# Patient Record
Sex: Male | Born: 1940 | Race: Black or African American | Hispanic: No | Marital: Married | Smoking: Former smoker
Health system: Southern US, Community
[De-identification: ages and names within clinical notes are randomized; demographics above are authoritative.]

## PROBLEM LIST (undated history)

## (undated) DIAGNOSIS — I10 Essential (primary) hypertension: Secondary | ICD-10-CM

## (undated) DIAGNOSIS — N400 Enlarged prostate without lower urinary tract symptoms: Secondary | ICD-10-CM

## (undated) DIAGNOSIS — Z9889 Other specified postprocedural states: Secondary | ICD-10-CM

## (undated) DIAGNOSIS — Z87891 Personal history of nicotine dependence: Secondary | ICD-10-CM

## (undated) DIAGNOSIS — M1990 Unspecified osteoarthritis, unspecified site: Secondary | ICD-10-CM

## (undated) DIAGNOSIS — D075 Carcinoma in situ of prostate: Secondary | ICD-10-CM

## (undated) HISTORY — DX: Benign prostatic hyperplasia without lower urinary tract symptoms: N40.0

## (undated) HISTORY — DX: Personal history of nicotine dependence: Z87.891

## (undated) HISTORY — DX: Other specified postprocedural states: Z98.890

## (undated) HISTORY — PX: HEMORROIDECTOMY: SUR656

## (undated) HISTORY — PX: HERNIA REPAIR: SHX51

## (undated) HISTORY — DX: Carcinoma in situ of prostate: D07.5

## (undated) HISTORY — PX: JOINT REPLACEMENT: SHX530

## (undated) HISTORY — DX: Unspecified osteoarthritis, unspecified site: M19.90

## (undated) HISTORY — PX: TOTAL HIP ARTHROPLASTY: SHX124

## (undated) HISTORY — DX: Essential (primary) hypertension: I10

---

## 2005-12-08 ENCOUNTER — Emergency Department: Payer: Self-pay | Admitting: Emergency Medicine

## 2008-06-10 ENCOUNTER — Ambulatory Visit: Payer: Self-pay | Admitting: Internal Medicine

## 2008-06-22 ENCOUNTER — Ambulatory Visit: Payer: Self-pay | Admitting: Internal Medicine

## 2008-07-11 ENCOUNTER — Ambulatory Visit: Payer: Self-pay | Admitting: Internal Medicine

## 2008-07-24 ENCOUNTER — Ambulatory Visit: Payer: Self-pay | Admitting: Gastroenterology

## 2008-08-11 ENCOUNTER — Ambulatory Visit: Payer: Self-pay | Admitting: Internal Medicine

## 2008-09-08 ENCOUNTER — Ambulatory Visit: Payer: Self-pay | Admitting: Internal Medicine

## 2008-09-10 ENCOUNTER — Ambulatory Visit: Payer: Self-pay | Admitting: Internal Medicine

## 2008-10-10 ENCOUNTER — Ambulatory Visit: Payer: Self-pay | Admitting: Internal Medicine

## 2009-02-08 ENCOUNTER — Ambulatory Visit: Payer: Self-pay | Admitting: Internal Medicine

## 2009-02-22 ENCOUNTER — Ambulatory Visit: Payer: Self-pay | Admitting: Internal Medicine

## 2009-03-10 ENCOUNTER — Ambulatory Visit: Payer: Self-pay | Admitting: Internal Medicine

## 2009-03-22 ENCOUNTER — Ambulatory Visit: Payer: Self-pay | Admitting: Internal Medicine

## 2009-04-10 ENCOUNTER — Ambulatory Visit: Payer: Self-pay | Admitting: Internal Medicine

## 2009-08-30 ENCOUNTER — Emergency Department: Payer: Self-pay | Admitting: Unknown Physician Specialty

## 2010-06-25 ENCOUNTER — Emergency Department: Payer: Self-pay | Admitting: Emergency Medicine

## 2011-11-11 DIAGNOSIS — Z9889 Other specified postprocedural states: Secondary | ICD-10-CM

## 2011-11-11 DIAGNOSIS — D075 Carcinoma in situ of prostate: Secondary | ICD-10-CM

## 2011-11-11 HISTORY — PX: PROSTATE BIOPSY: SHX241

## 2011-11-11 HISTORY — DX: Other specified postprocedural states: Z98.890

## 2011-11-11 HISTORY — DX: Carcinoma in situ of prostate: D07.5

## 2012-07-07 DIAGNOSIS — N401 Enlarged prostate with lower urinary tract symptoms: Secondary | ICD-10-CM | POA: Insufficient documentation

## 2012-07-07 DIAGNOSIS — R972 Elevated prostate specific antigen [PSA]: Secondary | ICD-10-CM | POA: Insufficient documentation

## 2012-07-30 ENCOUNTER — Emergency Department: Payer: Self-pay | Admitting: Internal Medicine

## 2012-07-30 LAB — COMPREHENSIVE METABOLIC PANEL
Albumin: 3.6 g/dL (ref 3.4–5.0)
Alkaline Phosphatase: 104 U/L (ref 50–136)
Anion Gap: 8 (ref 7–16)
Calcium, Total: 9 mg/dL (ref 8.5–10.1)
Co2: 27 mmol/L (ref 21–32)
Creatinine: 1.35 mg/dL — ABNORMAL HIGH (ref 0.60–1.30)
EGFR (African American): >60
EGFR (Non-African Amer.): 52 — ABNORMAL LOW
SGOT(AST): 29 U/L (ref 15–37)
Sodium: 137 mmol/L (ref 136–145)
Total Protein: 7.3 g/dL (ref 6.4–8.2)

## 2012-07-30 LAB — CBC
HCT: 38.4 % — ABNORMAL LOW (ref 40.0–52.0)
HGB: 13.1 g/dL (ref 13.0–18.0)
RBC: 4.23 10*6/uL — ABNORMAL LOW (ref 4.40–5.90)
WBC: 3 10*3/uL — ABNORMAL LOW (ref 3.8–10.6)

## 2012-07-30 LAB — TROPONIN I: Troponin-I: <0.02 ng/mL

## 2012-08-06 DIAGNOSIS — D075 Carcinoma in situ of prostate: Secondary | ICD-10-CM | POA: Insufficient documentation

## 2012-09-03 DIAGNOSIS — IMO0001 Reserved for inherently not codable concepts without codable children: Secondary | ICD-10-CM | POA: Insufficient documentation

## 2012-09-03 DIAGNOSIS — Z0189 Encounter for other specified special examinations: Secondary | ICD-10-CM | POA: Insufficient documentation

## 2012-09-03 DIAGNOSIS — D709 Neutropenia, unspecified: Secondary | ICD-10-CM | POA: Insufficient documentation

## 2013-04-11 ENCOUNTER — Ambulatory Visit: Payer: Self-pay | Admitting: Orthopedic Surgery

## 2013-04-11 DIAGNOSIS — Z01810 Encounter for preprocedural cardiovascular examination: Secondary | ICD-10-CM

## 2013-04-11 LAB — BASIC METABOLIC PANEL
BUN: 15 mg/dL (ref 7–18)
Calcium, Total: 8.8 mg/dL (ref 8.5–10.1)
Co2: 31 mmol/L (ref 21–32)
EGFR (Non-African Amer.): >60
Glucose: 135 mg/dL — ABNORMAL HIGH (ref 65–99)
Osmolality: 279 (ref 275–301)
Sodium: 138 mmol/L (ref 136–145)

## 2013-04-11 LAB — URINALYSIS, COMPLETE
Bacteria: NONE SEEN
Bilirubin,UR: NEGATIVE
Blood: NEGATIVE
Glucose,UR: NEGATIVE mg/dL (ref 0–75)
Ketone: NEGATIVE
Leukocyte Esterase: NEGATIVE
Nitrite: NEGATIVE
Protein: NEGATIVE
Squamous Epithelial: <1

## 2013-04-11 LAB — MRSA PCR SCREENING

## 2013-04-11 LAB — CBC
HCT: 39.8 % — ABNORMAL LOW (ref 40.0–52.0)
HGB: 14 g/dL (ref 13.0–18.0)
MCHC: 35.1 g/dL (ref 32.0–36.0)
MCV: 90 fL (ref 80–100)
Platelet: 219 10*3/uL (ref 150–440)
RBC: 4.43 10*6/uL (ref 4.40–5.90)
RDW: 13.8 % (ref 11.5–14.5)

## 2013-04-11 LAB — PROTIME-INR
INR: 1
Prothrombin Time: 13.2 secs (ref 11.5–14.7)

## 2013-04-28 ENCOUNTER — Inpatient Hospital Stay: Payer: Self-pay | Admitting: Orthopedic Surgery

## 2013-04-28 LAB — POTASSIUM: Potassium: 3.6 mmol/L (ref 3.5–5.1)

## 2013-04-29 LAB — BASIC METABOLIC PANEL
Anion Gap: 5 — ABNORMAL LOW (ref 7–16)
BUN: 11 mg/dL (ref 7–18)
Calcium, Total: 8.2 mg/dL — ABNORMAL LOW (ref 8.5–10.1)
Creatinine: 0.83 mg/dL (ref 0.60–1.30)
EGFR (African American): >60
Sodium: 136 mmol/L (ref 136–145)

## 2013-05-02 LAB — PATHOLOGY REPORT

## 2013-07-12 ENCOUNTER — Ambulatory Visit: Payer: Self-pay | Admitting: Urology

## 2013-07-18 DIAGNOSIS — R053 Chronic cough: Secondary | ICD-10-CM | POA: Insufficient documentation

## 2013-07-18 DIAGNOSIS — R05 Cough: Secondary | ICD-10-CM | POA: Insufficient documentation

## 2013-07-18 DIAGNOSIS — R9389 Abnormal findings on diagnostic imaging of other specified body structures: Secondary | ICD-10-CM | POA: Insufficient documentation

## 2015-03-02 NOTE — Op Note (Signed)
PATIENT NAME:  [name] Foster, [name] Foster MR#:  [id number] DATE OF BIRTH:  02-03-1941  DATE OF PROCEDURE:  04/28/2013  PREOPERATIVE DIAGNOSIS: Severe left hip osteoarthritis.   POSTOPERATIVE DIAGNOSIS: Severe left hip osteoarthritis.   PROCEDURE: Left total hip replacement.   ANESTHESIA: Spinal.   SURGEON: Laurene Footman, M.D.   ASSISTANT:  Francena Hanly, Nurse Practitioner.   DESCRIPTION OF PROCEDURE: Patient was brought to the operating room and after adequate anesthesia was obtained, the patient was transferred to the operative table with the left foot in the Medacta attachment, right leg in the well legholder. C-arm was brought in and initial x-ray of the left hip obtained with slight traction. The hip was then prepped and draped in the usual sterile fashion with anterior approach being utilized, centered over the tensor fascia lata muscle. After patient identification and timeout procedures were completed. Skin incision was made tensor fascia was incised and the muscle retracted laterally. The anterior circumflex vessels were tied off and the rectus retracted medially. The anterior capsule is then opened with a flap and the femoral neck was cut. The hip was quite stiff and after getting the head out, there was a significant degenerative changes present. Labrum excised along with some inferior spurs. Sequential reaming was carried out to 56 mm at which point there was a good bleeding tissue in the acetabulum. The 56 trial fit well. The 56 cup was impacted and subsequently was readjusted as it appeared somewhat a vertical on x-ray. Making some fine adjustments, good position was obtained. Next, the posterior capsule was quite tight and release of the pubofemoral and ischiofemoral ligaments was carried out to allow for external rotation and then extension of the leg with abduction. A box osteotome and curette were used to start opening the canal followed by broaches and the #3 stem gave a very nice fit on  x-ray and on exam. Trials were placed and with the short head, leg lengths appeared to be similar to the preop. The final stem was then impacted and repeat trial carried out and the final components assembled and impacted. X-ray at this point showed very good limb alignment with appropriate offset and component position. The wound was thoroughly irrigated. The capsule was repaired using Ethibond suture and heavy quill for the deep tissue with local anesthetic being infiltrated 0.5% Sensorcaine. A subcutaneous drain followed by 2 - 0 quill and skin staples. Xeroform, 4 x 4's, ABD and tape applied. The patient was sent to the recovery room in stable condition.   ESTIMATED BLOOD LOSS: 700.   COMPLICATIONS: None.   SPECIMENS: Femoral head.   IMPLANTS: Medacta AMIS stem size 3 standard, a 56 mm VersaFit cup DM with appropriate DM liner and a size S 28 mm femoral head.     ____________________________ Laurene Footman, MD mjm:cc D: 04/28/2013 17:37:07 ET T: 04/28/2013 20:16:28 ET JOB#: [id number]  cc: Laurene Footman, MD, <Dictator> Laurene Footman MD ELECTRONICALLY SIGNED 04/29/2013 6:34

## 2015-03-02 NOTE — Discharge Summary (Signed)
PATIENT NAME:  [name] Foster, [name] Foster MR#:  [id number] DATE OF BIRTH:  November 10, 1941  DATE OF ADMISSION:  04/28/2013 DATE OF DISCHARGE:  05/01/2013  ADMITTING DIAGNOSIS: Left hip degenerative arthritis.   DISCHARGE DIAGNOSIS: Left hip degenerative arthritis.   OPERATION: On 04/28/2013, he had a left total hip arthroplasty.   SURGEON: Dr. Hessie Knows.   ANESTHESIA: Spinal.   ESTIMATED BLOOD LOSS: 700 mL.   DRAINS: Hemovac.  IMPLANTS:  Medacta 3 AMIS stem, 56 mm Versafit DM cup and liner, S 28 mm head.   COMPLICATIONS: None.   HISTORY: [name] Foster is a 74 year old African American male who failed conservative measures for treatment of left degenerative arthritis. He elected to proceed with a left total hip arthroplasty.   PHYSICAL EXAMINATION: LUNGS: Clear to auscultation.  HEART: Regular rate and rhythm.  HEENT: Normal.  MUSCULOSKELETAL: 0 degree internal rotation, 10 degree external rotation of the hip with slight flexion contracture. Distally, he is neurovascularly intact with strong pulses and no edema.   HOSPITAL COURSE: After initial admission on 04/28/2013, he underwent a left total hip arthroplasty, anterior approach, the same day. He had good pain control afterwards and was brought to the orthopedic floor from the PACU. His initial hemoglobin on postoperative day one, 04/29/2013, was 10.7. Physical therapy was begun on that day and he actually was able to ambulate 200 feet that day with little pain. On postoperative day two, 04/30/2013, he continued to progress well with physical therapy. He had not had a bowel movement yet. His daughter was present and did not want him being discharged because he had a problem with pain control initially. Decision was made to keep him 1 more night for additional physical therapy sessions and pain control. On postoperative day three, 05/01/2013, he was stable and ready for discharge.   CONDITION AT DISCHARGE: Stable.   DISPOSITION: The patient  was sent to rehab.   DISCHARGE INSTRUCTIONS: The patient will follow up in [facility] orthopedics in 2 weeks for staple removal. He will do home health physical therapy and may be weight-bearing as tolerated on the left lower extremity. TED hose knee-high bilaterally. Dressing can be changed once daily and on an as-needed basis.   DISCHARGE MEDICATIONS: 1.  Doxazosin 4 mg oral tablet 1 tab p.o. once daily.  2.  Finasteride 5 mg 1 tab p.o. once daily.  3.  Centrum Silver multivitamin 1 tab p.o. once daily. 4.  Acetaminophen 500 mg 1 tab p.o. q. 4 hours p.r.n. pain or temperature greater than 100.4.  5.  Oxycodone 5 mg 1 tab p.o. q. 4 hours p.r.n. pain.  6.  Tramadol 50 mg 1 tab p.o. q. 8 hours p.r.n. pain.  7.  Magnesium hydroxide 8% oral suspension 30 mL p.o. b.i.d. p.r.n. constipation.  8.  Aluminum hydroxide/magnesium hydroxide/simethicone 400/400/40/5 mL 30 mL p.o. q. 6 hours p.r.n. indigestion or heartburn.  9.  Rivaroxaban 10 mg 1 tab p.o. q. a.m. x 9 days.  10.  Docusate/senna 50 mg/8.6 mg 1 tab p.o. b.i.d.  11.  Bisacodyl 10 mg rectal suppository 1 suppository once daily p.r.n. constipation. ____________________________ Yusef Lamp M. Tretha Sciara, NP amb:sb D: 05/02/2013 08:45:47 ET T: 05/02/2013 09:29:30 ET JOB#: [id number]  cc: Adena Sima M. Tretha Sciara, NP, <Dictator> Kem Kays Ayriel Texidor FNP ELECTRONICALLY SIGNED 05/17/2013 13:05

## 2015-04-25 DIAGNOSIS — Z125 Encounter for screening for malignant neoplasm of prostate: Secondary | ICD-10-CM | POA: Diagnosis not present

## 2015-04-25 DIAGNOSIS — N401 Enlarged prostate with lower urinary tract symptoms: Secondary | ICD-10-CM | POA: Diagnosis not present

## 2015-04-25 DIAGNOSIS — Z0000 Encounter for general adult medical examination without abnormal findings: Secondary | ICD-10-CM | POA: Diagnosis not present

## 2015-04-27 DIAGNOSIS — Z0000 Encounter for general adult medical examination without abnormal findings: Secondary | ICD-10-CM | POA: Diagnosis not present

## 2015-04-27 DIAGNOSIS — Z125 Encounter for screening for malignant neoplasm of prostate: Secondary | ICD-10-CM | POA: Diagnosis not present

## 2015-05-30 DIAGNOSIS — Z6829 Body mass index (BMI) 29.0-29.9, adult: Secondary | ICD-10-CM | POA: Diagnosis not present

## 2015-05-30 DIAGNOSIS — D075 Carcinoma in situ of prostate: Secondary | ICD-10-CM | POA: Diagnosis not present

## 2015-05-30 DIAGNOSIS — R339 Retention of urine, unspecified: Secondary | ICD-10-CM | POA: Diagnosis not present

## 2015-05-30 DIAGNOSIS — N401 Enlarged prostate with lower urinary tract symptoms: Secondary | ICD-10-CM | POA: Diagnosis not present

## 2015-05-30 DIAGNOSIS — R972 Elevated prostate specific antigen [PSA]: Secondary | ICD-10-CM | POA: Diagnosis not present

## 2015-10-29 DIAGNOSIS — N529 Male erectile dysfunction, unspecified: Secondary | ICD-10-CM | POA: Insufficient documentation

## 2015-10-29 DIAGNOSIS — N401 Enlarged prostate with lower urinary tract symptoms: Secondary | ICD-10-CM | POA: Diagnosis not present

## 2015-11-15 ENCOUNTER — Ambulatory Visit
Admission: EM | Admit: 2015-11-15 | Discharge: 2015-11-15 | Disposition: A | Discharge status: Home / Self Care | Payer: Commercial Managed Care - HMO | Attending: Family Medicine | Admitting: Family Medicine

## 2015-11-15 ENCOUNTER — Encounter: Payer: Self-pay | Admitting: Emergency Medicine

## 2015-11-15 DIAGNOSIS — Z024 Encounter for examination for driving license: Secondary | ICD-10-CM

## 2015-11-15 DIAGNOSIS — Z029 Encounter for administrative examinations, unspecified: Secondary | ICD-10-CM

## 2015-11-15 LAB — DEPT OF TRANSP DIPSTICK, URINE (ARMC ONLY)
Glucose, UA: NEGATIVE mg/dL
Hgb urine dipstick: NEGATIVE
PROTEIN: NEGATIVE mg/dL
Specific Gravity, Urine: 1.02 (ref 1.005–1.030)

## 2015-11-15 NOTE — ED Notes (Signed)
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

## 2015-11-15 NOTE — ED Provider Notes (Signed)
CSN: [id number]     Arrival date & time 11/15/15  [id number] History   First MD Initiated Contact with Patient 11/15/15 1028     Chief Complaint  Patient presents with  . 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 He surprisingly is in very good shape and has BPH on medications. He denies all other medical problems including cardiac, blood pressure issues, obstructive sleep apnea, excessive sleep etc. He does wear glasses for driving.  No past medical history on file. No past surgical history on file. History reviewed. No pertinent family history. Social History  Substance Use Topics  . Smoking status: Never Smoker   . Smokeless tobacco: None  . Alcohol Use: No    Review of Systems  Allergies  Review of patient's allergies indicates no known allergies.  Home Medications   Prior to Admission medications   Medication Sig Start Date End Date Taking? Authorizing Provider  doxazosin (CARDURA) 4 MG tablet Take 4 mg by mouth daily.   Yes Historical Provider, MD  finasteride (PROSCAR) 5 MG tablet Take 5 mg by mouth daily.   Yes Historical Provider, MD  multivitamin-iron-minerals-folic acid (CENTRUM) chewable tablet Chew 1 tablet by mouth daily.   Yes Historical Provider, MD   Meds Ordered and Administered this Visit  Medications - No data to display  BP 139/82 mmHg  Pulse 75  Temp(Src) 96.8 F (36 C) (Tympanic)  Resp 16  Ht 5' 8.5" (1.74 m)  Wt 199 lb 4.8 oz (90.402 kg)  BMI 29.86 kg/m2  SpO2 99% No data found.   Physical Exam  ED Course  Procedures (including critical care time)  Labs Review Labs Reviewed  DEPT OF TRANSP DIPSTICK, URINE([facility] ONLY)    Imaging Review No results  found.   Visual Acuity Review  Right Eye Distance:   Left Eye Distance:   Bilateral Distance:    Right Eye Near:   Left Eye Near:    Bilateral Near:         MDM   1. Driver's permit physical examination        Lorin Picket, PA-C 11/15/15 1054

## 2015-12-21 DIAGNOSIS — N401 Enlarged prostate with lower urinary tract symptoms: Secondary | ICD-10-CM | POA: Diagnosis not present

## 2015-12-21 DIAGNOSIS — R972 Elevated prostate specific antigen [PSA]: Secondary | ICD-10-CM | POA: Diagnosis not present

## 2015-12-21 DIAGNOSIS — Z6829 Body mass index (BMI) 29.0-29.9, adult: Secondary | ICD-10-CM | POA: Diagnosis not present

## 2015-12-21 DIAGNOSIS — D075 Carcinoma in situ of prostate: Secondary | ICD-10-CM | POA: Diagnosis not present

## 2016-04-28 DIAGNOSIS — Z125 Encounter for screening for malignant neoplasm of prostate: Secondary | ICD-10-CM | POA: Diagnosis not present

## 2016-04-28 DIAGNOSIS — Z0000 Encounter for general adult medical examination without abnormal findings: Secondary | ICD-10-CM | POA: Diagnosis not present

## 2016-07-31 DIAGNOSIS — R972 Elevated prostate specific antigen [PSA]: Secondary | ICD-10-CM | POA: Diagnosis not present

## 2016-07-31 DIAGNOSIS — N401 Enlarged prostate with lower urinary tract symptoms: Secondary | ICD-10-CM | POA: Diagnosis not present

## 2016-07-31 DIAGNOSIS — D075 Carcinoma in situ of prostate: Secondary | ICD-10-CM | POA: Diagnosis not present

## 2016-07-31 DIAGNOSIS — Z6829 Body mass index (BMI) 29.0-29.9, adult: Secondary | ICD-10-CM | POA: Diagnosis not present

## 2016-08-25 DIAGNOSIS — R69 Illness, unspecified: Secondary | ICD-10-CM | POA: Diagnosis not present

## 2016-10-28 DIAGNOSIS — K5900 Constipation, unspecified: Secondary | ICD-10-CM | POA: Diagnosis not present

## 2016-10-28 DIAGNOSIS — H2513 Age-related nuclear cataract, bilateral: Secondary | ICD-10-CM | POA: Diagnosis not present

## 2016-10-28 DIAGNOSIS — Z125 Encounter for screening for malignant neoplasm of prostate: Secondary | ICD-10-CM | POA: Diagnosis not present

## 2017-02-03 DIAGNOSIS — R972 Elevated prostate specific antigen [PSA]: Secondary | ICD-10-CM | POA: Diagnosis not present

## 2017-02-03 DIAGNOSIS — R339 Retention of urine, unspecified: Secondary | ICD-10-CM | POA: Insufficient documentation

## 2017-02-03 DIAGNOSIS — Z6829 Body mass index (BMI) 29.0-29.9, adult: Secondary | ICD-10-CM | POA: Diagnosis not present

## 2017-02-03 DIAGNOSIS — D075 Carcinoma in situ of prostate: Secondary | ICD-10-CM | POA: Diagnosis not present

## 2017-02-03 DIAGNOSIS — N401 Enlarged prostate with lower urinary tract symptoms: Secondary | ICD-10-CM | POA: Diagnosis not present

## 2017-05-05 DIAGNOSIS — Z79899 Other long term (current) drug therapy: Secondary | ICD-10-CM | POA: Diagnosis not present

## 2017-05-05 DIAGNOSIS — Z862 Personal history of diseases of the blood and blood-forming organs and certain disorders involving the immune mechanism: Secondary | ICD-10-CM | POA: Diagnosis not present

## 2017-05-05 DIAGNOSIS — Z87898 Personal history of other specified conditions: Secondary | ICD-10-CM | POA: Diagnosis not present

## 2017-05-05 DIAGNOSIS — M25512 Pain in left shoulder: Secondary | ICD-10-CM | POA: Diagnosis not present

## 2017-05-05 DIAGNOSIS — Z13220 Encounter for screening for lipoid disorders: Secondary | ICD-10-CM | POA: Diagnosis not present

## 2017-05-05 DIAGNOSIS — Z0000 Encounter for general adult medical examination without abnormal findings: Secondary | ICD-10-CM | POA: Diagnosis not present

## 2017-05-05 DIAGNOSIS — D709 Neutropenia, unspecified: Secondary | ICD-10-CM | POA: Diagnosis not present

## 2017-05-05 DIAGNOSIS — N401 Enlarged prostate with lower urinary tract symptoms: Secondary | ICD-10-CM | POA: Diagnosis not present

## 2017-05-05 DIAGNOSIS — L989 Disorder of the skin and subcutaneous tissue, unspecified: Secondary | ICD-10-CM | POA: Diagnosis not present

## 2017-05-14 DIAGNOSIS — Z0000 Encounter for general adult medical examination without abnormal findings: Secondary | ICD-10-CM | POA: Diagnosis not present

## 2017-05-14 DIAGNOSIS — Z125 Encounter for screening for malignant neoplasm of prostate: Secondary | ICD-10-CM | POA: Diagnosis not present

## 2017-05-14 DIAGNOSIS — Z862 Personal history of diseases of the blood and blood-forming organs and certain disorders involving the immune mechanism: Secondary | ICD-10-CM | POA: Diagnosis not present

## 2017-05-14 DIAGNOSIS — Z87898 Personal history of other specified conditions: Secondary | ICD-10-CM | POA: Diagnosis not present

## 2017-05-14 DIAGNOSIS — Z13220 Encounter for screening for lipoid disorders: Secondary | ICD-10-CM | POA: Diagnosis not present

## 2017-05-14 DIAGNOSIS — Z79899 Other long term (current) drug therapy: Secondary | ICD-10-CM | POA: Diagnosis not present

## 2017-06-04 ENCOUNTER — Inpatient Hospital Stay: Payer: Medicare HMO

## 2017-06-04 ENCOUNTER — Inpatient Hospital Stay: Payer: Medicare HMO | Attending: Oncology | Admitting: Oncology

## 2017-06-04 ENCOUNTER — Encounter: Payer: Self-pay | Admitting: Oncology

## 2017-06-04 VITALS — BP 133/79 | HR 67 | Temp 97.3°F | Resp 16 | Wt 190.7 lb

## 2017-06-04 DIAGNOSIS — D709 Neutropenia, unspecified: Secondary | ICD-10-CM | POA: Insufficient documentation

## 2017-06-04 DIAGNOSIS — M25512 Pain in left shoulder: Secondary | ICD-10-CM | POA: Insufficient documentation

## 2017-06-04 DIAGNOSIS — N400 Enlarged prostate without lower urinary tract symptoms: Secondary | ICD-10-CM | POA: Insufficient documentation

## 2017-06-04 DIAGNOSIS — Z87891 Personal history of nicotine dependence: Secondary | ICD-10-CM | POA: Insufficient documentation

## 2017-06-04 DIAGNOSIS — Z8042 Family history of malignant neoplasm of prostate: Secondary | ICD-10-CM | POA: Diagnosis not present

## 2017-06-04 DIAGNOSIS — Z79899 Other long term (current) drug therapy: Secondary | ICD-10-CM | POA: Insufficient documentation

## 2017-06-04 LAB — COMPREHENSIVE METABOLIC PANEL
ALT: 27 U/L (ref 17–63)
AST: 33 U/L (ref 15–41)
Albumin: 4.3 g/dL (ref 3.5–5.0)
Alkaline Phosphatase: 109 U/L (ref 38–126)
Anion gap: 8 (ref 5–15)
BILIRUBIN TOTAL: 0.8 mg/dL (ref 0.3–1.2)
BUN: 15 mg/dL (ref 6–20)
CO2: 27 mmol/L (ref 22–32)
CREATININE: 0.97 mg/dL (ref 0.61–1.24)
Calcium: 9.3 mg/dL (ref 8.9–10.3)
Chloride: 100 mmol/L — ABNORMAL LOW (ref 101–111)
Glucose, Bld: 77 mg/dL (ref 65–99)
POTASSIUM: 3.9 mmol/L (ref 3.5–5.1)
Sodium: 135 mmol/L (ref 135–145)
TOTAL PROTEIN: 8.1 g/dL (ref 6.5–8.1)

## 2017-06-04 LAB — CBC WITH DIFFERENTIAL/PLATELET
BASOS ABS: 0 10*3/uL (ref 0–0.1)
Basophils Relative: 1 %
Eosinophils Absolute: 0.1 10*3/uL (ref 0–0.7)
Eosinophils Relative: 3 %
HEMATOCRIT: 39.3 % — AB (ref 40.0–52.0)
Hemoglobin: 13.3 g/dL (ref 13.0–18.0)
LYMPHS ABS: 1.4 10*3/uL (ref 1.0–3.6)
LYMPHS PCT: 46 %
MCH: 30.5 pg (ref 26.0–34.0)
MCHC: 33.9 g/dL (ref 32.0–36.0)
MCV: 90 fL (ref 80.0–100.0)
MONO ABS: 0.3 10*3/uL (ref 0.2–1.0)
MONOS PCT: 9 %
NEUTROS ABS: 1.3 10*3/uL — AB (ref 1.4–6.5)
Neutrophils Relative %: 41 %
Platelets: 226 10*3/uL (ref 150–440)
RBC: 4.36 MIL/uL — ABNORMAL LOW (ref 4.40–5.90)
RDW: 13.6 % (ref 11.5–14.5)
WBC: 3.1 10*3/uL — ABNORMAL LOW (ref 3.8–10.6)

## 2017-06-04 LAB — VITAMIN B12: VITAMIN B 12: 605 pg/mL (ref 180–914)

## 2017-06-04 LAB — TSH: TSH: 3.06 u[IU]/mL (ref 0.350–4.500)

## 2017-06-04 NOTE — Progress Notes (Signed)
[facility] Initial Visit:  Patient Care Team: Alanson Aly, [place] as PCP - General (Family Medicine)  CHIEF COMPLAINTS/PURPOSE OF CONSULTATION:   No history exists.    HISTORY OF PRESENTING ILLNESS: [name] Foster 76 y.o. 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  Patient was accompanied by his wife today. Patient denies any complaint he fails at his baseline health. Denies any night sweats or fever or weight loss. Denies any chest pain, shortness of breath, abdominal pain, or leg swelling. He denies any frequent upper respiratory infections. Patient had some recent labs done with [facility].  he has left shoulder pain for which he had seen PCP and was recommended to see orthopedic surgeon. patient declined. He was then advised to start shoulder exercise and be reassessed.  05/14/2017, hemoglobin 12.9 WBC 2.8 MCV 89.5 platelet 207. ANC 0.9Creatinine 1 bilirubin 0.6 AST 25 ALT 27, alkaline phosphatase In the past,his wbc has been chronically low, ranges from 12.9-13.3. ANC ranges from 0.9-1.9.  Review of Systems  Constitutional: Negative.   HENT:  Negative.   Eyes: Negative.   Respiratory: Negative.   Cardiovascular: Negative.   Gastrointestinal: Negative.   Endocrine: Negative.   Genitourinary: Negative.    Musculoskeletal: Positive for arthralgias.       Left shoulder pain.   Skin: Negative.   Neurological: Negative.   Hematological: Negative.   Psychiatric/Behavioral: Negative.     MEDICAL HISTORY: Past Medical History:  Diagnosis Date  . BPH (benign prostatic hyperplasia)   . Former smoker, stopped smoking many years ago   . History of needle biopsy of prostate with negative result 2013  . Prostatic intraepithelial neoplasm III 2013    SURGICAL HISTORY: Past Surgical History:  Procedure Laterality Date  . HEMORROIDECTOMY    . HERNIA REPAIR    . PROSTATE BIOPSY  2013   . TOTAL HIP ARTHROPLASTY Left     SOCIAL HISTORY: Social History   Social History  . Marital status: Married    Spouse name: N/A  . Number of children: N/A  . Years of education: N/A   Occupational History  . Not on file.   Social History Main Topics  . Smoking status: Former Smoker    Quit date: 06/04/1980  . Smokeless tobacco: Never Used  . Alcohol use No  . Drug use: No  . Sexual activity: Not on file   Other Topics Concern  . Not on file   Social History Narrative  . No narrative on file    FAMILY HISTORY Family History  Problem Relation Age of Onset  . Prostate cancer Father   . Prostate cancer Brother     ALLERGIES:  has No Known Allergies.  MEDICATIONS:  Current Outpatient Prescriptions  Medication Sig Dispense Refill  . Acetaminophen 500 MG coapsule     . doxazosin (CARDURA) 4 MG tablet Take 4 mg by mouth daily.    . finasteride (PROSCAR) 5 MG tablet Take 5 mg by mouth daily.    . multivitamin-iron-minerals-folic acid (CENTRUM) chewable tablet Chew 1 tablet by mouth daily.     No current facility-administered medications for this visit.     PHYSICAL EXAMINATION:  ECOG PERFORMANCE STATUS: 0 - Asymptomatic   Vitals:   06/04/17 1120  BP: 133/79  Pulse: 67  Resp: 16  Temp: (!) 97.3 F (36.3 C)    Filed Weights   06/04/17 1120  Weight: 190 lb  11.2 oz (86.5 kg)   Physical Exam GENERAL: No distress, well nourished.  SKIN:  No rashes or significant lesions  HEAD: Normocephalic, No masses, lesions, tenderness or abnormalities  EYES: Conjunctiva are pink, non icteric ENT: External ears normal ,lips , buccal mucosa, and tongue normal and mucous membranes are moist  LYMPH: No palpable cervical and axillary lymphadenopathy  LUNGS: Clear to auscultation, no crackles or wheezes HEART: Regular rate & rhythm, no murmurs, no gallops, S1 normal and S2 normal  ABDOMEN: Abdomen soft, non-tender, normal bowel sounds, I did not appreciate any  masses or  organomegaly  MUSCULOSKELETAL: No CVA tenderness and no tenderness on percussion of the back or rib cage.  EXTREMITIES: No edema, no skin discoloration or tenderness NEURO: Alert & oriented, no focal motor/sensory deficits.    LABORATORY DATA: I have personally reviewed the data as listed:  Appointment on 06/04/2017  Component Date Value Ref Range Status  . WBC 06/04/2017 3.1* 3.8 - 10.6 K/uL Final  . RBC 06/04/2017 4.36* 4.40 - 5.90 MIL/uL Final  . Hemoglobin 06/04/2017 13.3  13.0 - 18.0 g/dL Final  . HCT 06/04/2017 39.3* 40.0 - 52.0 % Final  . MCV 06/04/2017 90.0  80.0 - 100.0 fL Final  . MCH 06/04/2017 30.5  26.0 - 34.0 pg Final  . MCHC 06/04/2017 33.9  32.0 - 36.0 g/dL Final  . RDW 06/04/2017 13.6  11.5 - 14.5 % Final  . Platelets 06/04/2017 226  150 - 440 K/uL Final  . Neutrophils Relative % 06/04/2017 41  % Final  . Neutro Abs 06/04/2017 1.3* 1.4 - 6.5 K/uL Final  . Lymphocytes Relative 06/04/2017 46  % Final  . Lymphs Abs 06/04/2017 1.4  1.0 - 3.6 K/uL Final  . Monocytes Relative 06/04/2017 9  % Final  . Monocytes Absolute 06/04/2017 0.3  0.2 - 1.0 K/uL Final  . Eosinophils Relative 06/04/2017 3  % Final  . Eosinophils Absolute 06/04/2017 0.1  0 - 0.7 K/uL Final  . Basophils Relative 06/04/2017 1  % Final  . Basophils Absolute 06/04/2017 0.0  0 - 0.1 K/uL Final    RADIOGRAPHIC STUDIES: I have personally reviewed the radiological images as listed and agree with the findings in the report No recent image studies.  No results found.  ASSESSMENT/PLAN 1. Neutropenia, unspecified type ([place])    I discussed with patient and his wife that the differential diagnosis for neutropenia broad. I reviewed his medication. Doxazosin can cause neutropenia, but that's very rare, usually less than 1% chance.  Benign ethnic neutropenia is also possible given patient's ethnic group as African descent. We will have some lab work done to rule out other etiologies. We will check CBC with  differential, CMP, B12, TSH, pathology smear review, hepatitis panel, HIV antibody, ANA.  patient agrees with the plan. We will see patient back in 2 weeks to discuss lab results.  Orders Placed This Encounter  Procedures  . CBC with Differential/Platelet    Standing Status:   Future    Number of Occurrences:   1    Standing Expiration Date:   06/04/2018  . Comprehensive metabolic panel    Standing Status:   Future    Number of Occurrences:   1    Standing Expiration Date:   06/04/2018  . Vitamin B12    Standing Status:   Future    Number of Occurrences:   1    Standing Expiration Date:   07/05/2017  . TSH    Standing Status:  Future    Number of Occurrences:   1    Standing Expiration Date:   07/05/2017  . Hepatic function panel    Standing Status:   Future    Number of Occurrences:   1    Standing Expiration Date:   06/04/2018  . Pathologist smear review    Standing Status:   Future    Number of Occurrences:   1    Standing Expiration Date:   07/05/2017  . HIV antibody    Standing Status:   Future    Number of Occurrences:   1    Standing Expiration Date:   06/04/2018  . ANA w/Reflex if Positive    Standing Status:   Future    Number of Occurrences:   1    Standing Expiration Date:   06/04/2018    All questions were answered. The patient knows to call the clinic with any problems, questions or concerns. Thank you for this kind referral and the opportunity to participate in the care of this patient. A copy of today's note will be routed to referring physician Dr.Geyer.    Earlie Server, MD  06/04/2017 2:46 PM

## 2017-06-04 NOTE — Progress Notes (Signed)
Patient here today as a new patient  

## 2017-06-05 LAB — HIV ANTIBODY (ROUTINE TESTING W REFLEX): HIV Screen 4th Generation wRfx: NONREACTIVE

## 2017-06-05 LAB — PATHOLOGIST SMEAR REVIEW

## 2017-06-05 LAB — ANA W/REFLEX IF POSITIVE: Anti Nuclear Antibody(ANA): NEGATIVE

## 2017-06-18 ENCOUNTER — Inpatient Hospital Stay: Payer: Medicare HMO | Attending: Oncology | Admitting: Oncology

## 2017-06-18 ENCOUNTER — Ambulatory Visit: Payer: Self-pay | Admitting: Oncology

## 2017-06-18 ENCOUNTER — Encounter: Payer: Self-pay | Admitting: Oncology

## 2017-06-18 ENCOUNTER — Inpatient Hospital Stay: Payer: Medicare HMO

## 2017-06-18 VITALS — BP 134/72 | HR 67 | Temp 96.7°F | Wt 192.5 lb

## 2017-06-18 DIAGNOSIS — Z79899 Other long term (current) drug therapy: Secondary | ICD-10-CM | POA: Diagnosis not present

## 2017-06-18 DIAGNOSIS — D709 Neutropenia, unspecified: Secondary | ICD-10-CM | POA: Diagnosis not present

## 2017-06-18 DIAGNOSIS — Z8042 Family history of malignant neoplasm of prostate: Secondary | ICD-10-CM | POA: Diagnosis not present

## 2017-06-18 DIAGNOSIS — Z87891 Personal history of nicotine dependence: Secondary | ICD-10-CM | POA: Diagnosis not present

## 2017-06-18 DIAGNOSIS — N400 Enlarged prostate without lower urinary tract symptoms: Secondary | ICD-10-CM | POA: Diagnosis not present

## 2017-06-18 DIAGNOSIS — D708 Other neutropenia: Secondary | ICD-10-CM

## 2017-06-18 DIAGNOSIS — M25512 Pain in left shoulder: Secondary | ICD-10-CM | POA: Insufficient documentation

## 2017-06-18 LAB — CBC WITH DIFFERENTIAL/PLATELET
Basophils Absolute: 0 10*3/uL (ref 0–0.1)
Basophils Relative: 1 %
EOS ABS: 0.1 10*3/uL (ref 0–0.7)
Eosinophils Relative: 4 %
HEMATOCRIT: 38.4 % — AB (ref 40.0–52.0)
HEMOGLOBIN: 13.1 g/dL (ref 13.0–18.0)
LYMPHS ABS: 1.4 10*3/uL (ref 1.0–3.6)
LYMPHS PCT: 47 %
MCH: 30.8 pg (ref 26.0–34.0)
MCHC: 34.1 g/dL (ref 32.0–36.0)
MCV: 90.4 fL (ref 80.0–100.0)
MONOS PCT: 10 %
Monocytes Absolute: 0.3 10*3/uL (ref 0.2–1.0)
NEUTROS ABS: 1.2 10*3/uL — AB (ref 1.4–6.5)
NEUTROS PCT: 38 %
Platelets: 211 10*3/uL (ref 150–440)
RBC: 4.25 MIL/uL — AB (ref 4.40–5.90)
RDW: 13.6 % (ref 11.5–14.5)
WBC: 3 10*3/uL — AB (ref 3.8–10.6)

## 2017-06-18 NOTE — Progress Notes (Signed)
[facility] Cancer Initial Visit:  Patient Care Team: Geyer, Katherine, FNP as PCP - General (Family Medicine)  CHIEF COMPLAINTS/PURPOSE OF CONSULTATION: Low blood counts.   HISTORY OF PRESENTING ILLNESS: [name] Foster 76 y.o. 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  Patient was accompanied by his wife today. Patient denies any complaint he fails at his baseline health. Denies any night sweats or fever or weight loss. Denies any chest pain, shortness of breath, abdominal pain, or leg swelling. He denies any frequent upper respiratory infections. Patient had some recent labs done with [facility].  he has left shoulder pain for which he had seen PCP and was recommended to see orthopedic surgeon. patient declined. He was then advised to start shoulder exercise and be reassessed.  05/14/2017, hemoglobin 12.9 WBC 2.8 MCV 89.5 platelet 207. ANC 0.9Creatinine 1 bilirubin 0.6 AST 25 ALT 27, alkaline phosphatase In the past,his wbc has been chronically low, ranges from 12.9-13.3. ANC ranges from 0.9-1.9.  INTERVAL HISTORY Patient presents today to discuss lab results. He has no complaints. He denies any frequent infections, fever or chill.s Feels well at baseline.    Review of Systems  Constitutional: Negative.   HENT:  Negative.   Eyes: Negative.   Respiratory: Negative.   Cardiovascular: Negative.   Gastrointestinal: Negative.   Endocrine: Negative.   Genitourinary: Negative.    Musculoskeletal: Positive for arthralgias.       Left shoulder pain.   Skin: Negative.   Neurological: Negative.   Hematological: Negative.   Psychiatric/Behavioral: Negative.     MEDICAL HISTORY: Past Medical History:  Diagnosis Date  . BPH (benign prostatic hyperplasia)   . Former smoker, stopped smoking many years ago   . History of needle biopsy of prostate with negative result 2013  . Prostatic intraepithelial  neoplasm III 2013    SURGICAL HISTORY: Past Surgical History:  Procedure Laterality Date  . HEMORROIDECTOMY    . HERNIA REPAIR    . PROSTATE BIOPSY  2013  . TOTAL HIP ARTHROPLASTY Left     SOCIAL HISTORY: Social History   Social History  . Marital status: Married    Spouse name: N/A  . Number of children: N/A  . Years of education: N/A   Occupational History  . Not on file.   Social History Main Topics  . Smoking status: Former Smoker    Quit date: 06/04/1980  . Smokeless tobacco: Never Used  . Alcohol use No  . Drug use: No  . Sexual activity: Not on file   Other Topics Concern  . Not on file   Social History Narrative  . No narrative on file    FAMILY HISTORY Family History  Problem Relation Age of Onset  . Prostate cancer Father   . Prostate cancer Brother     ALLERGIES:  has No Known Allergies.  MEDICATIONS:  Current Outpatient Prescriptions  Medication Sig Dispense Refill  . Acetaminophen 500 MG coapsule     . doxazosin (CARDURA) 4 MG tablet Take 4 mg by mouth daily.    . finasteride (PROSCAR) 5 MG tablet Take 5 mg by mouth daily.    . multivitamin-iron-minerals-folic acid (CENTRUM) chewable tablet Chew 1 tablet by mouth daily.     No current facility-administered medications for this visit.     PHYSICAL EXAMINATION:  ECOG PERFORMANCE STATUS: 0 - Asymptomatic   Vitals:   06/18/17 0836  BP: 134/72    Pulse: 67  Temp: (!) 96.7 F (35.9 C)    Filed Weights   06/18/17 0836  Weight: 192 lb 7.4 oz (87.3 kg)   Physical Exam GENERAL: No distress, well nourished.  SKIN:  No rashes or significant lesions  HEAD: Normocephalic, No masses, lesions, tenderness or abnormalities  EYES: Conjunctiva are pink, non icteric ENT: External ears normal ,lips , buccal mucosa, and tongue normal and mucous membranes are moist  LYMPH: No palpable cervical and axillary lymphadenopathy  LUNGS: Clear to auscultation, no crackles or wheezes HEART: Regular rate &  rhythm, no murmurs, no gallops, S1 normal and S2 normal  ABDOMEN: Abdomen soft, non-tender, normal bowel sounds, I did not appreciate any  masses or organomegaly  MUSCULOSKELETAL: No CVA tenderness and no tenderness on percussion of the back or rib cage.  EXTREMITIES: No edema, no skin discoloration or tenderness NEURO: Alert & oriented, no focal motor/sensory deficits.    LABORATORY DATA: I have personally reviewed the data as listed:  Appointment on 06/04/2017  Component Date Value Ref Range Status  . WBC 06/04/2017 3.1* 3.8 - 10.6 K/uL Final  . RBC 06/04/2017 4.36* 4.40 - 5.90 MIL/uL Final  . Hemoglobin 06/04/2017 13.3  13.0 - 18.0 g/dL Final  . HCT 06/04/2017 39.3* 40.0 - 52.0 % Final  . MCV 06/04/2017 90.0  80.0 - 100.0 fL Final  . MCH 06/04/2017 30.5  26.0 - 34.0 pg Final  . MCHC 06/04/2017 33.9  32.0 - 36.0 g/dL Final  . RDW 06/04/2017 13.6  11.5 - 14.5 % Final  . Platelets 06/04/2017 226  150 - 440 K/uL Final  . Neutrophils Relative % 06/04/2017 41  % Final  . Neutro Abs 06/04/2017 1.3* 1.4 - 6.5 K/uL Final  . Lymphocytes Relative 06/04/2017 46  % Final  . Lymphs Abs 06/04/2017 1.4  1.0 - 3.6 K/uL Final  . Monocytes Relative 06/04/2017 9  % Final  . Monocytes Absolute 06/04/2017 0.3  0.2 - 1.0 K/uL Final  . Eosinophils Relative 06/04/2017 3  % Final  . Eosinophils Absolute 06/04/2017 0.1  0 - 0.7 K/uL Final  . Basophils Relative 06/04/2017 1  % Final  . Basophils Absolute 06/04/2017 0.0  0 - 0.1 K/uL Final  . Sodium 06/04/2017 135  135 - 145 mmol/L Final  . Potassium 06/04/2017 3.9  3.5 - 5.1 mmol/L Final  . Chloride 06/04/2017 100* 101 - 111 mmol/L Final  . CO2 06/04/2017 27  22 - 32 mmol/L Final  . Glucose, Bld 06/04/2017 77  65 - 99 mg/dL Final  . BUN 06/04/2017 15  6 - 20 mg/dL Final  . Creatinine, Ser 06/04/2017 0.97  0.61 - 1.24 mg/dL Final  . Calcium 06/04/2017 9.3  8.9 - 10.3 mg/dL Final  . Total Protein 06/04/2017 8.1  6.5 - 8.1 g/dL Final  . Albumin  06/04/2017 4.3  3.5 - 5.0 g/dL Final  . AST 06/04/2017 33  15 - 41 U/L Final  . ALT 06/04/2017 27  17 - 63 U/L Final  . Alkaline Phosphatase 06/04/2017 109  38 - 126 U/L Final  . Total Bilirubin 06/04/2017 0.8  0.3 - 1.2 mg/dL Final  . GFR calc non Af Amer 06/04/2017 >60  >60 mL/min Final  . GFR calc Af Amer 06/04/2017 >60  >60 mL/min Final   Comment: (NOTE) The eGFR has been calculated using the CKD EPI equation. This calculation has not been validated in all clinical situations. eGFR's persistently <60 mL/min signify possible Chronic Kidney Disease.   .   Anion gap 06/04/2017 8  5 - 15 Final  . Vitamin B-12 06/04/2017 605  180 - 914 pg/mL Final   Comment: (NOTE) This assay is not validated for testing neonatal or myeloproliferative syndrome specimens for Vitamin B12 levels. Performed at [facility], [address] [place], [place] [address]   . TSH 06/04/2017 3.060  0.350 - 4.500 uIU/mL Final   Performed by a 3rd Generation assay with a functional sensitivity of <=0.01 uIU/mL.  . Path Review 06/04/2017 Blood smear reviewed. Platelets are adequate and RBCs are unremarkable. There is mild neutropenia. No abnormal or immature cells are identified.    Final   Reviewed by Mary S. Olney, MD.  . HIV Screen 4th Generation wRfx 06/04/2017 Non Reactive  Non Reactive Final   Comment: (NOTE) Performed At: [facility] [place] [address] [place], [place] [id number] [place] William F MD Ph:[phone]   . Anit Nuclear Antibody(ANA) 06/04/2017 Negative  Negative Final   Comment: (NOTE) Performed At: [facility] [place] [address] [place], [place] [id number] [place] William F MD Ph:[phone]     RADIOGRAPHIC STUDIES: I have personally reviewed the radiological images as listed and agree with the findings in the report No recent image studies.   ASSESSMENT/PLAN 1. Other neutropenia (HCC)    Lab results were discussed with patient. All tests were negative including  pathology smear review, ANA, HIV, TSH, B12.. 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  Doxazosin can cause neutropenia, but that's very rare, usually less than 1% chance.  Will obtain 2-3 CBC with interval of 2 weeks. If ANC is generally >1.2, without history of unusual or severe infection, we can establish the BEN diagnosis.   Orders Placed This Encounter  Procedures  . CBC with Differential/Platelet    Standing Status:   Standing    Number of Occurrences:   4    Standing Expiration Date:   06/18/2018  . Comprehensive metabolic panel    Standing Status:   Future    Standing Expiration Date:   06/18/2018  . CBC with Differential/Platelet    Standing Status:   Future    Standing Expiration Date:   06/18/2018    All questions were answered. The patient knows to call the clinic with any problems, questions or concerns. Thank you for this kind referral and the opportunity to participate in the care of this patient. A copy of today's note will be routed to referring physician Dr.Geyer.     , MD  06/18/2017 8:58 AM 

## 2017-06-18 NOTE — Progress Notes (Signed)
Patient here today for follow up.  Patient states no new concerns today  

## 2017-06-22 ENCOUNTER — Inpatient Hospital Stay: Payer: Medicare HMO

## 2017-06-22 ENCOUNTER — Other Ambulatory Visit: Payer: Self-pay

## 2017-06-22 DIAGNOSIS — M25512 Pain in left shoulder: Secondary | ICD-10-CM | POA: Diagnosis not present

## 2017-06-22 DIAGNOSIS — D709 Neutropenia, unspecified: Secondary | ICD-10-CM | POA: Diagnosis not present

## 2017-06-22 DIAGNOSIS — N400 Enlarged prostate without lower urinary tract symptoms: Secondary | ICD-10-CM | POA: Diagnosis not present

## 2017-06-22 DIAGNOSIS — Z87891 Personal history of nicotine dependence: Secondary | ICD-10-CM | POA: Diagnosis not present

## 2017-06-22 DIAGNOSIS — Z8042 Family history of malignant neoplasm of prostate: Secondary | ICD-10-CM | POA: Diagnosis not present

## 2017-06-22 DIAGNOSIS — Z79899 Other long term (current) drug therapy: Secondary | ICD-10-CM | POA: Diagnosis not present

## 2017-06-22 LAB — CBC WITH DIFFERENTIAL/PLATELET
BASOS ABS: 0 10*3/uL (ref 0–0.1)
BASOS PCT: 1 %
EOS PCT: 4 %
Eosinophils Absolute: 0.1 10*3/uL (ref 0–0.7)
HCT: 37 % — ABNORMAL LOW (ref 40.0–52.0)
Hemoglobin: 12.6 g/dL — ABNORMAL LOW (ref 13.0–18.0)
Lymphocytes Relative: 49 %
Lymphs Abs: 1.3 10*3/uL (ref 1.0–3.6)
MCH: 30.8 pg (ref 26.0–34.0)
MCHC: 33.9 g/dL (ref 32.0–36.0)
MCV: 90.7 fL (ref 80.0–100.0)
MONO ABS: 0.3 10*3/uL (ref 0.2–1.0)
Monocytes Relative: 10 %
Neutro Abs: 1 10*3/uL — ABNORMAL LOW (ref 1.4–6.5)
Neutrophils Relative %: 36 %
PLATELETS: 215 10*3/uL (ref 150–440)
RBC: 4.08 MIL/uL — ABNORMAL LOW (ref 4.40–5.90)
RDW: 14.1 % (ref 11.5–14.5)
WBC: 2.7 10*3/uL — ABNORMAL LOW (ref 3.8–10.6)

## 2017-06-25 ENCOUNTER — Other Ambulatory Visit: Payer: Self-pay

## 2017-06-25 ENCOUNTER — Inpatient Hospital Stay: Payer: Medicare HMO

## 2017-06-26 ENCOUNTER — Other Ambulatory Visit: Payer: Self-pay | Admitting: Oncology

## 2017-06-26 DIAGNOSIS — D708 Other neutropenia: Secondary | ICD-10-CM

## 2017-06-29 ENCOUNTER — Other Ambulatory Visit: Payer: Medicare HMO

## 2017-06-29 DIAGNOSIS — N400 Enlarged prostate without lower urinary tract symptoms: Secondary | ICD-10-CM | POA: Diagnosis not present

## 2017-06-29 DIAGNOSIS — M25512 Pain in left shoulder: Secondary | ICD-10-CM | POA: Diagnosis not present

## 2017-06-29 DIAGNOSIS — Z87891 Personal history of nicotine dependence: Secondary | ICD-10-CM | POA: Diagnosis not present

## 2017-06-29 DIAGNOSIS — Z79899 Other long term (current) drug therapy: Secondary | ICD-10-CM | POA: Diagnosis not present

## 2017-06-29 DIAGNOSIS — D709 Neutropenia, unspecified: Secondary | ICD-10-CM | POA: Diagnosis not present

## 2017-06-29 DIAGNOSIS — Z8042 Family history of malignant neoplasm of prostate: Secondary | ICD-10-CM | POA: Diagnosis not present

## 2017-06-29 LAB — CBC WITH DIFFERENTIAL/PLATELET
BASOS ABS: 0 10*3/uL (ref 0–0.1)
Basophils Relative: 1 %
Eosinophils Absolute: 0.1 10*3/uL (ref 0–0.7)
Eosinophils Relative: 4 %
HEMATOCRIT: 36.6 % — AB (ref 40.0–52.0)
HEMOGLOBIN: 12.6 g/dL — AB (ref 13.0–18.0)
LYMPHS PCT: 52 %
Lymphs Abs: 1.6 10*3/uL (ref 1.0–3.6)
MCH: 31 pg (ref 26.0–34.0)
MCHC: 34.3 g/dL (ref 32.0–36.0)
MCV: 90.2 fL (ref 80.0–100.0)
MONO ABS: 0.3 10*3/uL (ref 0.2–1.0)
Monocytes Relative: 10 %
NEUTROS ABS: 1 10*3/uL — AB (ref 1.4–6.5)
Neutrophils Relative %: 33 %
Platelets: 206 10*3/uL (ref 150–440)
RBC: 4.06 MIL/uL — ABNORMAL LOW (ref 4.40–5.90)
RDW: 13.9 % (ref 11.5–14.5)
WBC: 3.1 10*3/uL — ABNORMAL LOW (ref 3.8–10.6)

## 2017-07-06 ENCOUNTER — Inpatient Hospital Stay: Payer: Medicare HMO

## 2017-07-06 DIAGNOSIS — Z79899 Other long term (current) drug therapy: Secondary | ICD-10-CM | POA: Diagnosis not present

## 2017-07-06 DIAGNOSIS — D708 Other neutropenia: Secondary | ICD-10-CM

## 2017-07-06 DIAGNOSIS — Z8042 Family history of malignant neoplasm of prostate: Secondary | ICD-10-CM | POA: Diagnosis not present

## 2017-07-06 DIAGNOSIS — Z87891 Personal history of nicotine dependence: Secondary | ICD-10-CM | POA: Diagnosis not present

## 2017-07-06 DIAGNOSIS — M25512 Pain in left shoulder: Secondary | ICD-10-CM | POA: Diagnosis not present

## 2017-07-06 DIAGNOSIS — N400 Enlarged prostate without lower urinary tract symptoms: Secondary | ICD-10-CM | POA: Diagnosis not present

## 2017-07-06 DIAGNOSIS — D709 Neutropenia, unspecified: Secondary | ICD-10-CM | POA: Diagnosis not present

## 2017-07-06 LAB — CBC WITH DIFFERENTIAL/PLATELET
Basophils Absolute: 0 10*3/uL (ref 0–0.1)
Basophils Relative: 2 %
EOS PCT: 5 %
Eosinophils Absolute: 0.1 10*3/uL (ref 0–0.7)
HEMATOCRIT: 35.6 % — AB (ref 40.0–52.0)
Hemoglobin: 12.3 g/dL — ABNORMAL LOW (ref 13.0–18.0)
Lymphocytes Relative: 49 %
Lymphs Abs: 1.4 10*3/uL (ref 1.0–3.6)
MCH: 30.9 pg (ref 26.0–34.0)
MCHC: 34.4 g/dL (ref 32.0–36.0)
MCV: 89.9 fL (ref 80.0–100.0)
MONO ABS: 0.2 10*3/uL (ref 0.2–1.0)
Monocytes Relative: 9 %
NEUTROS ABS: 1 10*3/uL — AB (ref 1.4–6.5)
NEUTROS PCT: 35 %
PLATELETS: 199 10*3/uL (ref 150–440)
RBC: 3.96 MIL/uL — ABNORMAL LOW (ref 4.40–5.90)
RDW: 14 % (ref 11.5–14.5)
WBC: 2.8 10*3/uL — ABNORMAL LOW (ref 3.8–10.6)

## 2017-07-07 LAB — NEUTROPHIL AB TEST LEVEL 1: NEUTROPHIL SCR/PANEL INTERP.: POSITIVE — AB

## 2017-07-14 ENCOUNTER — Other Ambulatory Visit: Payer: Self-pay

## 2017-07-20 ENCOUNTER — Inpatient Hospital Stay: Payer: Medicare HMO | Attending: Oncology

## 2017-07-20 DIAGNOSIS — Z8546 Personal history of malignant neoplasm of prostate: Secondary | ICD-10-CM | POA: Diagnosis not present

## 2017-07-20 DIAGNOSIS — M1990 Unspecified osteoarthritis, unspecified site: Secondary | ICD-10-CM | POA: Insufficient documentation

## 2017-07-20 DIAGNOSIS — D708 Other neutropenia: Secondary | ICD-10-CM | POA: Diagnosis not present

## 2017-07-20 DIAGNOSIS — Z79899 Other long term (current) drug therapy: Secondary | ICD-10-CM | POA: Diagnosis not present

## 2017-07-20 DIAGNOSIS — M25512 Pain in left shoulder: Secondary | ICD-10-CM | POA: Insufficient documentation

## 2017-07-20 DIAGNOSIS — Z87891 Personal history of nicotine dependence: Secondary | ICD-10-CM | POA: Diagnosis not present

## 2017-07-20 DIAGNOSIS — N400 Enlarged prostate without lower urinary tract symptoms: Secondary | ICD-10-CM | POA: Diagnosis not present

## 2017-07-20 LAB — CBC WITH DIFFERENTIAL/PLATELET
Basophils Absolute: 0 10*3/uL (ref 0–0.1)
Basophils Relative: 1 %
Eosinophils Absolute: 0.1 10*3/uL (ref 0–0.7)
Eosinophils Relative: 4 %
HCT: 35.7 % — ABNORMAL LOW (ref 40.0–52.0)
Hemoglobin: 12.2 g/dL — ABNORMAL LOW (ref 13.0–18.0)
Lymphocytes Relative: 44 %
Lymphs Abs: 1.4 10*3/uL (ref 1.0–3.6)
MCH: 30.7 pg (ref 26.0–34.0)
MCHC: 34.3 g/dL (ref 32.0–36.0)
MCV: 89.6 fL (ref 80.0–100.0)
Monocytes Absolute: 0.3 10*3/uL (ref 0.2–1.0)
Monocytes Relative: 8 %
Neutro Abs: 1.3 10*3/uL — ABNORMAL LOW (ref 1.4–6.5)
Neutrophils Relative %: 43 %
Platelets: 208 10*3/uL (ref 150–440)
RBC: 3.98 MIL/uL — ABNORMAL LOW (ref 4.40–5.90)
RDW: 13.8 % (ref 11.5–14.5)
WBC: 3.1 10*3/uL — ABNORMAL LOW (ref 3.8–10.6)

## 2017-07-30 ENCOUNTER — Encounter: Payer: Self-pay | Admitting: Oncology

## 2017-07-30 ENCOUNTER — Ambulatory Visit: Payer: Self-pay | Admitting: Oncology

## 2017-07-30 ENCOUNTER — Inpatient Hospital Stay (HOSPITAL_BASED_OUTPATIENT_CLINIC_OR_DEPARTMENT_OTHER): Payer: Medicare HMO | Admitting: Oncology

## 2017-07-30 ENCOUNTER — Inpatient Hospital Stay: Payer: Medicare HMO

## 2017-07-30 ENCOUNTER — Other Ambulatory Visit: Payer: Self-pay

## 2017-07-30 VITALS — BP 143/71 | HR 69 | Temp 97.1°F | Wt 191.6 lb

## 2017-07-30 DIAGNOSIS — Z87891 Personal history of nicotine dependence: Secondary | ICD-10-CM

## 2017-07-30 DIAGNOSIS — D708 Other neutropenia: Secondary | ICD-10-CM

## 2017-07-30 DIAGNOSIS — Z8546 Personal history of malignant neoplasm of prostate: Secondary | ICD-10-CM

## 2017-07-30 DIAGNOSIS — M25512 Pain in left shoulder: Secondary | ICD-10-CM

## 2017-07-30 DIAGNOSIS — M1990 Unspecified osteoarthritis, unspecified site: Secondary | ICD-10-CM

## 2017-07-30 DIAGNOSIS — Z79899 Other long term (current) drug therapy: Secondary | ICD-10-CM | POA: Diagnosis not present

## 2017-07-30 DIAGNOSIS — N400 Enlarged prostate without lower urinary tract symptoms: Secondary | ICD-10-CM

## 2017-07-30 LAB — CBC WITH DIFFERENTIAL/PLATELET
Basophils Absolute: 0 10*3/uL (ref 0–0.1)
Basophils Relative: 1 %
Eosinophils Absolute: 0.2 10*3/uL (ref 0–0.7)
Eosinophils Relative: 5 %
HEMATOCRIT: 37.5 % — AB (ref 40.0–52.0)
HEMOGLOBIN: 13 g/dL (ref 13.0–18.0)
LYMPHS PCT: 44 %
Lymphs Abs: 1.5 10*3/uL (ref 1.0–3.6)
MCH: 31.2 pg (ref 26.0–34.0)
MCHC: 34.6 g/dL (ref 32.0–36.0)
MCV: 90.2 fL (ref 80.0–100.0)
MONO ABS: 0.3 10*3/uL (ref 0.2–1.0)
MONOS PCT: 10 %
NEUTROS ABS: 1.4 10*3/uL (ref 1.4–6.5)
NEUTROS PCT: 40 %
Platelets: 227 10*3/uL (ref 150–440)
RBC: 4.16 MIL/uL — ABNORMAL LOW (ref 4.40–5.90)
RDW: 13.8 % (ref 11.5–14.5)
WBC: 3.4 10*3/uL — ABNORMAL LOW (ref 3.8–10.6)

## 2017-07-30 NOTE — Progress Notes (Signed)
Patient here today for follow up.  Patinet states no new concerns today

## 2017-07-30 NOTE — Progress Notes (Signed)
[facility] Initial Visit:  Patient Care Team: Alanson Aly, FNP as PCP - General (Family Medicine)  CHIEF COMPLAINTS/PURPOSE OF CONSULTATION: Low blood counts.   HISTORY OF PRESENTING ILLNESS: [name] Foster 76 y.o. 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  Patient was accompanied by his wife today. Patient denies any complaint he fails at his baseline health. Denies any night sweats or fever or weight loss. Denies any chest pain, shortness of breath, abdominal pain, or leg swelling. He denies any frequent upper respiratory infections. Patient had some recent labs done with [facility].  he has left shoulder pain for which he had seen PCP and was recommended to see orthopedic surgeon. patient declined. He was then advised to start shoulder exercise and be reassessed.  05/14/2017, hemoglobin 12.9 WBC 2.8 MCV 89.5 platelet 207. ANC 0.9Creatinine 1 bilirubin 0.6 AST 25 ALT 27, alkaline phosphatase In the past,his wbc has been chronically low, ranges from 12.9-13.3. ANC ranges from 0.9-1.9.  INTERVAL HISTORY Patient presents today to discuss lab results. He has no complaints. He denies any frequent infections, fever or chill.s Feels well at baseline.    Review of Systems  Constitutional: Negative.   HENT:  Negative.   Eyes: Negative.   Respiratory: Negative.   Cardiovascular: Negative.   Gastrointestinal: Negative.   Endocrine: Negative.   Genitourinary: Negative.    Musculoskeletal: Positive for arthralgias.       Left shoulder pain.   Skin: Negative.   Neurological: Negative.   Hematological: Negative.   Psychiatric/Behavioral: Negative.     MEDICAL HISTORY: Past Medical History:  Diagnosis Date  . BPH (benign prostatic hyperplasia)   . Former smoker, stopped smoking many years ago   . History of needle biopsy of prostate with negative result 2013  . Prostatic intraepithelial  neoplasm III 2013    SURGICAL HISTORY: Past Surgical History:  Procedure Laterality Date  . HEMORROIDECTOMY    . HERNIA REPAIR    . PROSTATE BIOPSY  2013  . TOTAL HIP ARTHROPLASTY Left     SOCIAL HISTORY: Social History   Social History  . Marital status: Married    Spouse name: N/A  . Number of children: N/A  . Years of education: N/A   Occupational History  . Not on file.   Social History Main Topics  . Smoking status: Former Smoker    Quit date: 06/04/1980  . Smokeless tobacco: Never Used  . Alcohol use No  . Drug use: No  . Sexual activity: Not on file   Other Topics Concern  . Not on file   Social History Narrative  . No narrative on file    FAMILY HISTORY Family History  Problem Relation Age of Onset  . Prostate cancer Father   . Prostate cancer Brother     ALLERGIES:  has No Known Allergies.  MEDICATIONS:  Current Outpatient Prescriptions  Medication Sig Dispense Refill  . Acetaminophen 500 MG coapsule     . doxazosin (CARDURA) 4 MG tablet Take 4 mg by mouth daily.    . finasteride (PROSCAR) 5 MG tablet Take 5 mg by mouth daily.    . multivitamin-iron-minerals-folic acid (CENTRUM) chewable tablet Chew 1 tablet by mouth daily.     No current facility-administered medications for this visit.     PHYSICAL EXAMINATION:  ECOG PERFORMANCE STATUS: 0 - Asymptomatic   Vitals:   07/30/17 1114  BP: (!) 143/71  Pulse: 69  Temp: (!) 97.1 F (36.2 C)    Filed Weights   07/30/17 1114  Weight: 191 lb 9.3 oz (86.9 kg)   Physical Exam GENERAL: No distress, well nourished.  SKIN:  No rashes or significant lesions  HEAD: Normocephalic, No masses, lesions, tenderness or abnormalities  EYES: Conjunctiva are pink, non icteric ENT: External ears normal ,lips , buccal mucosa, and tongue normal and mucous membranes are moist  LYMPH: No palpable cervical and axillary lymphadenopathy  LUNGS: Clear to auscultation, no crackles or wheezes HEART: Regular  rate & rhythm, no murmurs, no gallops, S1 normal and S2 normal  ABDOMEN: Abdomen soft, non-tender, normal bowel sounds, I did not appreciate any  masses or organomegaly  MUSCULOSKELETAL: No CVA tenderness and no tenderness on percussion of the back or rib cage.  EXTREMITIES: No edema, no skin discoloration or tenderness NEURO: Alert & oriented, no focal motor/sensory deficits.    LABORATORY DATA: I have personally reviewed the data as listed:  Appointment on 07/30/2017  Component Date Value Ref Range Status  . WBC 07/30/2017 3.4* 3.8 - 10.6 K/uL Final  . RBC 07/30/2017 4.16* 4.40 - 5.90 MIL/uL Final  . Hemoglobin 07/30/2017 13.0  13.0 - 18.0 g/dL Final  . HCT 07/30/2017 37.5* 40.0 - 52.0 % Final  . MCV 07/30/2017 90.2  80.0 - 100.0 fL Final  . MCH 07/30/2017 31.2  26.0 - 34.0 pg Final  . MCHC 07/30/2017 34.6  32.0 - 36.0 g/dL Final  . RDW 07/30/2017 13.8  11.5 - 14.5 % Final  . Platelets 07/30/2017 227  150 - 440 K/uL Final  . Neutrophils Relative % 07/30/2017 40  % Final  . Neutro Abs 07/30/2017 1.4  1.4 - 6.5 K/uL Final  . Lymphocytes Relative 07/30/2017 44  % Final  . Lymphs Abs 07/30/2017 1.5  1.0 - 3.6 K/uL Final  . Monocytes Relative 07/30/2017 10  % Final  . Monocytes Absolute 07/30/2017 0.3  0.2 - 1.0 K/uL Final  . Eosinophils Relative 07/30/2017 5  % Final  . Eosinophils Absolute 07/30/2017 0.2  0 - 0.7 K/uL Final  . Basophils Relative 07/30/2017 1  % Final  . Basophils Absolute 07/30/2017 0.0  0 - 0.1 K/uL Final  Appointment on 07/20/2017  Component Date Value Ref Range Status  . WBC 07/20/2017 3.1* 3.8 - 10.6 K/uL Final  . RBC 07/20/2017 3.98* 4.40 - 5.90 MIL/uL Final  . Hemoglobin 07/20/2017 12.2* 13.0 - 18.0 g/dL Final  . HCT 07/20/2017 35.7* 40.0 - 52.0 % Final  . MCV 07/20/2017 89.6  80.0 - 100.0 fL Final  . MCH 07/20/2017 30.7  26.0 - 34.0 pg Final  . MCHC 07/20/2017 34.3  32.0 - 36.0 g/dL Final  . RDW 07/20/2017 13.8  11.5 - 14.5 % Final  . Platelets  07/20/2017 208  150 - 440 K/uL Final  . Neutrophils Relative % 07/20/2017 43  % Final  . Neutro Abs 07/20/2017 1.3* 1.4 - 6.5 K/uL Final  . Lymphocytes Relative 07/20/2017 44  % Final  . Lymphs Abs 07/20/2017 1.4  1.0 - 3.6 K/uL Final  . Monocytes Relative 07/20/2017 8  % Final  . Monocytes Absolute 07/20/2017 0.3  0.2 - 1.0 K/uL Final  . Eosinophils Relative 07/20/2017 4  % Final  . Eosinophils Absolute 07/20/2017 0.1  0 - 0.7 K/uL Final  . Basophils Relative 07/20/2017 1  % Final  . Basophils Absolute 07/20/2017 0.0  0 - 0.1 K/uL Final  Appointment on 07/06/2017  Component Date Value Ref Range Status  . WBC 07/06/2017 2.8* 3.8 - 10.6 K/uL Final  . RBC 07/06/2017 3.96* 4.40 - 5.90 MIL/uL Final  . Hemoglobin 07/06/2017 12.3* 13.0 - 18.0 g/dL Final  . HCT 07/06/2017 35.6* 40.0 - 52.0 % Final  . MCV 07/06/2017 89.9  80.0 - 100.0 fL Final  . MCH 07/06/2017 30.9  26.0 - 34.0 pg Final  . MCHC 07/06/2017 34.4  32.0 - 36.0 g/dL Final  . RDW 07/06/2017 14.0  11.5 - 14.5 % Final  . Platelets 07/06/2017 199  150 - 440 K/uL Final  . Neutrophils Relative % 07/06/2017 35  % Final  . Neutro Abs 07/06/2017 1.0* 1.4 - 6.5 K/uL Final  . Lymphocytes Relative 07/06/2017 49  % Final  . Lymphs Abs 07/06/2017 1.4  1.0 - 3.6 K/uL Final  . Monocytes Relative 07/06/2017 9  % Final  . Monocytes Absolute 07/06/2017 0.2  0.2 - 1.0 K/uL Final  . Eosinophils Relative 07/06/2017 5  % Final  . Eosinophils Absolute 07/06/2017 0.1  0 - 0.7 K/uL Final  . Basophils Relative 07/06/2017 2  % Final  . Basophils Absolute 07/06/2017 0.0  0 - 0.1 K/uL Final    Positive neutrophil antibodies.   RADIOGRAPHIC STUDIES: I have personally reviewed the radiological images as listed and agree with the findings in the report No recent image studies.   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 He is negative including pathology smear review, ANA, HIV, TSH, B12..  I have  monitored his neutrophil counts weekly for the past few weeks. Patient's ANC ranges from 1.0-1.4. He has no signs of frequent infection.  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  No need for intervention for now. Will check cbc every 6 months to monitor his counts.  Patient will call if he experience frequent sinopulmonary infection this winter and we can see him earlier.  Follow up with primary care physician for flu shot and pneumococcal vaccine if not done within past 5 years.   All questions were answered. The patient knows to call the clinic with any problems, questions or concerns. Thank you for this kind referral and the opportunity to participate in the care of this patient. A copy of today's note will be routed to referring physician Dr.Geyer.    Return visit: 6 months with cbc done a few days earlier.   Earlie Server, MD  07/30/2017 11:16 AM

## 2017-11-04 DIAGNOSIS — M25512 Pain in left shoulder: Secondary | ICD-10-CM | POA: Diagnosis not present

## 2017-11-04 DIAGNOSIS — G8929 Other chronic pain: Secondary | ICD-10-CM | POA: Diagnosis not present

## 2017-11-04 DIAGNOSIS — Z79899 Other long term (current) drug therapy: Secondary | ICD-10-CM | POA: Diagnosis not present

## 2017-11-04 DIAGNOSIS — N400 Enlarged prostate without lower urinary tract symptoms: Secondary | ICD-10-CM | POA: Diagnosis not present

## 2017-11-04 DIAGNOSIS — Z0000 Encounter for general adult medical examination without abnormal findings: Secondary | ICD-10-CM | POA: Diagnosis not present

## 2017-11-04 DIAGNOSIS — D709 Neutropenia, unspecified: Secondary | ICD-10-CM | POA: Diagnosis not present

## 2017-12-24 ENCOUNTER — Ambulatory Visit: Payer: Self-pay | Admitting: Oncology

## 2017-12-24 ENCOUNTER — Other Ambulatory Visit: Payer: Self-pay

## 2018-01-05 DIAGNOSIS — M25512 Pain in left shoulder: Secondary | ICD-10-CM | POA: Diagnosis not present

## 2018-01-05 DIAGNOSIS — M19012 Primary osteoarthritis, left shoulder: Secondary | ICD-10-CM | POA: Diagnosis not present

## 2018-01-27 NOTE — Progress Notes (Signed)
[facility] Follow up Visit:  Patient Care Team: Alanson Aly, FNP as PCP - General (Family Medicine)  REASON FOR VISIT Follow up for treatment of neutropenia.   HISTORY OF PRESENTING ILLNESS: [name] Foster 77 y.o. 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  Patient was accompanied by his wife today. Patient denies any complaint he fails at his baseline health. Denies any night sweats or fever or weight loss. Denies any chest pain, shortness of breath, abdominal pain, or leg swelling. He denies any frequent upper respiratory infections. Patient had some recent labs done with [facility].  he has left shoulder pain for which he had seen PCP and was recommended to see orthopedic surgeon. patient declined. He was then advised to start shoulder exercise and be reassessed.  05/14/2017, hemoglobin 12.9 WBC 2.8 MCV 89.5 platelet 207. ANC 0.9Creatinine 1 bilirubin 0.6 AST 25 ALT 27, alkaline phosphatase In the past,his wbc has been chronically low, ranges from 12.9-13.3. ANC ranges from 0.9-1.9.  # Work up negative including pathology smear review, ANA, HIV, TSH, B12,  # his neutrophil counts was monitored weekly for the past few weeks. Patient's ANC ranges from 1.0-1.4. He has no signs of frequent infection. 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  INTERVAL HISTORY Patient presents today to discuss lab results. He has no complaints. He denies any frequent infections,Feels well at baseline. Appetite is good. Denies weight loss, night sweating, fever or chills.  He has left shoulder pain and got improved after cortisone injection. He takes meloxicam as needed for pain.   Review of Systems  Constitutional: Negative.   HENT:  Negative.   Eyes: Negative.   Respiratory: Negative.   Cardiovascular: Negative.   Gastrointestinal: Negative.   Endocrine:  Negative.   Genitourinary: Negative.    Musculoskeletal: Positive for arthralgias.       Left shoulder pain.   Skin: Negative.   Neurological: Negative.   Hematological: Negative.   Psychiatric/Behavioral: Negative.     MEDICAL HISTORY: Past Medical History:  Diagnosis Date  . BPH (benign prostatic hyperplasia)   . Former smoker, stopped smoking many years ago   . History of needle biopsy of prostate with negative result 2013  . Prostatic intraepithelial neoplasm III 2013    SURGICAL HISTORY: Past Surgical History:  Procedure Laterality Date  . HEMORROIDECTOMY    . HERNIA REPAIR    . PROSTATE BIOPSY  2013  . TOTAL HIP ARTHROPLASTY Left     SOCIAL HISTORY: Social History   Socioeconomic History  . Marital status: Married    Spouse name: Not on file  . Number of children: Not on file  . Years of education: Not on file  . Highest education level: Not on file  Social Needs  . Financial resource strain: Not on file  . Food insecurity - worry: Not on file  . Food insecurity - inability: Not on file  . Transportation needs - medical: Not on file  . Transportation needs - non-medical: Not on file  Occupational History  . Not on file  Tobacco Use  . Smoking status: Former Smoker    Last attempt to quit: 06/04/1980    Years since quitting: 37.6  . Smokeless tobacco: Never Used  Substance and Sexual Activity  . Alcohol use: No  . Drug use: No  . Sexual activity: Not on file  Other Topics Concern  .  Not on file  Social History Narrative  . Not on file    FAMILY HISTORY Family History  Problem Relation Age of Onset  . Prostate cancer Father   . Prostate cancer Brother     ALLERGIES:  has No Known Allergies.  MEDICATIONS:  Current Outpatient Medications  Medication Sig Dispense Refill  . Acetaminophen 500 MG coapsule     . doxazosin (CARDURA) 4 MG tablet Take 4 mg by mouth daily.    . finasteride (PROSCAR) 5 MG tablet Take 5 mg by mouth daily.    .  multivitamin-iron-minerals-folic acid (CENTRUM) chewable tablet Chew 1 tablet by mouth daily.     No current facility-administered medications for this visit.     PHYSICAL EXAMINATION:  ECOG PERFORMANCE STATUS: 0 - Asymptomatic   Vitals:   01/28/18 0844  BP: (!) 152/88  Pulse: 67  Temp: (!) 97 F (36.1 C)    Filed Weights   01/28/18 0844  Weight: 190 lb 0.6 oz (86.2 kg)   Physical Exam  Constitutional: He is oriented to person, place, and time and well-developed, well-nourished, and in no distress. No distress.  HENT:  Head: Normocephalic.  Mouth/Throat: No oropharyngeal exudate.  Eyes: Pupils are equal, round, and reactive to light. Conjunctivae and EOM are normal. No scleral icterus.  Neck: Normal range of motion. Neck supple.  Cardiovascular: Normal rate and normal heart sounds.  No murmur heard. Pulmonary/Chest: Effort normal and breath sounds normal. No respiratory distress. He has no wheezes.  Abdominal: Soft. Bowel sounds are normal. He exhibits no mass. There is no guarding.  Musculoskeletal: He exhibits no edema.  Lymphadenopathy:    He has no cervical adenopathy.  Neurological: He is alert and oriented to person, place, and time. No cranial nerve deficit.  Skin: Skin is warm and dry.  Psychiatric: Affect and judgment normal.     LABORATORY DATA: I have personally reviewed the data as listed:  No visits with results within 1 Month(s) from this visit.  Latest known visit with results is:  Appointment on 07/30/2017  Component Date Value Ref Range Status  . WBC 07/30/2017 3.4* 3.8 - 10.6 K/uL Final  . RBC 07/30/2017 4.16* 4.40 - 5.90 MIL/uL Final  . Hemoglobin 07/30/2017 13.0  13.0 - 18.0 g/dL Final  . HCT 07/30/2017 37.5* 40.0 - 52.0 % Final  . MCV 07/30/2017 90.2  80.0 - 100.0 fL Final  . MCH 07/30/2017 31.2  26.0 - 34.0 pg Final  . MCHC 07/30/2017 34.6  32.0 - 36.0 g/dL Final  . RDW 07/30/2017 13.8  11.5 - 14.5 % Final  . Platelets 07/30/2017 227  150  - 440 K/uL Final  . Neutrophils Relative % 07/30/2017 40  % Final  . Neutro Abs 07/30/2017 1.4  1.4 - 6.5 K/uL Final  . Lymphocytes Relative 07/30/2017 44  % Final  . Lymphs Abs 07/30/2017 1.5  1.0 - 3.6 K/uL Final  . Monocytes Relative 07/30/2017 10  % Final  . Monocytes Absolute 07/30/2017 0.3  0.2 - 1.0 K/uL Final  . Eosinophils Relative 07/30/2017 5  % Final  . Eosinophils Absolute 07/30/2017 0.2  0 - 0.7 K/uL Final  . Basophils Relative 07/30/2017 1  % Final  . Basophils Absolute 07/30/2017 0.0  0 - 0.1 K/uL Final    Positive neutrophil antibodies.     ASSESSMENT/PLAN 1. 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 Today's ANC at 1, usually ranges from 0.9-1.4.  No need for intervention  for now. Will check cbc every 6 months to monitor his counts.  Patient will call if he experience frequ  All questions were answered. The patient knows to call the clinic with any problems, questions or concerns. Thank you for this kind referral and the opportunity to participate in the care of this patient. A copy of today's note will be routed to referring physician Dr.Geyer.    Return visit: 6 months with cbc,cmp   Earlie Server, MD  01/27/2018 10:23 PM

## 2018-01-28 ENCOUNTER — Inpatient Hospital Stay: Payer: Medicare HMO

## 2018-01-28 ENCOUNTER — Encounter: Payer: Self-pay | Admitting: Oncology

## 2018-01-28 ENCOUNTER — Other Ambulatory Visit: Payer: Self-pay | Admitting: *Deleted

## 2018-01-28 ENCOUNTER — Inpatient Hospital Stay: Payer: Medicare HMO | Attending: Oncology | Admitting: Oncology

## 2018-01-28 ENCOUNTER — Other Ambulatory Visit: Payer: Self-pay

## 2018-01-28 VITALS — BP 152/88 | HR 67 | Temp 97.0°F | Wt 190.0 lb

## 2018-01-28 DIAGNOSIS — D709 Neutropenia, unspecified: Secondary | ICD-10-CM

## 2018-01-28 DIAGNOSIS — D708 Other neutropenia: Secondary | ICD-10-CM

## 2018-01-28 LAB — FOLATE: FOLATE: 34 ng/mL (ref >5.9)

## 2018-01-28 LAB — COMPREHENSIVE METABOLIC PANEL
ALT: 29 U/L (ref 17–63)
ANION GAP: 6 (ref 5–15)
AST: 31 U/L (ref 15–41)
Albumin: 4 g/dL (ref 3.5–5.0)
Alkaline Phosphatase: 99 U/L (ref 38–126)
BUN: 18 mg/dL (ref 6–20)
CHLORIDE: 100 mmol/L — AB (ref 101–111)
CO2: 28 mmol/L (ref 22–32)
Calcium: 8.9 mg/dL (ref 8.9–10.3)
Creatinine, Ser: 0.89 mg/dL (ref 0.61–1.24)
GFR calc Af Amer: >60 mL/min (ref >60)
Glucose, Bld: 133 mg/dL — ABNORMAL HIGH (ref 65–99)
POTASSIUM: 4.3 mmol/L (ref 3.5–5.1)
Sodium: 134 mmol/L — ABNORMAL LOW (ref 135–145)
Total Bilirubin: 1.2 mg/dL (ref 0.3–1.2)
Total Protein: 7.8 g/dL (ref 6.5–8.1)

## 2018-01-28 LAB — CBC WITH DIFFERENTIAL/PLATELET
BASOS ABS: 0 10*3/uL (ref 0–0.1)
Basophils Relative: 1 %
EOS PCT: 3 %
Eosinophils Absolute: 0.1 10*3/uL (ref 0–0.7)
HCT: 38.9 % — ABNORMAL LOW (ref 40.0–52.0)
Hemoglobin: 13.4 g/dL (ref 13.0–18.0)
LYMPHS PCT: 50 %
Lymphs Abs: 1.4 10*3/uL (ref 1.0–3.6)
MCH: 31.2 pg (ref 26.0–34.0)
MCHC: 34.5 g/dL (ref 32.0–36.0)
MCV: 90.5 fL (ref 80.0–100.0)
MONO ABS: 0.3 10*3/uL (ref 0.2–1.0)
Monocytes Relative: 10 %
Neutro Abs: 1 10*3/uL — ABNORMAL LOW (ref 1.4–6.5)
Neutrophils Relative %: 36 %
PLATELETS: 181 10*3/uL (ref 150–440)
RBC: 4.3 MIL/uL — ABNORMAL LOW (ref 4.40–5.90)
RDW: 14.3 % (ref 11.5–14.5)
WBC: 2.7 10*3/uL — ABNORMAL LOW (ref 3.8–10.6)

## 2018-01-28 NOTE — Progress Notes (Signed)
Patient here today for follow up.  Patient states no new concerns today  

## 2018-05-07 DIAGNOSIS — H2513 Age-related nuclear cataract, bilateral: Secondary | ICD-10-CM | POA: Diagnosis not present

## 2018-05-17 DIAGNOSIS — R51 Headache: Secondary | ICD-10-CM | POA: Diagnosis not present

## 2018-05-17 DIAGNOSIS — R5383 Other fatigue: Secondary | ICD-10-CM | POA: Diagnosis not present

## 2018-05-19 DIAGNOSIS — B9689 Other specified bacterial agents as the cause of diseases classified elsewhere: Secondary | ICD-10-CM | POA: Diagnosis not present

## 2018-05-19 DIAGNOSIS — J019 Acute sinusitis, unspecified: Secondary | ICD-10-CM | POA: Diagnosis not present

## 2018-05-19 DIAGNOSIS — R5383 Other fatigue: Secondary | ICD-10-CM | POA: Diagnosis not present

## 2018-05-19 DIAGNOSIS — R5381 Other malaise: Secondary | ICD-10-CM | POA: Diagnosis not present

## 2018-05-19 DIAGNOSIS — R748 Abnormal levels of other serum enzymes: Secondary | ICD-10-CM | POA: Diagnosis not present

## 2018-07-23 DIAGNOSIS — M7552 Bursitis of left shoulder: Secondary | ICD-10-CM | POA: Diagnosis not present

## 2018-07-29 ENCOUNTER — Inpatient Hospital Stay: Payer: Medicare HMO | Attending: Oncology | Admitting: Oncology

## 2018-07-29 ENCOUNTER — Encounter: Payer: Self-pay | Admitting: Oncology

## 2018-07-29 ENCOUNTER — Other Ambulatory Visit: Payer: Self-pay

## 2018-07-29 ENCOUNTER — Inpatient Hospital Stay: Payer: Medicare HMO

## 2018-07-29 VITALS — BP 142/73 | HR 60 | Temp 97.0°F | Wt 184.6 lb

## 2018-07-29 DIAGNOSIS — Z8042 Family history of malignant neoplasm of prostate: Secondary | ICD-10-CM

## 2018-07-29 DIAGNOSIS — Z87891 Personal history of nicotine dependence: Secondary | ICD-10-CM | POA: Diagnosis not present

## 2018-07-29 DIAGNOSIS — D708 Other neutropenia: Secondary | ICD-10-CM

## 2018-07-29 LAB — COMPREHENSIVE METABOLIC PANEL
ALBUMIN: 4.2 g/dL (ref 3.5–5.0)
ALK PHOS: 100 U/L (ref 38–126)
ALT: 29 U/L (ref 0–44)
AST: 29 U/L (ref 15–41)
Anion gap: 9 (ref 5–15)
BILIRUBIN TOTAL: 0.6 mg/dL (ref 0.3–1.2)
BUN: 11 mg/dL (ref 8–23)
CALCIUM: 9.2 mg/dL (ref 8.9–10.3)
CO2: 27 mmol/L (ref 22–32)
Chloride: 101 mmol/L (ref 98–111)
Creatinine, Ser: 0.95 mg/dL (ref 0.61–1.24)
GFR calc Af Amer: >60 mL/min (ref >60)
GFR calc non Af Amer: >60 mL/min (ref >60)
GLUCOSE: 108 mg/dL — AB (ref 70–99)
Potassium: 4.3 mmol/L (ref 3.5–5.1)
Sodium: 137 mmol/L (ref 135–145)
TOTAL PROTEIN: 8.1 g/dL (ref 6.5–8.1)

## 2018-07-29 LAB — CBC WITH DIFFERENTIAL/PLATELET
BASOS ABS: 0 10*3/uL (ref 0–0.1)
BASOS PCT: 1 %
EOS PCT: 7 %
Eosinophils Absolute: 0.2 10*3/uL (ref 0–0.7)
HCT: 39.8 % — ABNORMAL LOW (ref 40.0–52.0)
Hemoglobin: 13.6 g/dL (ref 13.0–18.0)
Lymphocytes Relative: 50 %
Lymphs Abs: 1.6 10*3/uL (ref 1.0–3.6)
MCH: 30.8 pg (ref 26.0–34.0)
MCHC: 34.2 g/dL (ref 32.0–36.0)
MCV: 90.1 fL (ref 80.0–100.0)
MONO ABS: 0.4 10*3/uL (ref 0.2–1.0)
MONOS PCT: 11 %
Neutro Abs: 1 10*3/uL — ABNORMAL LOW (ref 1.4–6.5)
Neutrophils Relative %: 31 %
PLATELETS: 231 10*3/uL (ref 150–440)
RBC: 4.42 MIL/uL (ref 4.40–5.90)
RDW: 15.2 % — AB (ref 11.5–14.5)
WBC: 3.2 10*3/uL — ABNORMAL LOW (ref 3.8–10.6)

## 2018-07-29 NOTE — Progress Notes (Signed)
[facility] Follow up Visit:  Patient Care Team: Alanson Aly, FNP as PCP - General (Family Medicine)  REASON FOR VISIT Follow up for mangement of neutropenia.   HISTORY OF PRESENTING ILLNESS: [name] Foster 77 y.o. 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  Patient was accompanied by his wife today. Patient denies any complaint he fails at his baseline health. Denies any night sweats or fever or weight loss. Denies any chest pain, shortness of breath, abdominal pain, or leg swelling. He denies any frequent upper respiratory infections. Patient had some recent labs done with [facility].  he has left shoulder pain for which he had seen PCP and was recommended to see orthopedic surgeon. patient declined. He was then advised to start shoulder exercise and be reassessed.  05/14/2017, hemoglobin 12.9 WBC 2.8 MCV 89.5 platelet 207. ANC 0.9Creatinine 1 bilirubin 0.6 AST 25 ALT 27, alkaline phosphatase In the past,his wbc has been chronically low, ranges from 12.9-13.3. ANC ranges from 0.9-1.9.  # Work up negative including pathology smear review, ANA, HIV, TSH, B12,  # his neutrophil counts was monitored weekly for the past few weeks. Patient's ANC ranges from 1.0-1.4. He has no signs of frequent infection. 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  INTERVAL HISTORY Patient with above history reviewed by me today presents for follow-up of neutropenia.  Patient continues to do well.  He has no complaints today.  Denies any frequent infections, unintentional weight loss, fever chills, night sweats.   Review of Systems  Constitutional: Negative for chills, diaphoresis, fatigue, fever and unexpected weight change.  HENT:   Negative for hearing loss, lump/mass, nosebleeds and sore throat.   Eyes: Negative for eye problems and icterus.  Respiratory:  Negative for chest tightness, cough, hemoptysis, shortness of breath and wheezing.   Cardiovascular: Negative for chest pain and leg swelling.  Gastrointestinal: Negative for abdominal distention, abdominal pain, blood in stool, diarrhea, nausea and rectal pain.  Endocrine: Negative for hot flashes.  Genitourinary: Negative for bladder incontinence, difficulty urinating, dysuria, frequency, hematuria and nocturia.   Musculoskeletal: Negative for arthralgias, back pain, flank pain, gait problem and myalgias.  Skin: Negative for rash.  Neurological: Negative for dizziness, gait problem, headaches, numbness and seizures.  Hematological: Negative for adenopathy. Does not bruise/bleed easily.  Psychiatric/Behavioral: Negative for confusion and decreased concentration. The patient is not nervous/anxious.     MEDICAL HISTORY: Past Medical History:  Diagnosis Date  . BPH (benign prostatic hyperplasia)   . Former smoker, stopped smoking many years ago   . History of needle biopsy of prostate with negative result 2013  . Prostatic intraepithelial neoplasm III 2013    SURGICAL HISTORY: Past Surgical History:  Procedure Laterality Date  . HEMORROIDECTOMY    . HERNIA REPAIR    . PROSTATE BIOPSY  2013  . TOTAL HIP ARTHROPLASTY Left     SOCIAL HISTORY: Social History   Socioeconomic History  . Marital status: Married    Spouse name: Not on file  . Number of children: Not on file  . Years of education: Not on file  . Highest education level: Not on file  Occupational History  . Not on file  Social Needs  . Financial resource strain: Not on file  . Food insecurity:    Worry: Not on file    Inability: Not on file  . Transportation needs:  Medical: Not on file    Non-medical: Not on file  Tobacco Use  . Smoking status: Former Smoker    Last attempt to quit: 06/04/1980    Years since quitting: 38.1  . Smokeless tobacco: Never Used  Substance and Sexual Activity  . Alcohol use: No   . Drug use: No  . Sexual activity: Not on file  Lifestyle  . Physical activity:    Days per week: Not on file    Minutes per session: Not on file  . Stress: Not on file  Relationships  . Social connections:    Talks on phone: Not on file    Gets together: Not on file    Attends religious service: Not on file    Active member of club or organization: Not on file    Attends meetings of clubs or organizations: Not on file    Relationship status: Not on file  . Intimate partner violence:    Fear of current or ex partner: Not on file    Emotionally abused: Not on file    Physically abused: Not on file    Forced sexual activity: Not on file  Other Topics Concern  . Not on file  Social History Narrative  . Not on file    FAMILY HISTORY Family History  Problem Relation Age of Onset  . Prostate cancer Father   . Prostate cancer Brother     ALLERGIES:  has No Known Allergies.  MEDICATIONS:  Current Outpatient Medications  Medication Sig Dispense Refill  . Acetaminophen 500 MG coapsule     . doxazosin (CARDURA) 4 MG tablet Take 4 mg by mouth daily.    . finasteride (PROSCAR) 5 MG tablet Take 5 mg by mouth daily.    . meloxicam (MOBIC) 7.5 MG tablet Take 7.5 mg by mouth daily.    . multivitamin-iron-minerals-folic acid (CENTRUM) chewable tablet Chew 1 tablet by mouth daily.     No current facility-administered medications for this visit.     PHYSICAL EXAMINATION:  ECOG PERFORMANCE STATUS: 0 - Asymptomatic   Vitals:   07/29/18 0840  BP: (!) 142/73  Pulse: 60  Temp: (!) 97 F (36.1 C)    Filed Weights   07/29/18 0840  Weight: 184 lb 10.2 oz (83.7 kg)   Physical Exam  Constitutional: He is oriented to person, place, and time. No distress.  HENT:  Head: Normocephalic and atraumatic.  Nose: Nose normal.  Mouth/Throat: Oropharynx is clear and moist. No oropharyngeal exudate.  Eyes: Pupils are equal, round, and reactive to light. Conjunctivae and EOM are normal.  Left eye exhibits no discharge. No scleral icterus.  Neck: Normal range of motion. Neck supple.  Cardiovascular: Normal rate, regular rhythm and normal heart sounds.  No murmur heard. Pulmonary/Chest: Effort normal and breath sounds normal. No respiratory distress. He has no wheezes. He has no rales. He exhibits no tenderness.  Abdominal: Soft. Bowel sounds are normal. He exhibits no distension and no mass. There is no tenderness. There is no guarding.  Musculoskeletal: Normal range of motion. He exhibits no edema.  Lymphadenopathy:    He has no cervical adenopathy.  Neurological: He is alert and oriented to person, place, and time. No cranial nerve deficit. He exhibits normal muscle tone. Coordination normal.  Skin: Skin is warm and dry. He is not diaphoretic. No erythema.  Psychiatric: Affect and judgment normal.     LABORATORY DATA: I have personally reviewed the data as listed:  Appointment on 07/29/2018  Component Date Value Ref Range Status  . WBC 07/29/2018 3.2* 3.8 - 10.6 K/uL Final  . RBC 07/29/2018 4.42  4.40 - 5.90 MIL/uL Final  . Hemoglobin 07/29/2018 13.6  13.0 - 18.0 g/dL Final  . HCT 07/29/2018 39.8* 40.0 - 52.0 % Final  . MCV 07/29/2018 90.1  80.0 - 100.0 fL Final  . MCH 07/29/2018 30.8  26.0 - 34.0 pg Final  . MCHC 07/29/2018 34.2  32.0 - 36.0 g/dL Final  . RDW 07/29/2018 15.2* 11.5 - 14.5 % Final  . Platelets 07/29/2018 231  150 - 440 K/uL Final  . Neutrophils Relative % 07/29/2018 31  % Final  . Neutro Abs 07/29/2018 1.0* 1.4 - 6.5 K/uL Final  . Lymphocytes Relative 07/29/2018 50  % Final  . Lymphs Abs 07/29/2018 1.6  1.0 - 3.6 K/uL Final  . Monocytes Relative 07/29/2018 11  % Final  . Monocytes Absolute 07/29/2018 0.4  0.2 - 1.0 K/uL Final  . Eosinophils Relative 07/29/2018 7  % Final  . Eosinophils Absolute 07/29/2018 0.2  0 - 0.7 K/uL Final  . Basophils Relative 07/29/2018 1  % Final  . Basophils Absolute 07/29/2018 0.0  0 - 0.1 K/uL Final   Performed at  [facility], [address], [place], [place] [address]    Positive neutrophil antibodies.     ASSESSMENT/PLAN 1. 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 Today's ANC is 1.  Total white count 2.7. Discussed with patient that we will continue observation.  No intervention for now. He can has CBC checked every 6 months to monitor his counts. Patient knows to call if he experience frequent infections. Follow-up in the clinic in 1 year  Return visit: 6 months with repeat of CBC, following the clinic in 1 year with cbc,cmp  Orders Placed This Encounter  Procedures  . CBC with Differential/Platelet    Standing Status:   Future    Standing Expiration Date:   07/30/2019  . CBC with Differential/Platelet    Standing Status:   Future    Standing Expiration Date:   10/29/2019  . Comprehensive metabolic panel    Standing Status:   Future    Standing Expiration Date:   10/29/2019   Total face to face encounter time for this patient visit was 41min. >50% of the time was  spent in counseling and coordination of care.   Earlie Server, MD  07/29/2018 8:33 AM

## 2018-08-03 LAB — COMP PANEL: LEUKEMIA/LYMPHOMA

## 2018-08-06 ENCOUNTER — Encounter: Payer: Self-pay | Admitting: Urology

## 2018-08-06 ENCOUNTER — Ambulatory Visit: Payer: Medicare HMO | Admitting: Urology

## 2018-08-06 VITALS — BP 133/68 | HR 74 | Ht 68.0 in | Wt 179.8 lb

## 2018-08-06 DIAGNOSIS — R972 Elevated prostate specific antigen [PSA]: Secondary | ICD-10-CM

## 2018-08-06 DIAGNOSIS — N401 Enlarged prostate with lower urinary tract symptoms: Secondary | ICD-10-CM | POA: Diagnosis not present

## 2018-08-06 NOTE — Progress Notes (Signed)
08/06/2018 3:03 PM   [name] Foster [birth year] [id number]  Referring provider: Alanson Aly, FNP No address on file  Chief Complaint  Patient presents with  . Establish Care    HPI: 77 year old male presents to establish local urologic care.  He has seen Dr. Jacqlyn Foster for the past several years and last saw him at [facility] in March 2018.  He has BPH with lower urinary tract symptoms.  He is on combination therapy with finasteride and doxazosin with no bothersome lower urinary tract symptoms.  A prostate biopsy was performed in September 2013 for PSA of 4.4.  Prostate volume was 133 g.  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 His last PSA July 2018 was 2.61 (uncorrected).  PMH: Past Medical History:  Diagnosis Date  . Arthritis   . BPH (benign prostatic hyperplasia)   . Former smoker, stopped smoking many years ago   . History of needle biopsy of prostate with negative result 2013  . Hypertension   . Prostatic intraepithelial neoplasm III 2013    Surgical History: Past Surgical History:  Procedure Laterality Date  . HEMORROIDECTOMY    . HERNIA REPAIR    . PROSTATE BIOPSY  2013  . TOTAL HIP ARTHROPLASTY Left     Home Medications:  Allergies as of 08/06/2018   No Known Allergies     Medication List        Accurate as of 08/06/18  3:03 PM. Always use your most recent med list.          Acetaminophen 500 MG coapsule   doxazosin 4 MG tablet Commonly known as:  CARDURA Take 4 mg by mouth daily.   finasteride 5 MG tablet Commonly known as:  PROSCAR Take 5 mg by mouth daily.   meloxicam 7.5 MG tablet Commonly known as:  MOBIC Take 7.5 mg by mouth daily.   multivitamin-iron-minerals-folic acid chewable tablet Chew 1 tablet by mouth daily.       Allergies: No Known Allergies  Family History: Family History  Problem Relation Age of Onset  . Prostate cancer Father   . Cancer Father   . Prostate cancer Brother   . Cancer Mother     Social History:   reports that he quit smoking about 38 years ago. He has never used smokeless tobacco. He reports that he does not drink alcohol or use drugs.  ROS: UROLOGY Frequent Urination?: No Hard to postpone urination?: No Burning/pain with urination?: No Get up at night to urinate?: No Leakage of urine?: No Urine stream starts and stops?: No Trouble starting stream?: No Do you have to strain to urinate?: No Blood in urine?: No Urinary tract infection?: No Sexually transmitted disease?: No Injury to kidneys or bladder?: No Painful intercourse?: No Weak stream?: No Erection problems?: No Penile pain?: No  Gastrointestinal Nausea?: No Vomiting?: No Indigestion/heartburn?: No Diarrhea?: No Constipation?: No  Constitutional Fever: No Night sweats?: No Weight loss?: No Fatigue?: No  Skin Skin rash/lesions?: No Itching?: No  Eyes Blurred vision?: No Double vision?: No  Ears/Nose/Throat Sore throat?: No Sinus problems?: No  Hematologic/Lymphatic Swollen glands?: No Easy bruising?: No  Cardiovascular Leg swelling?: No Chest pain?: No  Respiratory Cough?: Yes Shortness of breath?: No  Endocrine Excessive thirst?: No  Musculoskeletal Back pain?: No Joint pain?: Yes  Neurological Headaches?: No Dizziness?: No  Psychologic Depression?: No Anxiety?: No  Physical Exam: BP 133/68 (BP Location: Left Arm, Patient Position: Sitting, Cuff Size: Normal)   Pulse 74   Ht  5\' 8"  (1.727 m)   Wt 179 lb 12.8 oz (81.6 kg)   BMI 27.34 kg/m   Constitutional:  Alert and oriented, No acute distress. HEENT: [place] AT, moist mucus membranes.  Trachea midline, no masses. Cardiovascular: No clubbing, cyanosis, or edema. Respiratory: Normal respiratory effort, no increased work of breathing. GI: Abdomen is soft, nontender, nondistended, no abdominal masses GU: No CVA tenderness.  Prostate 50 g, smooth without nodules Lymph: No cervical or inguinal lymphadenopathy. Skin: No rashes,  bruises or suspicious lesions. Neurologic: Grossly intact, no focal deficits, moving all 4 extremities. Psychiatric: Normal mood and affect.   Assessment & Plan:   77 year old male with BPH and no bothersome lower urinary tract symptoms on medical management.  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  DRE today is benign.  His finasteride and doxazosin was refilled.  PSA was drawn and he will be notified with results.  If stable follow-up in 1 year.    [name] Foster, [place] [address], [place], [place] [address] [phone]

## 2018-08-07 LAB — PSA: PROSTATE SPECIFIC AG, SERUM: 1.9 ng/mL (ref 0.0–4.0)

## 2018-08-09 ENCOUNTER — Encounter: Payer: Self-pay | Admitting: Urology

## 2018-08-09 MED ORDER — DOXAZOSIN MESYLATE 4 MG PO TABS: 4.0000 mg | ORAL_TABLET | Freq: Every day | ORAL | 3 refills | Status: DC

## 2018-08-09 MED ORDER — FINASTERIDE 5 MG PO TABS: 5.0000 mg | ORAL_TABLET | Freq: Every day | ORAL | 3 refills | Status: DC

## 2019-01-26 ENCOUNTER — Other Ambulatory Visit: Payer: Self-pay

## 2019-01-26 ENCOUNTER — Encounter
Admission: RE | Admit: 2019-01-26 | Discharge: 2019-01-26 | Disposition: A | Discharge status: Home / Self Care | Payer: Medicare HMO | Source: Ambulatory Visit | Attending: Orthopedic Surgery | Admitting: Orthopedic Surgery

## 2019-01-26 DIAGNOSIS — Z01812 Encounter for preprocedural laboratory examination: Secondary | ICD-10-CM | POA: Insufficient documentation

## 2019-01-26 HISTORY — DX: Benign prostatic hyperplasia without lower urinary tract symptoms: N40.0

## 2019-01-26 LAB — CBC
HCT: 34.5 % — ABNORMAL LOW (ref 39.0–52.0)
Hemoglobin: 11.6 g/dL — ABNORMAL LOW (ref 13.0–17.0)
MCH: 29.8 pg (ref 26.0–34.0)
MCHC: 33.6 g/dL (ref 30.0–36.0)
MCV: 88.7 fL (ref 80.0–100.0)
Platelets: 309 10*3/uL (ref 150–400)
RBC: 3.89 MIL/uL — ABNORMAL LOW (ref 4.22–5.81)
RDW: 13.2 % (ref 11.5–15.5)
WBC: 3.6 10*3/uL — ABNORMAL LOW (ref 4.0–10.5)
nRBC: 0 % (ref 0.0–0.2)

## 2019-01-26 LAB — URINALYSIS, COMPLETE (UACMP) WITH MICROSCOPIC
Bacteria, UA: NONE SEEN
Bilirubin Urine: NEGATIVE
Glucose, UA: NEGATIVE mg/dL
Ketones, ur: NEGATIVE mg/dL
Leukocytes,Ua: NEGATIVE
Nitrite: NEGATIVE
Protein, ur: NEGATIVE mg/dL
Specific Gravity, Urine: 1.019 (ref 1.005–1.030)
Squamous Epithelial / HPF: NONE SEEN (ref 0–5)
pH: 5 (ref 5.0–8.0)

## 2019-01-26 LAB — PROTIME-INR
INR: 1 (ref 0.8–1.2)
Prothrombin Time: 13.5 seconds (ref 11.4–15.2)

## 2019-01-26 LAB — APTT: aPTT: 32 seconds (ref 24–36)

## 2019-01-26 LAB — SURGICAL PCR SCREEN
MRSA, PCR: NEGATIVE
STAPHYLOCOCCUS AUREUS: NEGATIVE

## 2019-01-26 LAB — BASIC METABOLIC PANEL
Anion gap: 8 (ref 5–15)
BUN: 14 mg/dL (ref 8–23)
CO2: 26 mmol/L (ref 22–32)
Calcium: 8.6 mg/dL — ABNORMAL LOW (ref 8.9–10.3)
Chloride: 102 mmol/L (ref 98–111)
Creatinine, Ser: 0.96 mg/dL (ref 0.61–1.24)
GFR calc Af Amer: >60 mL/min (ref >60)
GFR calc non Af Amer: >60 mL/min (ref >60)
Glucose, Bld: 128 mg/dL — ABNORMAL HIGH (ref 70–99)
Potassium: 3.7 mmol/L (ref 3.5–5.1)
Sodium: 136 mmol/L (ref 135–145)

## 2019-01-26 LAB — SEDIMENTATION RATE: Sed Rate: 60 mm/hr — ABNORMAL HIGH (ref 0–20)

## 2019-01-26 NOTE — Patient Instructions (Signed)
Your procedure is scheduled on: To be rescheduled Report to Same Day Surgery 2nd floor medical mall [facility] Entrance-take elevator on left to 2nd floor.  Check in with surgery information desk.) To find out your arrival time please call [phone] between 1PM - 3PM on   Remember: Instructions that are not followed completely may result in serious medical risk, up to and including death, or upon the discretion of your surgeon and anesthesiologist your surgery may need to be rescheduled.    _x___ 1. Do not eat food after midnight the night before your procedure. You may drink clear liquids up to 2 hours before you are scheduled to arrive at the hospital for your procedure.  Do not drink clear liquids within 2 hours of your scheduled arrival to the hospital.  Clear liquids include  --Water or Apple juice without pulp  --Clear carbohydrate beverage such as ClearFast or Gatorade  --Black Coffee or Clear Tea (No milk, no creamers, do not add anything to                  the coffee or Tea Type 1 and type 2 diabetics should only drink water.   ____Ensure clear carbohydrate drink on the way to the hospital for bariatric patients  ____Ensure clear carbohydrate drink 3 hours before surgery for Dr Dwyane Luo patients if physician instructed.   No gum chewing or hard candies.     __x__ 2. No Alcohol for 24 hours before or after surgery.   __x__3. No Smoking or e-cigarettes for 24 prior to surgery.  Do not use any chewable tobacco products for at least 6 hour prior to surgery   ____  4. Bring all medications with you on the day of surgery if instructed.    __x__ 5. Notify your doctor if there is any change in your medical condition     (cold, fever, infections).    x___6. On the morning of surgery brush your teeth with toothpaste and water.  You may rinse your mouth with mouth wash if you wish.  Do not swallow any toothpaste or mouthwash.   Do not wear jewelry, make-up, hairpins, clips or  nail polish.  Do not wear lotions, powders, or perfumes. You may wear deodorant.  Do not shave 48 hours prior to surgery. Men may shave face and neck.  Do not bring valuables to the hospital.    [facility] is not responsible for any belongings or valuables.               Contacts, dentures or bridgework may not be worn into surgery.  Leave your suitcase in the car. After surgery it may be brought to your room.  For patients admitted to the hospital, discharge time is determined by your                       treatment team.  _  Patients discharged the day of surgery will not be allowed to drive home.  You will need someone to drive you home and stay with you the night of your procedure.    Please read over the following fact sheets that you were given:   [facility] Preparing for Surgery and or MRSA Information   _x___ Take anti-hypertensive listed below, cardiac, seizure, asthma,     anti-reflux and psychiatric medicines. These include:  1. doxazosin (CARDURA) 4 MG tablet  2.  3.  4.  5.  6.  ____Fleets enema or  Magnesium Citrate as directed.   _x___ Use CHG Soap or sage wipes as directed on instruction sheet   ____ Use inhalers on the day of surgery and bring to hospital day of surgery  ____ Stop Metformin and Janumet 2 days prior to surgery.    ____ Take 1/2 of usual insulin dose the night before surgery and none on the morning     surgery.   _x___ Follow recommendations from Cardiologist, Pulmonologist or PCP regarding          stopping Aspirin, Coumadin, Plavix ,Eliquis, Effient, or Pradaxa, and Pletal.  X____Stop Anti-inflammatories such as Advil, Aleve, Ibuprofen, Motrin, Naproxen, Naprosyn, Goodies powders or aspirin products. OK to take Tylenol and                          Celebrex. Stop Mobic 1 week before surgery.   _x___ Stop supplements until after surgery.  But may continue Vitamin D, Vitamin B,       and multivitamin.   ____ Bring C-Pap to the hospital.

## 2019-01-27 LAB — TYPE AND SCREEN
ABO/RH(D): AB POS
Antibody Screen: NEGATIVE

## 2019-01-28 ENCOUNTER — Inpatient Hospital Stay: Payer: Medicare HMO | Attending: Hematology and Oncology

## 2019-01-28 ENCOUNTER — Other Ambulatory Visit: Payer: Self-pay

## 2019-01-28 DIAGNOSIS — D708 Other neutropenia: Secondary | ICD-10-CM | POA: Diagnosis present

## 2019-01-28 DIAGNOSIS — Z87891 Personal history of nicotine dependence: Secondary | ICD-10-CM | POA: Insufficient documentation

## 2019-01-28 DIAGNOSIS — Z8042 Family history of malignant neoplasm of prostate: Secondary | ICD-10-CM | POA: Diagnosis not present

## 2019-01-28 LAB — URINE CULTURE: Culture: 40000 — AB

## 2019-01-28 LAB — CBC WITH DIFFERENTIAL/PLATELET
Abs Immature Granulocytes: 0.01 10*3/uL (ref 0.00–0.07)
Basophils Absolute: 0 10*3/uL (ref 0.0–0.1)
Basophils Relative: 1 %
EOS ABS: 0.2 10*3/uL (ref 0.0–0.5)
Eosinophils Relative: 4 %
HCT: 35.5 % — ABNORMAL LOW (ref 39.0–52.0)
Hemoglobin: 11.8 g/dL — ABNORMAL LOW (ref 13.0–17.0)
Immature Granulocytes: 0 %
Lymphocytes Relative: 33 %
Lymphs Abs: 1.5 10*3/uL (ref 0.7–4.0)
MCH: 30 pg (ref 26.0–34.0)
MCHC: 33.2 g/dL (ref 30.0–36.0)
MCV: 90.3 fL (ref 80.0–100.0)
MONO ABS: 0.6 10*3/uL (ref 0.1–1.0)
Monocytes Relative: 12 %
Neutro Abs: 2.3 10*3/uL (ref 1.7–7.7)
Neutrophils Relative %: 50 %
Platelets: 293 10*3/uL (ref 150–400)
RBC: 3.93 MIL/uL — ABNORMAL LOW (ref 4.22–5.81)
RDW: 13.3 % (ref 11.5–15.5)
WBC: 4.7 10*3/uL (ref 4.0–10.5)
nRBC: 0 % (ref 0.0–0.2)

## 2019-02-08 ENCOUNTER — Encounter: Payer: Self-pay | Source: Ambulatory Visit

## 2019-02-08 ENCOUNTER — Inpatient Hospital Stay: Admit: 2019-02-08 | Payer: Medicare HMO | Source: Ambulatory Visit | Admitting: Orthopedic Surgery

## 2019-02-08 SURGERY — ARTHROPLASTY, HIP, TOTAL,POSTERIOR APPROACH
Anesthesia: Choice | Laterality: Right

## 2019-03-15 ENCOUNTER — Inpatient Hospital Stay: Admit: 2019-03-15 | Payer: Medicare HMO | Admitting: Orthopedic Surgery

## 2019-03-15 SURGERY — ARTHROPLASTY, HIP, TOTAL,POSTERIOR APPROACH
Anesthesia: Choice | Laterality: Right

## 2019-04-14 ENCOUNTER — Other Ambulatory Visit: Payer: Medicare HMO

## 2019-04-21 ENCOUNTER — Other Ambulatory Visit: Payer: Medicare HMO

## 2019-04-21 DIAGNOSIS — M170 Bilateral primary osteoarthritis of knee: Secondary | ICD-10-CM | POA: Insufficient documentation

## 2019-04-21 DIAGNOSIS — Z79899 Other long term (current) drug therapy: Secondary | ICD-10-CM | POA: Diagnosis not present

## 2019-04-21 DIAGNOSIS — M059 Rheumatoid arthritis with rheumatoid factor, unspecified: Secondary | ICD-10-CM | POA: Diagnosis not present

## 2019-04-21 DIAGNOSIS — M19042 Primary osteoarthritis, left hand: Secondary | ICD-10-CM | POA: Diagnosis not present

## 2019-04-21 DIAGNOSIS — M069 Rheumatoid arthritis, unspecified: Secondary | ICD-10-CM | POA: Diagnosis not present

## 2019-04-21 DIAGNOSIS — M160 Bilateral primary osteoarthritis of hip: Secondary | ICD-10-CM | POA: Diagnosis not present

## 2019-04-22 ENCOUNTER — Other Ambulatory Visit
Admission: RE | Admit: 2019-04-22 | Discharge: 2019-04-22 | Disposition: A | Discharge status: Home / Self Care | Payer: Medicare HMO | Source: Ambulatory Visit | Attending: Orthopedic Surgery | Admitting: Orthopedic Surgery

## 2019-04-22 ENCOUNTER — Other Ambulatory Visit: Payer: Self-pay

## 2019-04-22 ENCOUNTER — Other Ambulatory Visit: Payer: Medicare HMO

## 2019-04-22 DIAGNOSIS — Z1159 Encounter for screening for other viral diseases: Secondary | ICD-10-CM | POA: Diagnosis not present

## 2019-04-22 DIAGNOSIS — M1611 Unilateral primary osteoarthritis, right hip: Secondary | ICD-10-CM | POA: Insufficient documentation

## 2019-04-22 DIAGNOSIS — I10 Essential (primary) hypertension: Secondary | ICD-10-CM | POA: Diagnosis not present

## 2019-04-22 DIAGNOSIS — Z01818 Encounter for other preprocedural examination: Secondary | ICD-10-CM | POA: Insufficient documentation

## 2019-04-22 LAB — URINALYSIS, ROUTINE W REFLEX MICROSCOPIC
Bacteria, UA: NONE SEEN
Bilirubin Urine: NEGATIVE
Glucose, UA: NEGATIVE mg/dL
Hgb urine dipstick: NEGATIVE
Ketones, ur: NEGATIVE mg/dL
Leukocytes,Ua: NEGATIVE
Nitrite: NEGATIVE
Protein, ur: 30 mg/dL — AB
Specific Gravity, Urine: 1.021 (ref 1.005–1.030)
Squamous Epithelial / HPF: NONE SEEN (ref 0–5)
pH: 6 (ref 5.0–8.0)

## 2019-04-22 NOTE — Patient Instructions (Signed)
Patient given same instructions from previuos visit for this surgery . Surgery cancelled due to Covid. See info from 01/26/19.

## 2019-04-23 LAB — NOVEL CORONAVIRUS, NAA (HOSP ORDER, SEND-OUT TO REF LAB; TAT 18-24 HRS): SARS-CoV-2, NAA: NOT DETECTED

## 2019-04-24 LAB — URINE CULTURE: Culture: <10000 — AB

## 2019-04-25 LAB — TYPE AND SCREEN
ABO/RH(D): AB POS
Antibody Screen: NEGATIVE

## 2019-04-25 MED ORDER — CEFAZOLIN SODIUM-DEXTROSE 2-4 GM/100ML-% IV SOLN
2.0000 g | Freq: Once | INTRAVENOUS | Status: AC
  Administered 2019-04-26: 2 g via INTRAVENOUS

## 2019-04-26 ENCOUNTER — Encounter: Payer: Self-pay | Admitting: *Deleted

## 2019-04-26 ENCOUNTER — Inpatient Hospital Stay: Payer: Medicare HMO | Admitting: Certified Registered Nurse Anesthetist

## 2019-04-26 ENCOUNTER — Encounter
Admission: RE | Disposition: A | Discharge status: Home Health | Payer: Self-pay | Source: Home / Self Care | Attending: Orthopedic Surgery

## 2019-04-26 ENCOUNTER — Inpatient Hospital Stay: Payer: Medicare HMO

## 2019-04-26 ENCOUNTER — Inpatient Hospital Stay
Admission: RE | Admit: 2019-04-26 | Discharge: 2019-04-28 | DRG: 470 | Disposition: A | Discharge status: Home Health | Payer: Medicare HMO | Attending: Orthopedic Surgery | Admitting: Orthopedic Surgery

## 2019-04-26 ENCOUNTER — Other Ambulatory Visit: Payer: Self-pay

## 2019-04-26 DIAGNOSIS — M1611 Unilateral primary osteoarthritis, right hip: Principal | ICD-10-CM | POA: Diagnosis present

## 2019-04-26 DIAGNOSIS — Z419 Encounter for procedure for purposes other than remedying health state, unspecified: Secondary | ICD-10-CM

## 2019-04-26 DIAGNOSIS — Z471 Aftercare following joint replacement surgery: Secondary | ICD-10-CM | POA: Diagnosis not present

## 2019-04-26 DIAGNOSIS — Z96643 Presence of artificial hip joint, bilateral: Secondary | ICD-10-CM

## 2019-04-26 DIAGNOSIS — Z96641 Presence of right artificial hip joint: Secondary | ICD-10-CM | POA: Diagnosis not present

## 2019-04-26 DIAGNOSIS — G8918 Other acute postprocedural pain: Secondary | ICD-10-CM

## 2019-04-26 DIAGNOSIS — N400 Enlarged prostate without lower urinary tract symptoms: Secondary | ICD-10-CM | POA: Diagnosis not present

## 2019-04-26 DIAGNOSIS — D62 Acute posthemorrhagic anemia: Secondary | ICD-10-CM | POA: Diagnosis not present

## 2019-04-26 DIAGNOSIS — I10 Essential (primary) hypertension: Secondary | ICD-10-CM | POA: Diagnosis not present

## 2019-04-26 DIAGNOSIS — Z87891 Personal history of nicotine dependence: Secondary | ICD-10-CM

## 2019-04-26 HISTORY — PX: TOTAL HIP ARTHROPLASTY: SHX124

## 2019-04-26 LAB — SURGICAL PCR SCREEN
MRSA, PCR: NEGATIVE
Staphylococcus aureus: NEGATIVE

## 2019-04-26 LAB — CBC
HCT: 30.9 % — ABNORMAL LOW (ref 39.0–52.0)
Hemoglobin: 10.2 g/dL — ABNORMAL LOW (ref 13.0–17.0)
MCH: 28.2 pg (ref 26.0–34.0)
MCHC: 33 g/dL (ref 30.0–36.0)
MCV: 85.4 fL (ref 80.0–100.0)
Platelets: 466 10*3/uL — ABNORMAL HIGH (ref 150–400)
RBC: 3.62 MIL/uL — ABNORMAL LOW (ref 4.22–5.81)
RDW: 14.4 % (ref 11.5–15.5)
WBC: 14 10*3/uL — ABNORMAL HIGH (ref 4.0–10.5)
nRBC: 0 % (ref 0.0–0.2)

## 2019-04-26 LAB — CREATININE, SERUM
Creatinine, Ser: 0.74 mg/dL (ref 0.61–1.24)
GFR calc Af Amer: >60 mL/min (ref >60)
GFR calc non Af Amer: >60 mL/min (ref >60)

## 2019-04-26 SURGERY — ARTHROPLASTY, HIP, TOTAL, ANTERIOR APPROACH
Anesthesia: General | Site: Hip | Laterality: Right

## 2019-04-26 MED ORDER — PHENYLEPHRINE HCL (PRESSORS) 10 MG/ML IV SOLN
INTRAVENOUS | Status: AC
  Filled 2019-04-26: qty 1

## 2019-04-26 MED ORDER — ACETAMINOPHEN 10 MG/ML IV SOLN
INTRAVENOUS | Status: AC
  Filled 2019-04-26: qty 100

## 2019-04-26 MED ORDER — MAGNESIUM CITRATE PO SOLN
1.0000 | Freq: Once | ORAL | Status: DC | PRN
  Filled 2019-04-26: qty 296

## 2019-04-26 MED ORDER — LACTATED RINGERS IV SOLN
INTRAVENOUS | Status: DC
  Administered 2019-04-26: 07:00:00 via INTRAVENOUS

## 2019-04-26 MED ORDER — CEFAZOLIN SODIUM-DEXTROSE 2-4 GM/100ML-% IV SOLN
2.0000 g | Freq: Four times a day (QID) | INTRAVENOUS | Status: DC
  Administered 2019-04-26: 2 g via INTRAVENOUS
  Filled 2019-04-26 (×2): qty 100

## 2019-04-26 MED ORDER — PROPOFOL 10 MG/ML IV BOLUS
INTRAVENOUS | Status: AC
  Filled 2019-04-26: qty 20

## 2019-04-26 MED ORDER — NEOMYCIN-POLYMYXIN B GU 40-200000 IR SOLN
Status: AC
  Filled 2019-04-26: qty 4

## 2019-04-26 MED ORDER — HYDROCODONE-ACETAMINOPHEN 7.5-325 MG PO TABS: 1.0000 | ORAL_TABLET | ORAL | Status: DC | PRN

## 2019-04-26 MED ORDER — ZOLPIDEM TARTRATE 5 MG PO TABS: 5.0000 mg | ORAL_TABLET | Freq: Every evening | ORAL | Status: DC | PRN

## 2019-04-26 MED ORDER — SODIUM CHLORIDE FLUSH 0.9 % IV SOLN
INTRAVENOUS | Status: AC
  Filled 2019-04-26: qty 10

## 2019-04-26 MED ORDER — ROCURONIUM BROMIDE 100 MG/10ML IV SOLN
INTRAVENOUS | Status: DC | PRN
  Administered 2019-04-26: 50 mg via INTRAVENOUS

## 2019-04-26 MED ORDER — MENTHOL 3 MG MT LOZG
1.0000 | LOZENGE | OROMUCOSAL | Status: DC | PRN
  Filled 2019-04-26: qty 9

## 2019-04-26 MED ORDER — NEOMYCIN-POLYMYXIN B GU 40-200000 IR SOLN
Status: DC | PRN
  Administered 2019-04-26: 4 mL

## 2019-04-26 MED ORDER — SODIUM CHLORIDE 0.9 % IV SOLN
INTRAVENOUS | Status: DC | PRN
  Administered 2019-04-26: 80 mL

## 2019-04-26 MED ORDER — EPHEDRINE SULFATE 50 MG/ML IJ SOLN
INTRAMUSCULAR | Status: DC | PRN
  Administered 2019-04-26: 10 mg via INTRAVENOUS

## 2019-04-26 MED ORDER — BUPIVACAINE-EPINEPHRINE (PF) 0.25% -1:200000 IJ SOLN
INTRAMUSCULAR | Status: AC
  Filled 2019-04-26: qty 30

## 2019-04-26 MED ORDER — DIPHENHYDRAMINE HCL 12.5 MG/5ML PO ELIX: 12.5000 mg | ORAL_SOLUTION | ORAL | Status: DC | PRN

## 2019-04-26 MED ORDER — DEXAMETHASONE SODIUM PHOSPHATE 10 MG/ML IJ SOLN
INTRAMUSCULAR | Status: DC | PRN
  Administered 2019-04-26: 10 mg via INTRAVENOUS

## 2019-04-26 MED ORDER — ALUM & MAG HYDROXIDE-SIMETH 200-200-20 MG/5ML PO SUSP: 30.0000 mL | ORAL | Status: DC | PRN

## 2019-04-26 MED ORDER — SUGAMMADEX SODIUM 200 MG/2ML IV SOLN
INTRAVENOUS | Status: AC
  Filled 2019-04-26: qty 2

## 2019-04-26 MED ORDER — ONDANSETRON HCL 4 MG PO TABS: 4.0000 mg | ORAL_TABLET | Freq: Four times a day (QID) | ORAL | Status: DC | PRN

## 2019-04-26 MED ORDER — FAMOTIDINE 20 MG PO TABS
20.0000 mg | ORAL_TABLET | Freq: Once | ORAL | Status: AC
  Administered 2019-04-26: 07:00:00 20 mg via ORAL

## 2019-04-26 MED ORDER — ACETAMINOPHEN 325 MG PO TABS: 325.0000 mg | ORAL_TABLET | Freq: Four times a day (QID) | ORAL | Status: DC | PRN

## 2019-04-26 MED ORDER — LIDOCAINE HCL (PF) 2 % IJ SOLN
INTRAMUSCULAR | Status: AC
  Filled 2019-04-26: qty 10

## 2019-04-26 MED ORDER — SODIUM CHLORIDE (PF) 0.9 % IJ SOLN
INTRAMUSCULAR | Status: AC
  Filled 2019-04-26: qty 50

## 2019-04-26 MED ORDER — SUGAMMADEX SODIUM 200 MG/2ML IV SOLN
INTRAVENOUS | Status: DC | PRN
  Administered 2019-04-26: 150 mg via INTRAVENOUS

## 2019-04-26 MED ORDER — GABAPENTIN 300 MG PO CAPS
300.0000 mg | ORAL_CAPSULE | Freq: Three times a day (TID) | ORAL | Status: DC
  Administered 2019-04-26 – 2019-04-28 (×6): 300 mg via ORAL
  Filled 2019-04-26 (×6): qty 1

## 2019-04-26 MED ORDER — DOXAZOSIN MESYLATE 4 MG PO TABS
4.0000 mg | ORAL_TABLET | Freq: Every day | ORAL | Status: DC
  Administered 2019-04-27 – 2019-04-28 (×2): 4 mg via ORAL
  Filled 2019-04-26 (×3): qty 1

## 2019-04-26 MED ORDER — METOCLOPRAMIDE HCL 10 MG PO TABS: 5.0000 mg | ORAL_TABLET | Freq: Three times a day (TID) | ORAL | Status: DC | PRN

## 2019-04-26 MED ORDER — PANTOPRAZOLE SODIUM 40 MG PO TBEC
40.0000 mg | DELAYED_RELEASE_TABLET | Freq: Every day | ORAL | Status: DC
  Administered 2019-04-26 – 2019-04-28 (×3): 40 mg via ORAL
  Filled 2019-04-26 (×3): qty 1

## 2019-04-26 MED ORDER — BISACODYL 5 MG PO TBEC: 5.0000 mg | DELAYED_RELEASE_TABLET | Freq: Every day | ORAL | Status: DC | PRN

## 2019-04-26 MED ORDER — TRANEXAMIC ACID-NACL 1000-0.7 MG/100ML-% IV SOLN
INTRAVENOUS | Status: DC | PRN
  Administered 2019-04-26: 1000 mg via INTRAVENOUS

## 2019-04-26 MED ORDER — ACETAMINOPHEN 10 MG/ML IV SOLN
INTRAVENOUS | Status: DC | PRN
  Administered 2019-04-26: 1000 mg via INTRAVENOUS

## 2019-04-26 MED ORDER — BUPIVACAINE-EPINEPHRINE 0.25% -1:200000 IJ SOLN
INTRAMUSCULAR | Status: DC | PRN
  Administered 2019-04-26: 30 mL

## 2019-04-26 MED ORDER — TRANEXAMIC ACID 1000 MG/10ML IV SOLN
INTRAVENOUS | Status: AC
  Filled 2019-04-26: qty 10

## 2019-04-26 MED ORDER — CEFAZOLIN SODIUM-DEXTROSE 2-4 GM/100ML-% IV SOLN
INTRAVENOUS | Status: AC
  Filled 2019-04-26: qty 100

## 2019-04-26 MED ORDER — EPHEDRINE SULFATE 50 MG/ML IJ SOLN
INTRAMUSCULAR | Status: AC
  Filled 2019-04-26: qty 1

## 2019-04-26 MED ORDER — ONDANSETRON HCL 4 MG/2ML IJ SOLN: 4.0000 mg | Freq: Four times a day (QID) | INTRAMUSCULAR | Status: DC | PRN

## 2019-04-26 MED ORDER — LIDOCAINE HCL (CARDIAC) PF 100 MG/5ML IV SOSY
PREFILLED_SYRINGE | INTRAVENOUS | Status: DC | PRN
  Administered 2019-04-26: 80 mg via INTRAVENOUS

## 2019-04-26 MED ORDER — PROPOFOL 10 MG/ML IV BOLUS
INTRAVENOUS | Status: DC | PRN
  Administered 2019-04-26: 150 mg via INTRAVENOUS

## 2019-04-26 MED ORDER — METHOCARBAMOL 500 MG PO TABS
500.0000 mg | ORAL_TABLET | Freq: Four times a day (QID) | ORAL | Status: DC | PRN
  Administered 2019-04-28: 09:00:00 500 mg via ORAL
  Filled 2019-04-26: qty 1

## 2019-04-26 MED ORDER — TRANEXAMIC ACID 1000 MG/10ML IV SOLN: INTRAVENOUS | Status: DC | PRN

## 2019-04-26 MED ORDER — METOCLOPRAMIDE HCL 5 MG/ML IJ SOLN: 5.0000 mg | Freq: Three times a day (TID) | INTRAMUSCULAR | Status: DC | PRN

## 2019-04-26 MED ORDER — SODIUM CHLORIDE 0.9 % IV SOLN
INTRAVENOUS | Status: DC
  Administered 2019-04-26 – 2019-04-27 (×2): via INTRAVENOUS

## 2019-04-26 MED ORDER — ADULT MULTIVITAMIN W/MINERALS CH
1.0000 | ORAL_TABLET | Freq: Every day | ORAL | Status: DC
  Administered 2019-04-26 – 2019-04-28 (×3): 1 via ORAL
  Filled 2019-04-26 (×3): qty 1

## 2019-04-26 MED ORDER — DOCUSATE SODIUM 100 MG PO CAPS
100.0000 mg | ORAL_CAPSULE | Freq: Two times a day (BID) | ORAL | Status: DC
  Administered 2019-04-26 – 2019-04-28 (×5): 100 mg via ORAL
  Filled 2019-04-26 (×5): qty 1

## 2019-04-26 MED ORDER — ONDANSETRON HCL 4 MG/2ML IJ SOLN: 4.0000 mg | Freq: Once | INTRAMUSCULAR | Status: DC | PRN

## 2019-04-26 MED ORDER — ONDANSETRON HCL 4 MG/2ML IJ SOLN
INTRAMUSCULAR | Status: DC | PRN
  Administered 2019-04-26: 4 mg via INTRAVENOUS

## 2019-04-26 MED ORDER — FENTANYL CITRATE (PF) 100 MCG/2ML IJ SOLN: 25.0000 ug | INTRAMUSCULAR | Status: DC | PRN

## 2019-04-26 MED ORDER — PHENOL 1.4 % MT LIQD
1.0000 | OROMUCOSAL | Status: DC | PRN
  Filled 2019-04-26: qty 177

## 2019-04-26 MED ORDER — MAGNESIUM HYDROXIDE 400 MG/5ML PO SUSP
30.0000 mL | Freq: Every day | ORAL | Status: DC | PRN
  Administered 2019-04-27: 09:00:00 30 mL via ORAL
  Filled 2019-04-26: qty 30

## 2019-04-26 MED ORDER — PHENYLEPHRINE HCL (PRESSORS) 10 MG/ML IV SOLN
INTRAVENOUS | Status: DC | PRN
  Administered 2019-04-26: 200 ug via INTRAVENOUS
  Administered 2019-04-26 (×5): 100 ug via INTRAVENOUS

## 2019-04-26 MED ORDER — FINASTERIDE 5 MG PO TABS
5.0000 mg | ORAL_TABLET | Freq: Every day | ORAL | Status: DC
  Administered 2019-04-26 – 2019-04-28 (×3): 5 mg via ORAL
  Filled 2019-04-26 (×3): qty 1

## 2019-04-26 MED ORDER — FAMOTIDINE 20 MG PO TABS
ORAL_TABLET | ORAL | Status: AC
  Administered 2019-04-26: 20 mg via ORAL
  Filled 2019-04-26: qty 1

## 2019-04-26 MED ORDER — FENTANYL CITRATE (PF) 100 MCG/2ML IJ SOLN
INTRAMUSCULAR | Status: DC | PRN
  Administered 2019-04-26: 50 ug via INTRAVENOUS
  Administered 2019-04-26: 100 ug via INTRAVENOUS

## 2019-04-26 MED ORDER — ONDANSETRON HCL 4 MG/2ML IJ SOLN
INTRAMUSCULAR | Status: AC
  Filled 2019-04-26: qty 2

## 2019-04-26 MED ORDER — MORPHINE SULFATE (PF) 2 MG/ML IV SOLN: 0.5000 mg | INTRAVENOUS | Status: DC | PRN

## 2019-04-26 MED ORDER — BUPIVACAINE LIPOSOME 1.3 % IJ SUSP
INTRAMUSCULAR | Status: AC
  Filled 2019-04-26: qty 20

## 2019-04-26 MED ORDER — HYDROCODONE-ACETAMINOPHEN 5-325 MG PO TABS
1.0000 | ORAL_TABLET | ORAL | Status: DC | PRN
  Administered 2019-04-26 (×2): 1 via ORAL
  Filled 2019-04-26 (×2): qty 1

## 2019-04-26 MED ORDER — ROCURONIUM BROMIDE 50 MG/5ML IV SOLN
INTRAVENOUS | Status: AC
  Filled 2019-04-26: qty 1

## 2019-04-26 MED ORDER — DEXAMETHASONE SODIUM PHOSPHATE 10 MG/ML IJ SOLN
INTRAMUSCULAR | Status: AC
  Filled 2019-04-26: qty 1

## 2019-04-26 MED ORDER — ACETAMINOPHEN 500 MG PO TABS
500.0000 mg | ORAL_TABLET | Freq: Four times a day (QID) | ORAL | Status: AC
  Administered 2019-04-26 – 2019-04-27 (×3): 500 mg via ORAL
  Filled 2019-04-26 (×4): qty 1

## 2019-04-26 MED ORDER — CEFAZOLIN SODIUM-DEXTROSE 2-4 GM/100ML-% IV SOLN
2.0000 g | Freq: Four times a day (QID) | INTRAVENOUS | Status: AC
  Administered 2019-04-26: 2 g via INTRAVENOUS
  Filled 2019-04-26: qty 100

## 2019-04-26 MED ORDER — FENTANYL CITRATE (PF) 250 MCG/5ML IJ SOLN
INTRAMUSCULAR | Status: AC
  Filled 2019-04-26: qty 5

## 2019-04-26 MED ORDER — METHOCARBAMOL 1000 MG/10ML IJ SOLN
500.0000 mg | Freq: Four times a day (QID) | INTRAVENOUS | Status: DC | PRN
  Filled 2019-04-26: qty 5

## 2019-04-26 MED ORDER — ENOXAPARIN SODIUM 40 MG/0.4ML ~~LOC~~ SOLN
40.0000 mg | SUBCUTANEOUS | Status: DC
  Filled 2019-04-26: qty 0.4

## 2019-04-26 SURGICAL SUPPLY — 60 items
BLADE SAGITTAL AGGR TOOTH XLG (BLADE) ×2 IMPLANT
BNDG COHESIVE 6X5 TAN STRL LF (GAUZE/BANDAGES/DRESSINGS) ×6 IMPLANT
CANISTER SUCT 1200ML W/VALVE (MISCELLANEOUS) ×2 IMPLANT
CANISTER WOUND CARE 500ML ATS (WOUND CARE) ×2 IMPLANT
CHLORAPREP W/TINT 26 (MISCELLANEOUS) ×2 IMPLANT
COVER WAND RF STERILE (DRAPES) ×2 IMPLANT
DRAPE C-ARM XRAY 36X54 (DRAPES) ×2 IMPLANT
DRAPE INCISE IOBAN 66X60 STRL (DRAPES) IMPLANT
DRAPE POUCH INSTRU U-SHP 10X18 (DRAPES) ×2 IMPLANT
DRAPE SHEET LG 3/4 BI-LAMINATE (DRAPES) ×6 IMPLANT
DRAPE TABLE BACK 80X90 (DRAPES) ×2 IMPLANT
DRESSING SURGICEL FIBRLLR 1X2 (HEMOSTASIS) ×2 IMPLANT
DRSG OPSITE POSTOP 4X8 (GAUZE/BANDAGES/DRESSINGS) ×4 IMPLANT
DRSG SURGICEL FIBRILLAR 1X2 (HEMOSTASIS) ×4
ELECT BLADE 6.5 EXT (BLADE) ×2 IMPLANT
ELECT REM PT RETURN 9FT ADLT (ELECTROSURGICAL) ×2
ELECTRODE REM PT RTRN 9FT ADLT (ELECTROSURGICAL) ×1 IMPLANT
GLOVE BIOGEL PI IND STRL 9 (GLOVE) ×1 IMPLANT
GLOVE BIOGEL PI INDICATOR 9 (GLOVE) ×1
GLOVE SURG SYN 9.0  PF PI (GLOVE) ×2
GLOVE SURG SYN 9.0 PF PI (GLOVE) ×2 IMPLANT
GOWN SRG 2XL LVL 4 RGLN SLV (GOWNS) ×1 IMPLANT
GOWN STRL NON-REIN 2XL LVL4 (GOWNS) ×1
GOWN STRL REUS W/ TWL LRG LVL3 (GOWN DISPOSABLE) ×1 IMPLANT
GOWN STRL REUS W/TWL LRG LVL3 (GOWN DISPOSABLE) ×1
HEMOVAC 400CC 10FR (MISCELLANEOUS) IMPLANT
HIP DBL LINER 54X28 (Liner) ×1 IMPLANT
HIP FEM HD L 28 (Head) ×1 IMPLANT
HOLDER FOLEY CATH W/STRAP (MISCELLANEOUS) ×2 IMPLANT
HOOD PEEL AWAY FLYTE STAYCOOL (MISCELLANEOUS) ×2 IMPLANT
KIT PREVENA INCISION MGT 13 (CANNISTER) ×2 IMPLANT
MAT ABSORB  FLUID 56X50 GRAY (MISCELLANEOUS) ×1
MAT ABSORB FLUID 56X50 GRAY (MISCELLANEOUS) ×1 IMPLANT
NDL SAFETY ECLIPSE 18X1.5 (NEEDLE) ×1 IMPLANT
NDL SPNL 20GX3.5 QUINCKE YW (NEEDLE) ×2 IMPLANT
NEEDLE HYPO 18GX1.5 SHARP (NEEDLE) ×1
NEEDLE SPNL 20GX3.5 QUINCKE YW (NEEDLE) ×4 IMPLANT
NS IRRIG 1000ML POUR BTL (IV SOLUTION) ×2 IMPLANT
PACK HIP COMPR (MISCELLANEOUS) ×2 IMPLANT
SCALPEL PROTECTED #10 DISP (BLADE) ×4 IMPLANT
SHELL ACETABULAR SZ 54 DM (Shell) ×1 IMPLANT
SOL PREP PVP 2OZ (MISCELLANEOUS) ×2
SOLUTION PREP PVP 2OZ (MISCELLANEOUS) ×1 IMPLANT
SPONGE DRAIN TRACH 4X4 STRL 2S (GAUZE/BANDAGES/DRESSINGS) ×2 IMPLANT
STAPLER SKIN PROX 35W (STAPLE) ×2 IMPLANT
STEM FEMORAL SZ3  STD COLLARED (Stem) ×1 IMPLANT
STRAP SAFETY 5IN WIDE (MISCELLANEOUS) ×2 IMPLANT
SUT DVC 2 QUILL PDO  T11 36X36 (SUTURE) ×1
SUT DVC 2 QUILL PDO T11 36X36 (SUTURE) ×1 IMPLANT
SUT SILK 0 (SUTURE) ×1
SUT SILK 0 30XBRD TIE 6 (SUTURE) ×1 IMPLANT
SUT V-LOC 90 ABS DVC 3-0 CL (SUTURE) ×2 IMPLANT
SUT VIC AB 1 CT1 36 (SUTURE) ×2 IMPLANT
SYR 20CC LL (SYRINGE) ×2 IMPLANT
SYR 30ML LL (SYRINGE) ×2 IMPLANT
SYR 50ML LL SCALE MARK (SYRINGE) ×4 IMPLANT
SYR BULB IRRIG 60ML STRL (SYRINGE) ×2 IMPLANT
TAPE MICROFOAM 4IN (TAPE) ×2 IMPLANT
TOWEL OR 17X26 4PK STRL BLUE (TOWEL DISPOSABLE) ×2 IMPLANT
TRAY FOLEY MTR SLVR 16FR STAT (SET/KITS/TRAYS/PACK) ×2 IMPLANT

## 2019-04-26 NOTE — Anesthesia Procedure Notes (Signed)
Procedure Name: Intubation Date/Time: 04/26/2019 7:28 AM Performed by: Eben Burow, CRNA Pre-anesthesia Checklist: Patient identified, Emergency Drugs available, Suction available and Patient being monitored Patient Re-evaluated:Patient Re-evaluated prior to induction Oxygen Delivery Method: Circle system utilized Preoxygenation: Pre-oxygenation with 100% oxygen Induction Type: IV induction Ventilation: Mask ventilation without difficulty Laryngoscope Size: McGraph and 3 Grade View: Grade I Tube type: Oral Tube size: 7.5 mm Number of attempts: 2 Airway Equipment and Method: Stylet and Video-laryngoscopy Placement Confirmation: positive ETCO2 and breath sounds checked- equal and bilateral Secured at: 22 cm Tube secured with: Tape Dental Injury: Teeth and Oropharynx as per pre-operative assessment

## 2019-04-26 NOTE — Anesthesia Post-op Follow-up Note (Signed)
Anesthesia QCDR form completed.        

## 2019-04-26 NOTE — Evaluation (Signed)
Physical Therapy Evaluation Patient Details Name: [name] Foster MRN: [id number] DOB: 1940-12-21 Today's Date: 04/26/2019   History of Present Illness  [name] Foster is a 78yo Male who comes to [facility] on 6/16 for THA c Dr. Rudene Christians. Pt reports Lt THA c Dr. Ollen Gross.  Clinical Impression  Pt admitted with above diagnosis. Pt currently with functional limitations due to the deficits listed below (see "PT Problem List"). Upon entry, pt in bed, awake and agreeable to participate. The pt is alert and oriented x3, pleasant, conversational, and generally a good historian. Moves well in general, pain well managed, no assist needed for OOB or to EOB, but minGuard assist provided for AMB. Do no anticipate difficulty with stairs training prior to DC. Functional mobility assessment demonstrates increased effort/time requirements, poor tolerance, and need for physical assistance, whereas the patient performed these at a higher level of independence PTA. Pt will benefit from skilled PT intervention to increase independence and safety with basic mobility in preparation for discharge to the venue listed below.       Follow Up Recommendations Home health PT;Follow surgeon's recommendation for DC plan and follow-up therapies    Equipment Recommendations  None recommended by PT    Recommendations for Other Services       Precautions / Restrictions Precautions Precautions: None Restrictions Weight Bearing Restrictions: Yes RLE Weight Bearing: Weight bearing as tolerated      Mobility  Bed Mobility Overal bed mobility: Needs Assistance Bed Mobility: Supine to Sit     Supine to sit: Supervision        Transfers Overall transfer level: Needs assistance Equipment used: Rolling walker (2 wheeled) Transfers: Sit to/from Stand Sit to Stand: Supervision            Ambulation/Gait Ambulation/Gait assistance: Min guard Gait Distance (Feet): 120 Feet Assistive device: Rolling walker (2  wheeled) Gait Pattern/deviations: (gradual forward flexion overtime, VC to correct) Gait velocity: 0.7m/s.      Stairs            Wheelchair Mobility    Modified Rankin (Stroke Patients Only)       Balance                                             Pertinent Vitals/Pain Pain Assessment: 0-10 Pain Score: 4  Pain Location: Right hip Pain Descriptors / Indicators: Aching;Operative site guarding Pain Intervention(s): Limited activity within patient's tolerance;Monitored during session;Premedicated before session;Repositioned    Home Living Family/patient expects to be discharged to:: Private residence Living Arrangements: Spouse/significant other Available Help at Discharge: Family Type of Home: House Home Access: Stairs to enter Entrance Stairs-Rails: Right Entrance Stairs-Number of Steps: 3 Home Layout: One level Home Equipment: Environmental consultant - 2 wheels;Cane - single point;Shower seat - built in;Shower seat      Prior Function Level of Independence: Independent         Comments: SPC use for HH and limited community distances.     Hand Dominance        Extremity/Trunk Assessment                Communication   Communication: No difficulties  Cognition Arousal/Alertness: Awake/alert Behavior During Therapy: WFL for tasks assessed/performed Overall Cognitive Status: Within Functional Limits for tasks assessed  General Comments      Exercises Total Joint Exercises Ankle Circles/Pumps: AROM;Both;20 reps;Supine Short Arc Quad: AAROM;Right;10 reps;Supine Heel Slides: AAROM;Right;15 reps;Supine Hip ABduction/ADduction: AAROM;Right;15 reps;Supine   Assessment/Plan    PT Assessment Patient needs continued PT services  PT Problem List Decreased range of motion;Decreased strength;Decreased mobility       PT Treatment Interventions DME instruction;Gait training;Stair  training;Functional mobility training;Therapeutic exercise    PT Goals (Current goals can be found in the Care Plan section)  Acute Rehab PT Goals Patient Stated Goal: return to home, have a good outcome similar to prior THA PT Goal Formulation: With patient Time For Goal Achievement: 05/10/19 Potential to Achieve Goals: Good    Frequency BID   Barriers to discharge        Co-evaluation               AM-PAC PT "6 Clicks" Mobility  Outcome Measure Help needed turning from your back to your side while in a flat bed without using bedrails?: A Little Help needed moving from lying on your back to sitting on the side of a flat bed without using bedrails?: A Little Help needed moving to and from a bed to a chair (including a wheelchair)?: A Little Help needed standing up from a chair using your arms (e.g., wheelchair or bedside chair)?: A Little Help needed to walk in hospital room?: A Little Help needed climbing 3-5 steps with a railing? : A Little 6 Click Score: 18    End of Session Equipment Utilized During Treatment: Gait belt Activity Tolerance: Patient tolerated treatment well;No increased pain Patient left: in chair;with call bell/phone within reach;with bed alarm set;with SCD's reapplied Nurse Communication: Mobility status PT Visit Diagnosis: Difficulty in walking, not elsewhere classified (R26.2);Other abnormalities of gait and mobility (R26.89)    Time: 2683-4196 PT Time Calculation (min) (ACUTE ONLY): 32 min   Charges:   PT Evaluation $PT Eval Low Complexity: 1 Low PT Treatments $Gait Training: 8-22 mins $Therapeutic Exercise: 8-22 mins        4:50 PM, 04/26/19 Etta Grandchild, PT, DPT Physical Therapist - [facility]  [phone] ([place])    [place] C 04/26/2019, 4:49 PM

## 2019-04-26 NOTE — H&P (Signed)
Reviewed paper H+P, will be scanned into chart. No changes noted.  

## 2019-04-26 NOTE — TOC Benefit Eligibility Note (Signed)
Transition of Care [facility]) Benefit Eligibility Note    Patient Details  Name: [name] Foster MRN: [id number] Date of Birth: 1940/12/18   Medication/Dose: Enoxaparin 40 mg, daily x 14 days  Covered?: Yes(Generic - Enoxaparin)  Tier: Other(Tier 4)  Prescription Coverage Preferred Pharmacy: [facility] with Person/Company/Phone Number:: [place], [employer] [place], [phone]  Co-Pay: $79.62  Prior Approval: No  Deductible: (No bearing on cost)  Additional Notes: Lovenox not on formulary    [place] Phone Number: 04/26/2019, 10:37 AM

## 2019-04-26 NOTE — Op Note (Signed)
04/26/2019  9:03 AM  PATIENT:  [name] Foster  78 y.o. male  PRE-OPERATIVE DIAGNOSIS:  Primary osteoarthritis of right hip  POST-OPERATIVE DIAGNOSIS:  Primary osteoarthritis of right hip  PROCEDURE:  Procedure(s): TOTAL HIP ARTHROPLASTY ANTERIOR APPROACH (Right)  SURGEON: Laurene Footman, MD  ASSISTANTS: None  ANESTHESIA:   general  EBL:  Total I/O In: 900 [I.V.:900] Out: 800 [Urine:50; Blood:750]  BLOOD ADMINISTERED:none  DRAINS: none   LOCAL MEDICATIONS USED:  MARCAINE    and OTHER Exparel  SPECIMEN:  Source of Specimen:  Right femoral head  DISPOSITION OF SPECIMEN:  PATHOLOGY  COUNTS:  YES  TOURNIQUET:  * No tourniquets in log *  IMPLANTS: Medacta AMIS standard 3, 54 mm mPACT TM cup and liner with 28 mm L metal head  DICTATION: .Dragon Dictation   The patient was brought to the operating room and after general anesthesia was obtained patient was placed on the operative table with the ipsilateral foot into the Medacta attachment, contralateral leg on a well-padded table. C-arm was brought in and preop template x-ray taken. After prepping and draping in usual sterile fashion appropriate patient identification and timeout procedures were completed. Anterior approach to the hip was obtained and centered over the greater trochanter and TFL muscle. The subcutaneous tissue was incised hemostasis being achieved by electrocautery. TFL fascia was incised and the muscle retracted laterally deep retractor placed. The lateral femoral circumflex vessels were identified and ligated. The anterior capsule was exposed and a capsulotomy performed. The neck was identified and a femoral neck cut carried out with a saw. The head was removed without difficulty and showed sclerotic femoral head and acetabulum. Reaming was carried out to 54 mm and a 54 mm cup trial gave appropriate tightness to the acetabular component a 54 DM cup was impacted into position. The leg was then externally rotated and  ischiofemoral and pubofemoral releases carried out. The femur was sequentially broached to a size 3, size 3 standard with as had trials were placed and the final components chosen. The 3 standard stem was inserted along with a metal L 28 mm head and 54 mm liner. The hip was reduced and was stable the wound was thoroughly irrigated with fibrillar placed along the posterior capsule and medial neck.  Exparel was injected throughout the case for postop analgesia the deep fascia ws closed using a heavy Quill after infiltration of 30 cc of quarter percent Sensorcaine with epinephrine.3-0 V-loc to close the skin with skin staples.  Incisional wound VAC applied and patient was sent to recovery in stable condition.   PLAN OF CARE: Admit to inpatient

## 2019-04-26 NOTE — Anesthesia Preprocedure Evaluation (Signed)
Anesthesia Evaluation  Patient identified by MRN, date of birth, ID band Patient awake    Reviewed: Allergy & Precautions, H&P , NPO status , Patient's Chart, lab work & pertinent test results, reviewed documented beta blocker date and time   Airway Mallampati: II  TM Distance: >3 FB Neck ROM: full    Dental  (+) Teeth Intact   Pulmonary neg pulmonary ROS, former smoker,    Pulmonary exam normal        Cardiovascular Exercise Tolerance: Poor hypertension, On Medications negative cardio ROS Normal cardiovascular exam Rhythm:regular Rate:Normal     Neuro/Psych negative neurological ROS  negative psych ROS   GI/Hepatic negative GI ROS, Neg liver ROS,   Endo/Other  negative endocrine ROS  Renal/GU negative Renal ROS  negative genitourinary   Musculoskeletal   Abdominal   Peds  Hematology negative hematology ROS (+)   Anesthesia Other Findings Past Medical History: No date: Arthritis No date: BPH (benign prostatic hyperplasia) No date: Former smoker, stopped smoking many years ago 2013: History of needle biopsy of prostate with negative result No date: Hypertension No date: 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 Past Surgical History: No date: HEMORROIDECTOMY No date: HERNIA REPAIR No date: JOINT REPLACEMENT; Left     Comment:  THR 2013: PROSTATE BIOPSY No date: TOTAL HIP ARTHROPLASTY; Left   Reproductive/Obstetrics negative OB ROS                             Anesthesia Physical Anesthesia Plan  ASA: III  Anesthesia Plan: General ETT   Post-op Pain Management:    Induction:   PONV Risk Score and Plan:   Airway Management Planned:   Additional Equipment:   Intra-op Plan:   Post-operative Plan:   Informed Consent: I have reviewed the patients History and Physical, chart, labs and discussed the procedure including the risks, benefits and  alternatives for the proposed anesthesia with the patient or authorized representative who has indicated his/her understanding and acceptance.     Dental Advisory Given  Plan Discussed with: CRNA  Anesthesia Plan Comments:         Anesthesia Quick Evaluation

## 2019-04-26 NOTE — Progress Notes (Signed)
Called wife [place], [phone]. Male who answered identified herself as [name], pt's daughter as his wife "stepped away from the phone". With patient's permission, I spoke with daughter to notify her of patient room #147. Bridget expressed appreciaiton and states she will notify Roselyn.

## 2019-04-26 NOTE — TOC Progression Note (Signed)
Transition of Care [facility]) - Progression Note    Patient Details  Name: [name] Foster MRN: [id number] Date of Birth: [birth year]  Transition of Care [facility]) CM/SW [place], RN Phone Number: 04/26/2019, 8:42 AM  Clinical Narrative:    Requested the price for lovenox, will notify the patient once obtained        Expected Discharge Plan and Services                                                 Social Determinants of Health (SDOH) Interventions    Readmission Risk Interventions No flowsheet data found.

## 2019-04-26 NOTE — Transfer of Care (Signed)
Immediate Anesthesia Transfer of Care Note  Patient: [name] Foster  Procedure(s) Performed: TOTAL HIP ARTHROPLASTY ANTERIOR APPROACH (Right Hip)  Patient Location: PACU  Anesthesia Type:General  Level of Consciousness: drowsy  Airway & Oxygen Therapy: Patient Spontanous Breathing and Patient connected to face mask oxygen  Post-op Assessment: Report given to RN and Post -op Vital signs reviewed and stable  Post vital signs: Reviewed and stable  Last Vitals:  Vitals Value Taken Time  BP 114/63 04/26/19 0905  Temp 36.6 C 04/26/19 0905  Pulse 82 04/26/19 0908  Resp 24 04/26/19 0908  SpO2 100 % 04/26/19 0908  Vitals shown include unvalidated device data.  Last Pain:  Vitals:   04/26/19 0627  TempSrc: Tympanic  PainSc: 7          Complications: No apparent anesthesia complications

## 2019-04-26 NOTE — Progress Notes (Signed)
Pt belonging bag which included cell phone, glasses and one ring returned to patient. In room 147

## 2019-04-27 LAB — CBC
HCT: 30.9 % — ABNORMAL LOW (ref 39.0–52.0)
Hemoglobin: 10.1 g/dL — ABNORMAL LOW (ref 13.0–17.0)
MCH: 28.5 pg (ref 26.0–34.0)
MCHC: 32.7 g/dL (ref 30.0–36.0)
MCV: 87 fL (ref 80.0–100.0)
Platelets: 518 10*3/uL — ABNORMAL HIGH (ref 150–400)
RBC: 3.55 MIL/uL — ABNORMAL LOW (ref 4.22–5.81)
RDW: 14.3 % (ref 11.5–15.5)
WBC: 9.9 10*3/uL (ref 4.0–10.5)
nRBC: 0 % (ref 0.0–0.2)

## 2019-04-27 LAB — BASIC METABOLIC PANEL
Anion gap: 11 (ref 5–15)
BUN: 13 mg/dL (ref 8–23)
CO2: 26 mmol/L (ref 22–32)
Calcium: 8.2 mg/dL — ABNORMAL LOW (ref 8.9–10.3)
Chloride: 96 mmol/L — ABNORMAL LOW (ref 98–111)
Creatinine, Ser: 0.86 mg/dL (ref 0.61–1.24)
GFR calc Af Amer: >60 mL/min (ref >60)
GFR calc non Af Amer: >60 mL/min (ref >60)
Glucose, Bld: 146 mg/dL — ABNORMAL HIGH (ref 70–99)
Potassium: 4 mmol/L (ref 3.5–5.1)
Sodium: 133 mmol/L — ABNORMAL LOW (ref 135–145)

## 2019-04-27 MED ORDER — ASPIRIN 81 MG PO CHEW
81.0000 mg | CHEWABLE_TABLET | Freq: Two times a day (BID) | ORAL | Status: DC
  Administered 2019-04-27 – 2019-04-28 (×3): 81 mg via ORAL
  Filled 2019-04-27 (×2): qty 1

## 2019-04-27 MED ORDER — DOCUSATE SODIUM 100 MG PO CAPS: 100.0000 mg | ORAL_CAPSULE | Freq: Two times a day (BID) | ORAL | 0 refills | Status: DC

## 2019-04-27 MED ORDER — ASPIRIN 81 MG PO CHEW: 81.0000 mg | CHEWABLE_TABLET | Freq: Two times a day (BID) | ORAL | 0 refills | Status: AC

## 2019-04-27 MED ORDER — BISACODYL 10 MG RE SUPP
10.0000 mg | Freq: Once | RECTAL | Status: AC
  Administered 2019-04-27: 10 mg via RECTAL
  Filled 2019-04-27: qty 1

## 2019-04-27 MED ORDER — HYDROCODONE-ACETAMINOPHEN 5-325 MG PO TABS: 1.0000 | ORAL_TABLET | ORAL | 0 refills | Status: DC | PRN

## 2019-04-27 NOTE — TOC Initial Note (Signed)
Transition of Care [facility]) - Initial/Assessment Note    Patient Details  Name: [name] Foster MRN: [id number] Date of Birth: [birth year]  Transition of Care [facility]) CM/SW Contact:    [name] Foster, [name] Foster Phone Number: [phone]  04/27/2019, 2:37 PM  Clinical Narrative: Clinical Social Worker (CSW) received consult to coordinate D/C plan. PT is recommending home health. CSW met with patient alone at bedside. Patient was alert and oriented X4 and was sitting up in the chair at bedside. CSW introduced self and explained role of CSW department. Per patient he lives in [place] with his wife [name] Foster who can provide 24/7 supervision for patient at home. CSW explained home health process and that patient's surgeon prefers [facility]. CSW also provided patient list of home health agencies. [facility] chose [facility]. Per Helene Kelp [facility] representative they can accept patient. Per patient he has a rolling walker and bedside commode at home. Patient has no equipment needs. CSW notified patient of Lovenox price $79.62. Per RN patient has refused Lovenox and per MD he will be put on aspirin. CSW will continue to follow and assist as needed.               Expected Discharge Plan: [place] Barriers to Discharge: Continued Medical Work up   Patient Goals and CMS Choice Patient states their goals for this hospitalization and ongoing recovery are:: Pain control. CMS Medicare.gov Compare Post Acute Care list provided to:: Patient Choice offered to / list presented to : Patient  Expected Discharge Plan and Services Expected Discharge Plan: [place] In-house Referral: Clinical Social Work Discharge Planning Services: CM Consult Post Acute Care Choice: [place] arrangements for the past 2 months: [place]: PT [place]: [facility] [employer] Date [place]:  04/27/19 Time [place]: 0900 Representative spoke with at [place]: [place] Arrangements/Services Living arrangements for the past 2 months: [place] with:: Spouse Patient language and need for interpreter reviewed:: No Do you feel safe going back to the place where you live?: Yes      Need for Family Participation in Patient Care: No (Comment) Care giver support system in place?: Yes (comment) Current home services: DME(Patient has a rolling walker and bedside commode at home.) Criminal Activity/Legal Involvement Pertinent to Current Situation/Hospitalization: No - Comment as needed  Activities of Daily Living Home Assistive Devices/Equipment: Cane (specify quad or straight), Eyeglasses, Blood pressure cuff, Walker (specify type), Other (Comment)(rolling) ADL Screening (condition at time of admission) Patient's cognitive ability adequate to safely complete daily activities?: Yes Is the patient deaf or have difficulty hearing?: No Does the patient have difficulty seeing, even when wearing glasses/contacts?: No Does the patient have difficulty concentrating, remembering, or making decisions?: No Patient able to express need for assistance with ADLs?: Yes Does the patient have difficulty dressing or bathing?: No Independently performs ADLs?: Yes (appropriate for developmental age) Does the patient have difficulty walking or climbing stairs?: Yes Weakness of Legs: Right Weakness of Arms/Hands: None  Permission Sought/Granted Permission sought to share information with : Other (comment)(Home Health agency) Permission granted to share information with : Yes, Verbal Permission Granted              Emotional Assessment Appearance:: Appears  stated age   Affect (typically observed): Accepting, Adaptable, Pleasant Orientation: : Oriented to Self, Oriented to Place, Oriented to  Time, Oriented to Situation Alcohol / Substance Use: Not  Applicable Psych Involvement: No (comment)  Admission diagnosis:  Primary osteoarthritis of right hip Patient Active Problem List   Diagnosis Date Noted  . 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 Neutropenia ([place]) 06/04/2017  . Incomplete emptying of bladder 02/03/2017  . 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 Abnormal finding on chest xray 07/18/2013  . Chronic cough 07/18/2013  . Tests ordered 09/03/2012  . 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 Benign localized prostatic hyperplasia with lower urinary tract symptoms (LUTS) 07/07/2012  . Elevated prostate specific antigen (PSA) 07/07/2012   PCP:  Patient, No Pcp Per Pharmacy:   [facility] [place] [address] Phone: [phone] Fax: [phone]  [facility] [address] [place] [address] Phone: [phone] Fax: [phone]     Social Determinants of Health (SDOH) Interventions    Readmission Risk Interventions No flowsheet data found.

## 2019-04-27 NOTE — Progress Notes (Signed)
   Subjective: 1 Day Post-Op Procedure(s) (LRB): TOTAL HIP ARTHROPLASTY ANTERIOR APPROACH (Right) Patient reports pain as mild.   Patient is well, and has had no acute complaints or problems Denies any CP, SOB, ABD pain. We will continue therapy today.  Plan is to go Home after hospital stay.  Objective: Vital signs in last 24 hours: Temp:  [97.5 F (36.4 C)-98.1 F (36.7 C)] 97.5 F (36.4 C) (06/17 0611) Pulse Rate:  [69-86] 72 (06/17 0611) Resp:  [13-26] 18 (06/17 0611) BP: (114-135)/(51-81) 130/65 (06/17 0611) SpO2:  [93 %-100 %] 96 % (06/17 0611)  Intake/Output from previous day: 06/16 0701 - 06/17 0700 In: 3360.5 [P.O.:260; I.V.:2500.5; IV Piggyback:100] Out: 3310 [Urine:2560; Blood:750] Intake/Output this shift: No intake/output data recorded.  Recent Labs    04/26/19 1257 04/27/19 0533  HGB 10.2* 10.1*   Recent Labs    04/26/19 1257 04/27/19 0533  WBC 14.0* 9.9  RBC 3.62* 3.55*  HCT 30.9* 30.9*  PLT 466* 518*   Recent Labs    04/26/19 1257 04/27/19 0533  NA  --  133*  K  --  4.0  CL  --  96*  CO2  --  26  BUN  --  13  CREATININE 0.74 0.86  GLUCOSE  --  146*  CALCIUM  --  8.2*   No results for input(s): LABPT, INR in the last 72 hours.  EXAM General - Patient is Alert, Appropriate and Oriented Extremity - Neurovascular intact Sensation intact distally Intact pulses distally Dorsiflexion/Plantar flexion intact No cellulitis present Compartment soft Dressing - dressing C/D/I and no drainage, Praveena intact without drainage Motor Function - intact, moving foot and toes well on exam.   Past Medical History:  Diagnosis Date  . Arthritis   . BPH (benign prostatic hyperplasia)   . Former smoker, stopped smoking many years ago   . History of needle biopsy of prostate with negative result 2013  . Hypertension   . Prostate enlargement   . Prostatic intraepithelial neoplasm III 2013    Assessment/Plan:   1 Day Post-Op Procedure(s)  (LRB): TOTAL HIP ARTHROPLASTY ANTERIOR APPROACH (Right) Active Problems:   Status post total hip replacement, right  Estimated body mass index is 26.61 kg/m as calculated from the following:   Height as of 04/22/19: 5\' 8"  (1.727 m).   Weight as of 04/22/19: 79.4 kg. Advance diet Up with therapy  Needs bowel movement Labs stable Vital signs stable Pain well controlled Recheck labs in the morning Care management to assist with discharge  DVT Prophylaxis - Lovenox, TED hose and SCDs Weight-Bearing as tolerated to right leg   T. Rachelle Hora, PA-C [place] 04/27/2019, 8:07 AM

## 2019-04-27 NOTE — Progress Notes (Signed)
Spoke to pt for the second time in an effort to educate him about the importance of his Lovenox shot. Pt advised he does not like needles nor shots and would like to see if provider has an alternative for the shot. Sent provider message advising

## 2019-04-27 NOTE — Progress Notes (Signed)
Physical Therapy Treatment Patient Details Name: [name] Foster MRN: [id number] DOB: 1941-10-11 Today's Date: 04/27/2019    History of Present Illness [name] Foster is a 78yo Male who comes to [facility] on 6/16 for THA c Dr. Rudene Christians. Pt reports Lt THA c Dr. Ollen Gross.    PT Comments    Author arriving for PM session, pt just finished a nap. Pt AMB well, progressing AMB distance significantly while showing gait speed improvement as well. Reviewed all of patient's HEP in full with hand out. Pt unable to perform SLR, ABDCT/ADD without physical assist. Heel slides are difficult, but tolerated. Pt reports wife can assist with HEP activities that are difficult to perform. Pt progressing well in general. He reports good confidence in readiness for DC to home.    Follow Up Recommendations  Home health PT;Follow surgeon's recommendation for DC plan and follow-up therapies     Equipment Recommendations  None recommended by PT    Recommendations for Other Services       Precautions / Restrictions Precautions Precautions: None Restrictions RLE Weight Bearing: Weight bearing as tolerated    Mobility  Bed Mobility Overal bed mobility: Needs Assistance Bed Mobility: Supine to Sit     Supine to sit: Modified independent (Device/Increase time)     General bed mobility comments: in chair upon entry  Transfers Overall transfer level: Needs assistance Equipment used: None Transfers: Sit to/from Stand Sit to Stand: Supervision;From elevated surface         General transfer comment: twice  Ambulation/Gait Ambulation/Gait assistance: Min guard Gait Distance (Feet): 275 Feet Assistive device: Rolling walker (2 wheeled) Gait Pattern/deviations: WFL(Within Functional Limits) Gait velocity: 0.70m/s (0.80m/s this morning; 0.49m/s 1DA)   General Gait Details: multimodal cues to stand tall and to attempt a 2-point step-through gait with RW, which he is able to.   Stairs Stairs:  Yes Stairs assistance: Modified independent (Device/Increase time) Stair Management: One rail Right;One rail Left;Step to pattern;Forwards Number of Stairs: 4 General stair comments: needs cues to come "down with the bad"   Wheelchair Mobility    Modified Rankin (Stroke Patients Only)       Balance Overall balance assessment: Modified Independent;No apparent balance deficits (not formally assessed)                                          Cognition Arousal/Alertness: Awake/alert Behavior During Therapy: WFL for tasks assessed/performed Overall Cognitive Status: Within Functional Limits for tasks assessed                                        Exercises Total Joint Exercises Ankle Circles/Pumps: AROM;Both;20 reps;Seated;Limitations Ankle Circles/Pumps Limitations: gravity resisted Quad Sets: AROM;Strengthening;Right;10 reps;Limitations Quad Sets Limitations: 3SecH Gluteal Sets: AROM;Strengthening;Both;10 reps;Supine;Limitations Gluteal Sets Limitations: 3secH Towel Squeeze: Strengthening;Supine;10 reps;Limitations Towel Squeeze Limitations: 3secH Short Arc Quad: AAROM;Right;10 reps;Supine;PROM Heel Slides: Right;Supine;10 reps;PROM;Limitations Heel Slides Limitations: most difficult Hip ABduction/ADduction: Right;Supine;10 reps;Limitations;AAROM Hip Abduction/Adduction Limitations: unable to perform without assist Long Arc Quad: AROM;Right;10 reps;Seated    General Comments        Pertinent Vitals/Pain Pain Assessment: 0-10 Pain Score: 3  Pain Location: Right hip Pain Descriptors / Indicators: Aching;Operative site guarding Pain Intervention(s): Limited activity within patient's tolerance;Monitored during session;Premedicated before session;RN gave pain meds during session    Home  Living                      Prior Function            PT Goals (current goals can now be found in the care plan section) Acute Rehab PT  Goals Patient Stated Goal: return to home, have a good outcome similar to prior THA PT Goal Formulation: With patient Time For Goal Achievement: 05/10/19 Potential to Achieve Goals: Good Progress towards PT goals: Progressing toward goals    Frequency    BID      PT Plan Current plan remains appropriate    Co-evaluation              AM-PAC PT "6 Clicks" Mobility   Outcome Measure  Help needed turning from your back to your side while in a flat bed without using bedrails?: A Little Help needed moving from lying on your back to sitting on the side of a flat bed without using bedrails?: A Little Help needed moving to and from a bed to a chair (including a wheelchair)?: A Little Help needed standing up from a chair using your arms (e.g., wheelchair or bedside chair)?: A Little Help needed to walk in hospital room?: A Little Help needed climbing 3-5 steps with a railing? : A Little 6 Click Score: 18    End of Session Equipment Utilized During Treatment: Gait belt Activity Tolerance: Patient tolerated treatment well;No increased pain Patient left: in chair;with call bell/phone within reach Nurse Communication: Mobility status PT Visit Diagnosis: Difficulty in walking, not elsewhere classified (R26.2);Other abnormalities of gait and mobility (R26.89)     Time: 2707-8675 PT Time Calculation (min) (ACUTE ONLY): 29 min  Charges:  $Gait Training: 8-22 mins $Therapeutic Exercise: 8-22 mins                     3:39 PM, 04/27/19 Etta Grandchild, PT, DPT Physical Therapist - [facility]  [phone] ([place])    [place] C 04/27/2019, 3:34 PM

## 2019-04-27 NOTE — Progress Notes (Signed)
Dr. Rudene Christians called and informed that pt refuses Lovenox for prophylaxis. Verbal order for aspirin 81 mg BID received.

## 2019-04-27 NOTE — Discharge Instructions (Signed)
ANTERIOR APPROACH TOTAL HIP REPLACEMENT POSTOPERATIVE DIRECTIONS   Hip Rehabilitation, Guidelines Following Surgery  The results of a hip operation are greatly improved after range of motion and muscle strengthening exercises. Follow all safety measures which are given to protect your hip. If any of these exercises cause increased pain or swelling in your joint, decrease the amount until you are comfortable again. Then slowly increase the exercises. Call your caregiver if you have problems or questions.   HOME CARE INSTRUCTIONS  Remove items at home which could result in a fall. This includes throw rugs or furniture in walking pathways.   ICE to the affected hip every three hours for 30 minutes at a time and then as needed for pain and swelling.  Continue to use ice on the hip for pain and swelling from surgery. You may notice swelling that will progress down to the foot and ankle.  This is normal after surgery.  Elevate the leg when you are not up walking on it.    Continue to use the breathing machine which will help keep your temperature down.  It is common for your temperature to cycle up and down following surgery, especially at night when you are not up moving around and exerting yourself.  The breathing machine keeps your lungs expanded and your temperature down.  Do not place pillow under knee, focus on keeping the knee straight while resting  DIET You may resume your previous home diet once your are discharged from the hospital.  DRESSING / WOUND CARE / SHOWERING Please remove provena negative pressure dressing on 05/05/2019 and apply honey comb dressing. Keep dressing clean and dry at all times.   ACTIVITY Walk with your walker as instructed. Use walker as long as suggested by your caregivers. Avoid periods of inactivity such as sitting longer than an hour when not asleep. This helps prevent blood clots.  You may resume a sexual relationship in one month or when given the OK by  your doctor.  You may return to work once you are cleared by your doctor.  Do not drive a car for 6 weeks or until released by you surgeon.  Do not drive while taking narcotics.  WEIGHT BEARING Weight bearing as tolerated. Use walker/cane as needed for at least 4 weeks post op.  POSTOPERATIVE CONSTIPATION PROTOCOL Constipation - defined medically as fewer than three stools per week and severe constipation as less than one stool per week.  One of the most common issues patients have following surgery is constipation.  Even if you have a regular bowel pattern at home, your normal regimen is likely to be disrupted due to multiple reasons following surgery.  Combination of anesthesia, postoperative narcotics, change in appetite and fluid intake all can affect your bowels.  In order to avoid complications following surgery, here are some recommendations in order to help you during your recovery period.  Colace (docusate) - Pick up an over-the-counter form of Colace or another stool softener and take twice a day as long as you are requiring postoperative pain medications.  Take with a full glass of water daily.  If you experience loose stools or diarrhea, hold the colace until you stool forms back up.  If your symptoms do not get better within 1 week or if they get worse, check with your doctor.  Dulcolax (bisacodyl) - Pick up over-the-counter and take as directed by the product packaging as needed to assist with the movement of your bowels.  Take with a  full glass of water.  Use this product as needed if not relieved by Colace only.  ° °MiraLax (polyethylene glycol) - Pick up over-the-counter to have on hand.  MiraLax is a solution that will increase the amount of water in your bowels to assist with bowel movements.  Take as directed and can mix with a glass of water, juice, soda, coffee, or tea.  Take if you go more than two days without a movement. °Do not use MiraLax more than once per day. Call your  doctor if you are still constipated or irregular after using this medication for 7 days in a row. ° °If you continue to have problems with postoperative constipation, please contact the office for further assistance and recommendations.  If you experience "the worst abdominal pain ever" or develop nausea or vomiting, please contact the office immediatly for further recommendations for treatment. ° °ITCHING ° If you experience itching with your medications, try taking only a single pain pill, or even half a pain pill at a time.  You can also use Benadryl over the counter for itching or also to help with sleep.  ° °TED HOSE STOCKINGS °Wear the elastic stockings on both legs for six weeks following surgery during the day but you may remove then at night for sleeping. ° °MEDICATIONS °See your medication summary on the “After Visit Summary” that the nursing staff will review with you prior to discharge.  You may have some home medications which will be placed on hold until you complete the course of blood thinner medication.  It is important for you to complete the blood thinner medication as prescribed by your surgeon.  Continue your approved medications as instructed at time of discharge. ° °PRECAUTIONS °If you experience chest pain or shortness of breath - call 911 immediately for transfer to the hospital emergency department.  °If you develop a fever greater that 101 F, purulent drainage from wound, increased redness or drainage from wound, foul odor from the wound/dressing, or calf pain - CONTACT YOUR SURGEON.   °                                                °FOLLOW-UP APPOINTMENTS °Make sure you keep all of your appointments after your operation with your surgeon and caregivers. You should call the office at the above phone number and make an appointment for approximately two weeks after the date of your surgery or on the date instructed by your surgeon outlined in the "After Visit Summary". ° °RANGE OF MOTION  AND STRENGTHENING EXERCISES  °These exercises are designed to help you keep full movement of your hip joint. Follow your caregiver's or physical therapist's instructions. Perform all exercises about fifteen times, three times per day or as directed. Exercise both hips, even if you have had only one joint replacement. These exercises can be done on a training (exercise) mat, on the floor, on a table or on a bed. Use whatever works the best and is most comfortable for you. Use music or television while you are exercising so that the exercises are a pleasant break in your day. This will make your life better with the exercises acting as a break in routine you can look forward to.  °Lying on your back, slowly slide your foot toward your buttocks, raising your knee up off the floor. Then   slowly slide your foot back down until your leg is straight again.  °Lying on your back spread your legs as far apart as you can without causing discomfort.  °Lying on your side, raise your upper leg and foot straight up from the floor as far as is comfortable. Slowly lower the leg and repeat.  °Lying on your back, tighten up the muscle in the front of your thigh (quadriceps muscles). You can do this by keeping your leg straight and trying to raise your heel off the floor. This helps strengthen the largest muscle supporting your knee.  °Lying on your back, tighten up the muscles of your buttocks both with the legs straight and with the knee bent at a comfortable angle while keeping your heel on the floor.  ° °IF YOU ARE TRANSFERRED TO A SKILLED REHAB FACILITY °If the patient is transferred to a skilled rehab facility following release from the hospital, a list of the current medications will be sent to the facility for the patient to continue.  When discharged from the skilled rehab facility, please have the facility set up the patient's Home Health Physical Therapy prior to being released. Also, the skilled facility will be responsible  for providing the patient with their medications at time of release from the facility to include their pain medication, the muscle relaxants, and their blood thinner medication. If the patient is still at the rehab facility at time of the two week follow up appointment, the skilled rehab facility will also need to assist the patient in arranging follow up appointment in our office and any transportation needs. ° °MAKE SURE YOU:  °Understand these instructions.  °Get help right away if you are not doing well or get worse.  ° ° °Pick up stool softner and laxative for home use following surgery while on pain medications. °Continue to use ice for pain and swelling after surgery. °Do not use any lotions or creams on the incision until instructed by your surgeon. ° °

## 2019-04-27 NOTE — Discharge Summary (Signed)
Physician Discharge Summary  Patient ID: [name] Foster MRN: [id number] DOB/AGE: [birth year] 78 y.o.  Admit date: 04/26/2019 Discharge date: 04/28/2019 Admission Diagnoses:  Primary osteoarthritis of right hip   Discharge Diagnoses: Patient Active Problem List   Diagnosis Date Noted  . 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 Neutropenia ([place]) 06/04/2017  . Incomplete emptying of bladder 02/03/2017  . 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 Abnormal finding on chest xray 07/18/2013  . Chronic cough 07/18/2013  . Tests ordered 09/03/2012  . 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 Benign localized prostatic hyperplasia with lower urinary tract symptoms (LUTS) 07/07/2012  . Elevated prostate specific antigen (PSA) 07/07/2012    Past Medical History:  Diagnosis Date  . Arthritis   . BPH (benign prostatic hyperplasia)   . Former smoker, stopped smoking many years ago   . History of needle biopsy of prostate with negative result 2013  . Hypertension   . Prostate enlargement   . Prostatic intraepithelial neoplasm III 2013     Transfusion: none   Consultants (if any):   Discharged Condition: Improved  Hospital Course: [name] Foster is an 78 y.o. male who was admitted 04/26/2019 with a diagnosis of <principal problem not specified> and went to the operating room on 04/26/2019 and underwent the above named procedures.    Surgeries: Procedure(s): TOTAL HIP ARTHROPLASTY ANTERIOR APPROACH on 04/26/2019 Patient tolerated the surgery well. Taken to PACU where she was stabilized and then transferred to the orthopedic floor.  Started on aspirin 81 mg BID. Foot pumps applied bilaterally at 80 mm. Heels elevated on bed with rolled towels. No evidence of DVT. Negative Homan. Physical therapy started on day #1 for gait training and transfer. OT started day #1 for ADL and assisted devices.  Patient's foley was d/c on day #1.  Patient's IV  was d/c on day #2.  On post op day #2 patient was stable and ready for discharge to home with HHPT.  Implants: Medacta AMIS standard 3, 54 mm mPACT TM cup and liner with 28 mm L metal head  He was given perioperative antibiotics:  Anti-infectives (From admission, onward)   Start     Dose/Rate Route Frequency Ordered Stop   04/26/19 2000  ceFAZolin (ANCEF) IVPB 2g/100 mL premix     2 g 200 mL/hr over 30 Minutes Intravenous Every 6 hours 04/26/19 1827 04/26/19 2107   04/26/19 1245  ceFAZolin (ANCEF) IVPB 2g/100 mL premix  Status:  Discontinued     2 g 200 mL/hr over 30 Minutes Intravenous Every 6 hours 04/26/19 1242 04/26/19 1827   04/26/19 0635  ceFAZolin (ANCEF) 2-4 GM/100ML-% IVPB    Note to Pharmacy: [facility]   : cabinet override      04/26/19 0635 04/26/19 0732   04/26/19 0000  ceFAZolin (ANCEF) IVPB 2g/100 mL premix     2 g 200 mL/hr over 30 Minutes Intravenous  Once 04/25/19 2358 04/26/19 0737    .  He was given sequential compression devices, early ambulation, and aspirin TEDs for DVT prophylaxis.  He benefited maximally from the hospital stay and there were no complications.    Recent vital signs:  Vitals:   04/27/19 1942 04/28/19 0821  BP: 131/64 114/62  Pulse: 86 88  Resp: 18 17  Temp: (!) 97.5 F (36.4 C) 98.7 F (37.1 C)  SpO2: 95% 95%    Recent laboratory studies:  Lab Results  Component Value Date   HGB  8.8 (L) 04/28/2019   HGB 10.1 (L) 04/27/2019   HGB 10.2 (L) 04/26/2019   Lab Results  Component Value Date   WBC 8.7 04/28/2019   PLT 444 (H) 04/28/2019   Lab Results  Component Value Date   INR 1.0 01/26/2019   Lab Results  Component Value Date   NA 133 (L) 04/28/2019   K 4.3 04/28/2019   CL 98 04/28/2019   CO2 29 04/28/2019   BUN 9 04/28/2019   CREATININE 0.71 04/28/2019   GLUCOSE 132 (H) 04/28/2019    Discharge Medications:   Allergies as of 04/28/2019   No Known Allergies     Medication List    STOP taking  these medications   acetaminophen 500 MG tablet Commonly known as: TYLENOL     TAKE these medications   aspirin 81 MG chewable tablet Chew 1 tablet (81 mg total) by mouth 2 (two) times daily for 30 days.   docusate sodium 100 MG capsule Commonly known as: COLACE Take 1 capsule (100 mg total) by mouth 2 (two) times daily.   doxazosin 4 MG tablet Commonly known as: CARDURA Take 1 tablet (4 mg total) by mouth daily.   ferrous [id number] C-folic acid capsule Commonly known as: TRINSICON / FOLTRIN Take 1 capsule by mouth 2 (two) times daily.   finasteride 5 MG tablet Commonly known as: PROSCAR Take 1 tablet (5 mg total) by mouth daily.   HYDROcodone-acetaminophen 5-325 MG tablet Commonly known as: NORCO/VICODIN Take 1-2 tablets by mouth every 4 (four) hours as needed for moderate pain (pain score 4-6).   multivitamin-iron-minerals-folic acid chewable tablet Chew 1 tablet by mouth daily.            Durable Medical Equipment  (From admission, onward)         Start     Ordered   04/26/19 1243  DME Walker rolling  Once    Question:  Patient needs a walker to treat with the following condition  Answer:  Status post total hip replacement, right   04/26/19 1242   04/26/19 1243  DME 3 n 1  Once     04/26/19 1242   04/26/19 1243  DME Bedside commode  Once    Question:  Patient needs a bedside commode to treat with the following condition  Answer:  Status post total hip replacement, right   04/26/19 1242          Diagnostic Studies: Dg Hip Operative Unilat W Or W/o Pelvis Right  Result Date: 04/26/2019 CLINICAL DATA:  Primary osteoarthritis of the right hip. Right total hip arthroplasty. EXAM: OPERATIVE RIGHT HIP (WITH PELVIS IF PERFORMED)  VIEWS TECHNIQUE: Fluoroscopic spot image(s) were submitted for interpretation post-operatively. COMPARISON:  None. FINDINGS: Ap C-arm images before and after hip arthroplasty demonstrate that the acetabular component appears in  good position. The visualized portion of the femoral component also appears in good position. IMPRESSION: Right total hip arthroplasty as described. Electronically Signed   By: Lorriane Shire M.D.   On: 04/26/2019 11:31   Dg Hip Unilat W Or W/o Pelvis 2-3 Views Right  Result Date: 04/26/2019 CLINICAL DATA:  Post RIGHT hip arthroplasty EXAM: DG HIP (WITH OR WITHOUT PELVIS) 2-3V RIGHT COMPARISON:  None FINDINGS: Components of a RIGHT hip prosthesis are identified. Overlying skin clips and postsurgical soft tissue changes. No fracture, dislocation, or bone destruction. IMPRESSION: RIGHT hip prosthesis without acute complication. Electronically Signed   By: Lavonia Dana M.D.   On: 04/26/2019 09:50  Disposition: Discharge disposition: 01-Home or Self Care         Follow-up Information    Duanne Guess, PA-C Follow up in 2 day(s).   Specialties: Orthopedic Surgery, Emergency Medicine Contact information: [place] [address] [phone]            Signed: Feliberto Gottron 04/28/2019, 8:52 AM

## 2019-04-27 NOTE — Progress Notes (Signed)
Physical Therapy Treatment Patient Details Name: [name] Foster MRN: [id number] DOB: Nov 04, 1941 Today's Date: 04/27/2019    History of Present Illness [name] Foster is a 78yo Male who comes to [facility] on 6/16 for THA Foster Dr. Rudene Christians. Pt reports Lt THA Foster Dr. Ollen Gross.    PT Comments    Pt progressing nicely in general. Pain remains well controlled and does not interfere from patient working toward mobility goals. Reviewed more exercises with patient. AMB progressed to nearly 25ft with good tolerance, double the velocity, and pt able to replicate cues for 2-point step-through gait. Pt performs 4 stairs with 1 rail, slow and steady, but needs cues for step sequencing when coming down.   Follow Up Recommendations  Home health PT;Follow surgeon's recommendation for DC plan and follow-up therapies     Equipment Recommendations  None recommended by PT    Recommendations for Other Services       Precautions / Restrictions Precautions Precautions: None Restrictions Weight Bearing Restrictions: Yes RLE Weight Bearing: Weight bearing as tolerated    Mobility  Bed Mobility Overal bed mobility: Needs Assistance Bed Mobility: Supine to Sit     Supine to sit: Supervision     General bed mobility comments: in chair upon entry  Transfers Overall transfer level: Needs assistance Equipment used: Rolling walker (2 wheeled) Transfers: Sit to/from Stand Sit to Stand: Supervision         General transfer comment: performed 7x in session (5x in sequence) good control, confidence, and technique. Safe RW use.  Ambulation/Gait Ambulation/Gait assistance: Min guard Gait Distance (Feet): 190 Feet Assistive device: Rolling walker (2 wheeled) Gait Pattern/deviations: WFL(Within Functional Limits) Gait velocity: 0.76m/s (0.73m/s 1DA)   General Gait Details: multimodal cues to stand tall and to attempt a 2-point step-through gait with RW, which he is able to.   Stairs Stairs:  Yes Stairs assistance: Modified independent (Device/Increase time) Stair Management: One rail Right;One rail Left;Step to pattern;Forwards Number of Stairs: 4 General stair comments: needs cues to come "down with the bad"   Wheelchair Mobility    Modified Rankin (Stroke Patients Only)       Balance Overall balance assessment: Modified Independent;No apparent balance deficits (not formally assessed) Sitting-balance support: No upper extremity supported;Feet supported Sitting balance-Leahy Scale: Good     Standing balance support: Bilateral upper extremity supported Standing balance-Leahy Scale: Fair                              Cognition Arousal/Alertness: Awake/alert Behavior During Therapy: WFL for tasks assessed/performed Overall Cognitive Status: Within Functional Limits for tasks assessed                                        Exercises Total Joint Exercises Ankle Circles/Pumps: AROM;Both;20 reps;Seated;Limitations Ankle Circles/Pumps Limitations: gravity resisted Long Arc Quad: AROM;Right;10 reps;Seated Other Exercises Other Exercises: pt instructed in falls prevention, compression stocking mgt, functional transfer training, and AE/DME for ADL tasks    General Comments        Pertinent Vitals/Pain Pain Assessment: 0-10 Pain Score: 4  Pain Location: Right hip Pain Descriptors / Indicators: Aching;Operative site guarding Pain Intervention(s): Limited activity within patient's tolerance;Monitored during session;Premedicated before session;RN gave pain meds during session    [place] expects to be discharged to:: Private residence Living Arrangements: Spouse/significant other Available Help at Discharge:  Family Type of Home: House Home Access: Stairs to enter Entrance Stairs-Rails: Right Home Layout: One level Home Equipment: Environmental consultant - 2 wheels;Cane - single point;Shower seat - built in;Shower seat;Bedside  commode;Adaptive equipment      Prior Function Level of Independence: Independent      Comments: SPC use for HH and limited community distances.   PT Goals (current goals can now be found in the care plan section) Acute Rehab PT Goals Patient Stated Goal: return to home, have a good outcome similar to prior THA PT Goal Formulation: With patient Time For Goal Achievement: 05/10/19 Potential to Achieve Goals: Good Progress towards PT goals: Progressing toward goals    Frequency    BID      PT Plan Current plan remains appropriate    Co-evaluation              AM-PAC PT "6 Clicks" Mobility   Outcome Measure  Help needed turning from your back to your side while in a flat bed without using bedrails?: A Little Help needed moving from lying on your back to sitting on the side of a flat bed without using bedrails?: A Little Help needed moving to and from a bed to a chair (including a wheelchair)?: A Little Help needed standing up from a chair using your arms (e.g., wheelchair or bedside chair)?: A Little Help needed to walk in hospital room?: A Little Help needed climbing 3-5 steps with a railing? : A Little 6 Click Score: 18    End of Session Equipment Utilized During Treatment: Gait belt Activity Tolerance: Patient tolerated treatment well;No increased pain Patient left: in chair;with call bell/phone within reach Nurse Communication: Mobility status PT Visit Diagnosis: Difficulty in walking, not elsewhere classified (R26.2);Other abnormalities of gait and mobility (R26.89)     Time: 3382-5053 PT Time Calculation (min) (ACUTE ONLY): 25 min  Charges:  $Gait Training: 8-22 mins $Therapeutic Exercise: 8-22 mins                     11:58 AM, 04/27/19 [name] Foster, PT, DPT Physical Therapist - [facility]  [phone] ([place])    [name] Foster 04/27/2019, 11:55 AM

## 2019-04-27 NOTE — Plan of Care (Signed)
progressing 

## 2019-04-27 NOTE — Evaluation (Signed)
Occupational Therapy Evaluation Patient Details Name: [name] Foster MRN: [id number] DOB: 12/30/1940 Today's Date: 04/27/2019    History of Present Illness [name] Foster is a 78yo Male who comes to [facility] on 6/16 for THA c Dr. Rudene Christians. Pt reports Lt THA c Dr. Ollen Gross.   Clinical Impression   Pt seen for OT evaluation this date, POD#1 from above surgery. Pt was independent in all ADL prior to surgery, and using SPC for mobility due to R hip pain. Pt is eager to return to PLOF with less pain and improved safety and independence. Pt currently requires minimal assist for LB dressing and bathing while in seated position due to pain and limited AROM of R hip. Pt instructed in self care skills, falls prevention strategies, home/routines modifications, DME/AE for LB bathing and dressing tasks, and compression stocking mgt strategies. Pt performed sup>sit EOB with supervision (use of bed rails, additional time), CGA for transfer after cues for hand/foot placement, and CGA for amb with RW from EOB to recliner. Pt reporting 3/10 pain throughout session. Pt verbalized understanding of instruction provided and would benefit from additional instruction in self care skills and techniques to help maintain precautions with or without assistive devices to support recall and carryover prior to discharge. Do not anticipate skilled OT needs at discharge.     Follow Up Recommendations  No OT follow up    Equipment Recommendations  None recommended by OT    Recommendations for Other Services       Precautions / Restrictions Precautions Precautions: None Restrictions Weight Bearing Restrictions: Yes RLE Weight Bearing: Weight bearing as tolerated      Mobility Bed Mobility Overal bed mobility: Needs Assistance Bed Mobility: Supine to Sit     Supine to sit: Supervision     General bed mobility comments: additional time/effort, use of bed rails  Transfers Overall transfer level: Needs  assistance Equipment used: Rolling walker (2 wheeled) Transfers: Sit to/from Stand Sit to Stand: Supervision;Min guard;From elevated surface         General transfer comment: elevated bed slightly to mimic home environment; pt instructed in hand/foot placement    Balance Overall balance assessment: Needs assistance Sitting-balance support: No upper extremity supported;Feet supported Sitting balance-Leahy Scale: Good     Standing balance support: Bilateral upper extremity supported Standing balance-Leahy Scale: Fair                             ADL either performed or assessed with clinical judgement   ADL Overall ADL's : Needs assistance/impaired                                       General ADL Comments: Min A for LB ADL tasks, spouse able to provide needed level of assist     Vision Baseline Vision/History: Wears glasses Wears Glasses: Reading only Patient Visual Report: No change from baseline       Perception     Praxis      Pertinent Vitals/Pain Pain Assessment: 0-10 Pain Score: 3  Pain Location: Right hip Pain Descriptors / Indicators: Aching;Operative site guarding Pain Intervention(s): Limited activity within patient's tolerance;Monitored during session;Premedicated before session;Repositioned     Hand Dominance Right   Extremity/Trunk Assessment Upper Extremity Assessment Upper Extremity Assessment: Overall WFL for tasks assessed   Lower Extremity Assessment Lower Extremity Assessment: RLE deficits/detail RLE  Deficits / Details: expected post-op strength/ROM deficits   Cervical / Trunk Assessment Cervical / Trunk Assessment: Normal   Communication Communication Communication: No difficulties   Cognition   Behavior During Therapy: WFL for tasks assessed/performed Overall Cognitive Status: Within Functional Limits for tasks assessed                                     General Comments        Exercises Other Exercises Other Exercises: pt instructed in falls prevention, compression stocking mgt, functional transfer training, and AE/DME for ADL tasks   Shoulder Instructions      Home Living Family/patient expects to be discharged to:: Private residence Living Arrangements: Spouse/significant other Available Help at Discharge: Family Type of Home: House Home Access: Stairs to enter Technical brewer of Steps: 3 Entrance Stairs-Rails: Right Home Layout: One level     Bathroom Shower/Tub: Occupational psychologist: Handicapped height     Home Equipment: Environmental consultant - 2 wheels;Cane - single point;Shower seat - built in;Shower seat;Bedside commode;Adaptive equipment Adaptive Equipment: Reacher        Prior Functioning/Environment Level of Independence: Independent        Comments: SPC use for HH and limited community distances.        OT Problem List: Decreased strength;Decreased range of motion;Pain      OT Treatment/Interventions: Self-care/ADL training;Therapeutic activities;Therapeutic exercise;DME and/or AE instruction;Patient/family education    OT Goals(Current goals can be found in the care plan section) Acute Rehab OT Goals Patient Stated Goal: return to home, have a good outcome similar to prior THA OT Goal Formulation: With patient Time For Goal Achievement: 05/11/19 Potential to Achieve Goals: Good ADL Goals Pt Will Perform Lower Body Dressing: with min guard assist;sit to/from stand;with adaptive equipment Pt Will Transfer to Toilet: with supervision;ambulating(BSC over toilet, LRAD for amb) Additional ADL Goal #1: Pt will independently instruct family in compression stocking mgt including positioning, wear schedule, and donning/doffing.  OT Frequency: Min 1X/week   Barriers to D/C:            Co-evaluation              AM-PAC OT "6 Clicks" Daily Activity     Outcome Measure Help from another person eating meals?: None Help  from another person taking care of personal grooming?: None Help from another person toileting, which includes using toliet, bedpan, or urinal?: A Little Help from another person bathing (including washing, rinsing, drying)?: A Little Help from another person to put on and taking off regular upper body clothing?: None Help from another person to put on and taking off regular lower body clothing?: A Little 6 Click Score: 21   End of Session Equipment Utilized During Treatment: Gait belt;Rolling walker  Activity Tolerance: Patient tolerated treatment well Patient left: in chair;with call bell/phone within reach  OT Visit Diagnosis: Other abnormalities of gait and mobility (R26.89);Pain Pain - Right/Left: Right Pain - part of body: Hip                Time: 1610-9604 OT Time Calculation (min): 16 min Charges:  OT General Charges $OT Visit: 1 Visit OT Evaluation $OT Eval Low Complexity: 1 Low OT Treatments $Self Care/Home Management : 8-22 mins  Jeni Salles, MPH, MS, OTR/L ascom [phone] 04/27/19, 9:32 AM

## 2019-04-28 LAB — CBC
HCT: 26.7 % — ABNORMAL LOW (ref 39.0–52.0)
Hemoglobin: 8.8 g/dL — ABNORMAL LOW (ref 13.0–17.0)
MCH: 28.5 pg (ref 26.0–34.0)
MCHC: 33 g/dL (ref 30.0–36.0)
MCV: 86.4 fL (ref 80.0–100.0)
Platelets: 444 10*3/uL — ABNORMAL HIGH (ref 150–400)
RBC: 3.09 MIL/uL — ABNORMAL LOW (ref 4.22–5.81)
RDW: 14.5 % (ref 11.5–15.5)
WBC: 8.7 10*3/uL (ref 4.0–10.5)
nRBC: 0 % (ref 0.0–0.2)

## 2019-04-28 LAB — BASIC METABOLIC PANEL
Anion gap: 6 (ref 5–15)
BUN: 9 mg/dL (ref 8–23)
CO2: 29 mmol/L (ref 22–32)
Calcium: 7.8 mg/dL — ABNORMAL LOW (ref 8.9–10.3)
Chloride: 98 mmol/L (ref 98–111)
Creatinine, Ser: 0.71 mg/dL (ref 0.61–1.24)
GFR calc Af Amer: >60 mL/min (ref >60)
GFR calc non Af Amer: >60 mL/min (ref >60)
Glucose, Bld: 132 mg/dL — ABNORMAL HIGH (ref 70–99)
Potassium: 4.3 mmol/L (ref 3.5–5.1)
Sodium: 133 mmol/L — ABNORMAL LOW (ref 135–145)

## 2019-04-28 LAB — SURGICAL PATHOLOGY

## 2019-04-28 MED ORDER — FE FUMARATE-B12-VIT C-FA-IFC PO CAPS: 1.0000 | ORAL_CAPSULE | Freq: Two times a day (BID) | ORAL | 0 refills | Status: DC

## 2019-04-28 MED ORDER — FE FUMARATE-B12-VIT C-FA-IFC PO CAPS
1.0000 | ORAL_CAPSULE | Freq: Two times a day (BID) | ORAL | Status: DC
  Filled 2019-04-28 (×2): qty 1

## 2019-04-28 NOTE — Progress Notes (Addendum)
   Subjective: 2 Days Post-Op Procedure(s) (LRB): TOTAL HIP ARTHROPLASTY ANTERIOR APPROACH (Right) Patient reports pain as mild.   Patient is well, and has had no acute complaints or problems Denies any CP, SOB, ABD pain. We will continue therapy today.  Plan is to go Home after hospital stay.  Objective: Vital signs in last 24 hours: Temp:  [97.5 F (36.4 C)-98.7 F (37.1 C)] 98.7 F (37.1 C) (06/18 0821) Pulse Rate:  [79-88] 88 (06/18 0821) Resp:  [17-18] 17 (06/18 0821) BP: (114-131)/(62-64) 114/62 (06/18 0821) SpO2:  [95 %-98 %] 95 % (06/18 0821) Weight:  [79.4 kg] 79.4 kg (06/17 0900)  Intake/Output from previous day: 06/17 0701 - 06/18 0700 In: 600 [P.O.:600] Out: 850 [Urine:850] Intake/Output this shift: Total I/O In: 769.5 [I.V.:769.5] Out: 200 [Urine:200]  Recent Labs    04/26/19 1257 04/27/19 0533 04/28/19 0436  HGB 10.2* 10.1* 8.8*   Recent Labs    04/27/19 0533 04/28/19 0436  WBC 9.9 8.7  RBC 3.55* 3.09*  HCT 30.9* 26.7*  PLT 518* 444*   Recent Labs    04/27/19 0533 04/28/19 0436  NA 133* 133*  K 4.0 4.3  CL 96* 98  CO2 26 29  BUN 13 9  CREATININE 0.86 0.71  GLUCOSE 146* 132*  CALCIUM 8.2* 7.8*   No results for input(s): LABPT, INR in the last 72 hours.  EXAM General - Patient is Alert, Appropriate and Oriented Extremity - Neurovascular intact Sensation intact distally Intact pulses distally Dorsiflexion/Plantar flexion intact No cellulitis present Compartment soft Dressing - dressing C/D/I and no drainage, Praveena intact without drainage Motor Function - intact, moving foot and toes well on exam.   Past Medical History:  Diagnosis Date  . Arthritis   . BPH (benign prostatic hyperplasia)   . Former smoker, stopped smoking many years ago   . History of needle biopsy of prostate with negative result 2013  . Hypertension   . Prostate enlargement   . Prostatic intraepithelial neoplasm III 2013    Assessment/Plan:   2 Days  Post-Op Procedure(s) (LRB): TOTAL HIP ARTHROPLASTY ANTERIOR APPROACH (Right) Active Problems:   Status post total hip replacement, right   Acute post op blood loss anemia   Estimated body mass index is 26.62 kg/m as calculated from the following:   Height as of this encounter: 5\' 8"  (1.727 m).   Weight as of this encounter: 79.4 kg. Advance diet Up with therapy  Acute post op blood loss anemia - Hgb 8.8 stable. Asymptomatic.Start Iron supplement. Vital signs stable Pain well controlled Discharge home with home health PT today  DVT Prophylaxis - Aspirin, Lovenox, TED hose and SCDs Weight-Bearing as tolerated to right leg   T. Rachelle Hora, PA-C [place] 04/28/2019, 8:48 AM

## 2019-04-28 NOTE — TOC Transition Note (Signed)
Transition of Care [facility]) - CM/SW Discharge Note   Patient Details  Name: [name] Foster MRN: [id number] Date of Birth: 20-Nov-1940  [facility] CM/SW Contact:  Arthelia Callicott, Lenice Llamas Phone Number: [phone]  04/28/2019, 9:18 AM   Clinical Narrative: Clinical Social Worker (CSW) notified Helene Kelp [facility] representative that patient will D/C home today. Patient has no equipment needs. Please reconsult if future social work needs arise. CSW signing off.     Final next level of care: [place] Barriers to Discharge: Barriers Resolved   Patient Goals and CMS Choice Patient states their goals for this hospitalization and ongoing recovery are:: Pain control CMS Medicare.gov Compare Post Acute Care list provided to:: Patient Choice offered to / list presented to : Patient  Discharge Placement                       Discharge Plan and Services In-house Referral: Clinical Social Work Discharge Planning Services: CM Consult Post Acute Care Choice: Home Health                    HH Arranged: PT [place]: [facility] (formerly [employer]) Date [place]: 04/28/19 Time [place]: [phone] Representative spoke with at [place]: [place] ([place]) Interventions     Readmission Risk Interventions No flowsheet data found.

## 2019-04-28 NOTE — Anesthesia Postprocedure Evaluation (Signed)
Anesthesia Post Note  Patient: [name] Foster  Procedure(s) Performed: TOTAL HIP ARTHROPLASTY ANTERIOR APPROACH (Right Hip)  Patient location during evaluation: PACU Anesthesia Type: General Level of consciousness: awake and alert Pain management: pain level controlled Vital Signs Assessment: post-procedure vital signs reviewed and stable Respiratory status: spontaneous breathing, nonlabored ventilation, respiratory function stable and patient connected to nasal cannula oxygen Cardiovascular status: blood pressure returned to baseline and stable Postop Assessment: no apparent nausea or vomiting Anesthetic complications: no     Last Vitals:  Vitals:   04/27/19 1942 04/28/19 0821  BP: 131/64 114/62  Pulse: 86 88  Resp: 18 17  Temp: (!) 36.4 C 37.1 C  SpO2: 95% 95%    Last Pain:  Vitals:   04/28/19 0854  TempSrc:   PainSc: 3                  Molli Barrows

## 2019-04-28 NOTE — Progress Notes (Signed)
Pt. Discharged to home  via family vehicle. Discharge instructions and medication regimen reviewed at bedside with patient. Pt. verbalizes understanding of instructions and medication regimen. Patient assessment unchanged from this morning. IV discontinued per policy.

## 2019-04-28 NOTE — Progress Notes (Signed)
Physical Therapy Treatment Patient Details Name: [name] Foster MRN: [id number] DOB: 1941/11/09 Today's Date: 04/28/2019    History of Present Illness [name] Foster is a 78yo Male who comes to [facility] on 6/16 for THA c Dr. Rudene Christians. Pt reports Lt THA c Dr. Ollen Gross.    PT Comments    Pt sitting EOB upon arrival.  Standing hip/knee flexion initially upon standing.  Stood and walked around unit x 1 with walker and supervision.  No LOB or buckling.  Pt stated he feels comfortable with discharge and has no further questions or concerns.     Follow Up Recommendations  Home health PT;Follow surgeon's recommendation for DC plan and follow-up therapies     Equipment Recommendations  Rolling walker with 5" wheels    Recommendations for Other Services       Precautions / Restrictions Precautions Precautions: None Restrictions Weight Bearing Restrictions: Yes RLE Weight Bearing: Weight bearing as tolerated    Mobility  Bed Mobility Overal bed mobility: Modified Independent                Transfers Overall transfer level: Modified independent Equipment used: Rolling walker (2 wheeled) Transfers: Sit to/from Stand Sit to Stand: Modified independent (Device/Increase time)            Ambulation/Gait Ambulation/Gait assistance: Supervision;Modified independent (Device/Increase time) Gait Distance (Feet): 275 Feet Assistive device: Rolling walker (2 wheeled) Gait Pattern/deviations: WFL(Within Functional Limits)         Stairs         General stair comments: stated he felt comfortable after training yesterday.  declined   Wheelchair Mobility    Modified Rankin (Stroke Patients Only)       Balance Overall balance assessment: Modified Independent   Sitting balance-Leahy Scale: Good     Standing balance support: Bilateral upper extremity supported Standing balance-Leahy Scale: Good                              Cognition  Arousal/Alertness: Awake/alert Behavior During Therapy: WFL for tasks assessed/performed Overall Cognitive Status: Within Functional Limits for tasks assessed                                        Exercises      General Comments        Pertinent Vitals/Pain Pain Assessment: 0-10 Pain Score: 3  Pain Location: Right hip Pain Descriptors / Indicators: Aching;Operative site guarding Pain Intervention(s): Monitored during session;Limited activity within patient's tolerance    Home Living                      Prior Function            PT Goals (current goals can now be found in the care plan section) Progress towards PT goals: Progressing toward goals    Frequency           PT Plan Current plan remains appropriate    Co-evaluation              AM-PAC PT "6 Clicks" Mobility   Outcome Measure  Help needed turning from your back to your side while in a flat bed without using bedrails?: None Help needed moving from lying on your back to sitting on the side of a flat bed without using bedrails?: None Help needed moving  to and from a bed to a chair (including a wheelchair)?: None Help needed standing up from a chair using your arms (e.g., wheelchair or bedside chair)?: None Help needed to walk in hospital room?: None Help needed climbing 3-5 steps with a railing? : A Little 6 Click Score: 23    End of Session Equipment Utilized During Treatment: Gait belt Activity Tolerance: Patient tolerated treatment well Patient left: in chair;with call bell/phone within reach         Time: 0908-0917 PT Time Calculation (min) (ACUTE ONLY): 9 min  Charges:  $Gait Training: 8-22 mins                     Chesley Noon, PTA 04/28/19, 9:25 AM

## 2019-04-30 DIAGNOSIS — Z8546 Personal history of malignant neoplasm of prostate: Secondary | ICD-10-CM | POA: Diagnosis not present

## 2019-04-30 DIAGNOSIS — Z96641 Presence of right artificial hip joint: Secondary | ICD-10-CM | POA: Diagnosis not present

## 2019-04-30 DIAGNOSIS — N400 Enlarged prostate without lower urinary tract symptoms: Secondary | ICD-10-CM | POA: Diagnosis not present

## 2019-04-30 DIAGNOSIS — Z87891 Personal history of nicotine dependence: Secondary | ICD-10-CM | POA: Diagnosis not present

## 2019-04-30 DIAGNOSIS — Z471 Aftercare following joint replacement surgery: Secondary | ICD-10-CM | POA: Diagnosis not present

## 2019-04-30 DIAGNOSIS — I10 Essential (primary) hypertension: Secondary | ICD-10-CM | POA: Diagnosis not present

## 2019-05-03 DIAGNOSIS — I10 Essential (primary) hypertension: Secondary | ICD-10-CM | POA: Diagnosis not present

## 2019-05-03 DIAGNOSIS — Z96641 Presence of right artificial hip joint: Secondary | ICD-10-CM | POA: Diagnosis not present

## 2019-05-03 DIAGNOSIS — Z8546 Personal history of malignant neoplasm of prostate: Secondary | ICD-10-CM | POA: Diagnosis not present

## 2019-05-03 DIAGNOSIS — Z471 Aftercare following joint replacement surgery: Secondary | ICD-10-CM | POA: Diagnosis not present

## 2019-05-03 DIAGNOSIS — N400 Enlarged prostate without lower urinary tract symptoms: Secondary | ICD-10-CM | POA: Diagnosis not present

## 2019-05-03 DIAGNOSIS — Z87891 Personal history of nicotine dependence: Secondary | ICD-10-CM | POA: Diagnosis not present

## 2019-05-05 DIAGNOSIS — Z471 Aftercare following joint replacement surgery: Secondary | ICD-10-CM | POA: Diagnosis not present

## 2019-05-05 DIAGNOSIS — I10 Essential (primary) hypertension: Secondary | ICD-10-CM | POA: Diagnosis not present

## 2019-05-05 DIAGNOSIS — Z96641 Presence of right artificial hip joint: Secondary | ICD-10-CM | POA: Diagnosis not present

## 2019-05-05 DIAGNOSIS — N400 Enlarged prostate without lower urinary tract symptoms: Secondary | ICD-10-CM | POA: Diagnosis not present

## 2019-05-05 DIAGNOSIS — Z87891 Personal history of nicotine dependence: Secondary | ICD-10-CM | POA: Diagnosis not present

## 2019-05-05 DIAGNOSIS — Z8546 Personal history of malignant neoplasm of prostate: Secondary | ICD-10-CM | POA: Diagnosis not present

## 2019-05-09 DIAGNOSIS — I10 Essential (primary) hypertension: Secondary | ICD-10-CM | POA: Diagnosis not present

## 2019-05-09 DIAGNOSIS — Z471 Aftercare following joint replacement surgery: Secondary | ICD-10-CM | POA: Diagnosis not present

## 2019-05-09 DIAGNOSIS — Z87891 Personal history of nicotine dependence: Secondary | ICD-10-CM | POA: Diagnosis not present

## 2019-05-09 DIAGNOSIS — Z96641 Presence of right artificial hip joint: Secondary | ICD-10-CM | POA: Diagnosis not present

## 2019-05-09 DIAGNOSIS — N400 Enlarged prostate without lower urinary tract symptoms: Secondary | ICD-10-CM | POA: Diagnosis not present

## 2019-05-09 DIAGNOSIS — Z8546 Personal history of malignant neoplasm of prostate: Secondary | ICD-10-CM | POA: Diagnosis not present

## 2019-05-11 DIAGNOSIS — M160 Bilateral primary osteoarthritis of hip: Secondary | ICD-10-CM | POA: Diagnosis not present

## 2019-05-11 DIAGNOSIS — M170 Bilateral primary osteoarthritis of knee: Secondary | ICD-10-CM | POA: Diagnosis not present

## 2019-05-11 DIAGNOSIS — M059 Rheumatoid arthritis with rheumatoid factor, unspecified: Secondary | ICD-10-CM | POA: Diagnosis not present

## 2019-05-11 DIAGNOSIS — Z96641 Presence of right artificial hip joint: Secondary | ICD-10-CM | POA: Diagnosis not present

## 2019-05-11 DIAGNOSIS — Z79899 Other long term (current) drug therapy: Secondary | ICD-10-CM | POA: Diagnosis not present

## 2019-05-12 DIAGNOSIS — N400 Enlarged prostate without lower urinary tract symptoms: Secondary | ICD-10-CM | POA: Diagnosis not present

## 2019-05-12 DIAGNOSIS — Z471 Aftercare following joint replacement surgery: Secondary | ICD-10-CM | POA: Diagnosis not present

## 2019-05-12 DIAGNOSIS — Z8546 Personal history of malignant neoplasm of prostate: Secondary | ICD-10-CM | POA: Diagnosis not present

## 2019-05-12 DIAGNOSIS — Z87891 Personal history of nicotine dependence: Secondary | ICD-10-CM | POA: Diagnosis not present

## 2019-05-12 DIAGNOSIS — Z96641 Presence of right artificial hip joint: Secondary | ICD-10-CM | POA: Diagnosis not present

## 2019-05-12 DIAGNOSIS — I10 Essential (primary) hypertension: Secondary | ICD-10-CM | POA: Diagnosis not present

## 2019-05-16 ENCOUNTER — Emergency Department
Admission: EM | Admit: 2019-05-16 | Discharge: 2019-05-16 | Disposition: A | Discharge status: Home / Self Care | Payer: Medicare HMO | Attending: Emergency Medicine | Admitting: Emergency Medicine

## 2019-05-16 ENCOUNTER — Emergency Department: Payer: Medicare HMO

## 2019-05-16 ENCOUNTER — Other Ambulatory Visit
Admission: RE | Admit: 2019-05-16 | Discharge: 2019-05-16 | Disposition: A | Discharge status: Home / Self Care | Payer: Medicare HMO | Source: Ambulatory Visit | Attending: Internal Medicine | Admitting: Internal Medicine

## 2019-05-16 ENCOUNTER — Other Ambulatory Visit: Payer: Self-pay

## 2019-05-16 DIAGNOSIS — D649 Anemia, unspecified: Secondary | ICD-10-CM

## 2019-05-16 DIAGNOSIS — Z03818 Encounter for observation for suspected exposure to other biological agents ruled out: Secondary | ICD-10-CM | POA: Diagnosis not present

## 2019-05-16 DIAGNOSIS — R0600 Dyspnea, unspecified: Secondary | ICD-10-CM

## 2019-05-16 DIAGNOSIS — Z20828 Contact with and (suspected) exposure to other viral communicable diseases: Secondary | ICD-10-CM | POA: Diagnosis not present

## 2019-05-16 DIAGNOSIS — Z87891 Personal history of nicotine dependence: Secondary | ICD-10-CM | POA: Insufficient documentation

## 2019-05-16 DIAGNOSIS — I10 Essential (primary) hypertension: Secondary | ICD-10-CM | POA: Diagnosis not present

## 2019-05-16 DIAGNOSIS — J209 Acute bronchitis, unspecified: Secondary | ICD-10-CM | POA: Diagnosis not present

## 2019-05-16 DIAGNOSIS — J189 Pneumonia, unspecified organism: Secondary | ICD-10-CM | POA: Insufficient documentation

## 2019-05-16 DIAGNOSIS — J019 Acute sinusitis, unspecified: Secondary | ICD-10-CM | POA: Diagnosis not present

## 2019-05-16 DIAGNOSIS — R05 Cough: Secondary | ICD-10-CM | POA: Diagnosis not present

## 2019-05-16 DIAGNOSIS — R7989 Other specified abnormal findings of blood chemistry: Secondary | ICD-10-CM | POA: Diagnosis not present

## 2019-05-16 DIAGNOSIS — R509 Fever, unspecified: Secondary | ICD-10-CM | POA: Insufficient documentation

## 2019-05-16 DIAGNOSIS — R531 Weakness: Secondary | ICD-10-CM | POA: Diagnosis not present

## 2019-05-16 DIAGNOSIS — B9689 Other specified bacterial agents as the cause of diseases classified elsewhere: Secondary | ICD-10-CM | POA: Diagnosis not present

## 2019-05-16 DIAGNOSIS — R0602 Shortness of breath: Secondary | ICD-10-CM | POA: Diagnosis not present

## 2019-05-16 LAB — FIBRIN DERIVATIVES D-DIMER (ARMC ONLY): Fibrin derivatives D-dimer (ARMC): 5522.47 ng/mL (FEU) — ABNORMAL HIGH (ref 0.00–499.00)

## 2019-05-16 LAB — BASIC METABOLIC PANEL
Anion gap: 11 (ref 5–15)
BUN: 10 mg/dL (ref 8–23)
CO2: 25 mmol/L (ref 22–32)
Calcium: 8.1 mg/dL — ABNORMAL LOW (ref 8.9–10.3)
Chloride: 96 mmol/L — ABNORMAL LOW (ref 98–111)
Creatinine, Ser: 0.63 mg/dL (ref 0.61–1.24)
GFR calc Af Amer: >60 mL/min (ref >60)
GFR calc non Af Amer: >60 mL/min (ref >60)
Glucose, Bld: 143 mg/dL — ABNORMAL HIGH (ref 70–99)
Potassium: 4.3 mmol/L (ref 3.5–5.1)
Sodium: 132 mmol/L — ABNORMAL LOW (ref 135–145)

## 2019-05-16 LAB — CBC
HCT: 26.9 % — ABNORMAL LOW (ref 39.0–52.0)
Hemoglobin: 8.6 g/dL — ABNORMAL LOW (ref 13.0–17.0)
MCH: 27.1 pg (ref 26.0–34.0)
MCHC: 32 g/dL (ref 30.0–36.0)
MCV: 84.9 fL (ref 80.0–100.0)
Platelets: 628 10*3/uL — ABNORMAL HIGH (ref 150–400)
RBC: 3.17 MIL/uL — ABNORMAL LOW (ref 4.22–5.81)
RDW: 14.9 % (ref 11.5–15.5)
WBC: 11.8 10*3/uL — ABNORMAL HIGH (ref 4.0–10.5)
nRBC: 0 % (ref 0.0–0.2)

## 2019-05-16 MED ORDER — IOHEXOL 350 MG/ML SOLN
75.0000 mL | Freq: Once | INTRAVENOUS | Status: AC | PRN
  Administered 2019-05-16: 75 mL via INTRAVENOUS

## 2019-05-16 NOTE — ED Notes (Signed)
Pt reports he had hip replacement and has not been well since after surgery in mid June.  Pt reports feeling weak and has a cough with intermittent sob.  Non smoker.  No fever.  Pt alert  Speech clear.  Denies any pain.

## 2019-05-16 NOTE — ED Triage Notes (Signed)
C/o weakness, SOB and cough X 2 weeks. Pt had labs drawn at [facility] which resulting in elevated D-dimer. Pt denies any pain in legs. Pt had right hip replacement 04/26/19 and voices no complications.

## 2019-05-16 NOTE — ED Provider Notes (Signed)
[facility] Emergency Department Provider Note       Time seen: ----------------------------------------- 5:45 PM on 05/16/2019 -----------------------------------------   I have reviewed the triage vital signs and the nursing notes.  HISTORY   Chief Complaint Cough, Weakness, Shortness of Breath, and Abnormal Lab    HPI [name] Foster is a 78 y.o. male with a history of arthritis, BPH, hypertension, prostate cancer who presents to the ED for weakness, shortness of breath and cough for the past 2 weeks.  Patient had labs drawn at [facility] resulting in an elevated d-dimer.  He was sent here for further evaluation.  Patient had right hip arthroplasty 3 weeks ago.  Past Medical History:  Diagnosis Date  . Arthritis   . BPH (benign prostatic hyperplasia)   . Former smoker, stopped smoking many years ago   . History of needle biopsy of prostate with negative result 2013  . Hypertension   . Prostate enlargement   . Prostatic intraepithelial neoplasm III 2013    Patient Active Problem List   Diagnosis Date Noted  . 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 Neutropenia ([place]) 06/04/2017  . Incomplete emptying of bladder 02/03/2017  . 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 Abnormal finding on chest xray 07/18/2013  . Chronic cough 07/18/2013  . Tests ordered 09/03/2012  . 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 Benign localized prostatic hyperplasia with lower urinary tract symptoms (LUTS) 07/07/2012  . Elevated prostate specific antigen (PSA) 07/07/2012    Past Surgical History:  Procedure Laterality Date  . HEMORROIDECTOMY    . HERNIA REPAIR    . JOINT REPLACEMENT Left    THR  . PROSTATE BIOPSY  2013  . TOTAL HIP ARTHROPLASTY Left   . TOTAL HIP ARTHROPLASTY Right 04/26/2019   Procedure: TOTAL HIP ARTHROPLASTY ANTERIOR APPROACH;  Surgeon: Hessie Knows, MD;  Location: [facility] ORS;   Service: Orthopedics;  Laterality: Right;    Allergies Patient has no known allergies.  Social History Social History   Tobacco Use  . Smoking status: Former Smoker    Quit date: 06/04/1980    Years since quitting: 38.9  . Smokeless tobacco: Never Used  Substance Use Topics  . Alcohol use: No  . Drug use: No    Review of Systems Constitutional: Negative for fever. Cardiovascular: Negative for chest pain. Respiratory: Positive for shortness of breath and cough Gastrointestinal: Negative for abdominal pain, vomiting and diarrhea. Musculoskeletal: Negative for back pain. Skin: Negative for rash. Neurological: Negative for headaches, focal weakness or numbness.  All systems negative/normal/unremarkable except as stated in the HPI  ____________________________________________   PHYSICAL EXAM:  VITAL SIGNS: ED Triage Vitals  Enc Vitals Group     BP 05/16/19 1500 (!) 114/55     Pulse Rate 05/16/19 1500 (!) 103     Resp 05/16/19 1500 18     Temp 05/16/19 1500 99.1 F (37.3 C)     Temp Source 05/16/19 1500 Oral     SpO2 05/16/19 1500 96 %     Weight 05/16/19 1501 176 lb (79.8 kg)     Height 05/16/19 1501 5\' 8"  (1.727 m)     Head Circumference --      Peak Flow --      Pain Score 05/16/19 1500 0     Pain Loc --      Pain Edu? --      Excl. in [place]? --    Constitutional: Alert  and oriented. Well appearing and in no distress. Eyes: Conjunctivae are normal. Normal extraocular movements. Cardiovascular: Normal rate, regular rhythm. No murmurs, rubs, or gallops. Respiratory: Normal respiratory effort without tachypnea nor retractions. Breath sounds are clear and equal bilaterally. No wheezes/rales/rhonchi. Gastrointestinal: Soft and nontender. Normal bowel sounds Musculoskeletal: Nontender with normal range of motion in extremities. No lower extremity tenderness nor edema. Neurologic:  Normal speech and language. No gross focal neurologic deficits are appreciated.  Skin:   Skin is warm, dry and intact. No rash noted. Psychiatric: Mood and affect are normal. Speech and behavior are normal.  ____________________________________________  EKG: Interpreted by me.  Sinus rhythm the rate of 100 bpm, normal PR interval, normal QRS, normal QT  ____________________________________________  ED COURSE:  As part of my medical decision making, I reviewed the following data within the [place] History obtained from family if available, nursing notes, old chart and ekg, as well as notes from prior ED visits. Patient presented for abnormal d-dimer elevation in the setting of cough and weakness, we will assess with labs and imaging as indicated at this time.   Procedures  [name] Foster was evaluated in Emergency Department on 05/16/2019 for the symptoms described in the history of present illness. He was evaluated in the context of the global COVID-19 pandemic, which necessitated consideration that the patient might be at risk for infection with the SARS-CoV-2 virus that causes COVID-19. Institutional protocols and algorithms that pertain to the evaluation of patients at risk for COVID-19 are in a state of rapid change based on information released by regulatory bodies including the CDC and federal and state organizations. These policies and algorithms were followed during the patient's care in the ED.  ____________________________________________   LABS (pertinent positives/negatives)  Labs Reviewed  BASIC METABOLIC PANEL - Abnormal; Notable for the following components:      Result Value   Sodium 132 (*)    Chloride 96 (*)    Glucose, Bld 143 (*)    Calcium 8.1 (*)    All other components within normal limits  CBC - Abnormal; Notable for the following components:   WBC 11.8 (*)    RBC 3.17 (*)    Hemoglobin 8.6 (*)    HCT 26.9 (*)    Platelets 628 (*)    All other components within normal limits  URINALYSIS, COMPLETE (UACMP) WITH MICROSCOPIC     RADIOLOGY Images were viewed by me  CT angiogram of the chest IMPRESSION: 1. Examination is mildly limited by motion artifact and marginal contrast bolus for the evaluation of pulmonary embolism, main pulmonary artery = 195 HU. Within this limitation, no evidence of pulmonary embolism through the segmental pulmonary arterial level.  2. There is diffuse bilateral bronchial wall thickening, most conspicuous in the right lower lobe, with extensive, predominantly right-sided subpleural ground-glass and irregular pulmonary opacity nodularity. Scarring and/or atelectasis of the dependent bilateral lung bases. Generally suspect atypical infection and/or chronic sequelae, such as aspiration, however mild pulmonary fibrosis is not excluded. Consider follow-up ILD protocol CT in 6-12 months if clinically appropriate.  ____________________________________________   DIFFERENTIAL DIAGNOSIS   PE, dehydration, anemia, electrolyte abnormality, viral illness  FINAL ASSESSMENT AND PLAN  Weakness, shortness of breath   Plan: The patient had presented for weakness and shortness of breath. Patient's labs did reveal anemia with a hemoglobin of 8.6 which could be contributing to his shortness of breath and weakness. Patient's imaging revealed bilateral bronchial wall thickening and groundglass opacities of uncertain  etiology.  He was placed on Zithromax today as well as cough medicine and decongestants.  I am concerned that his anemia has not significantly improved and this will need to be rechecked.  Otherwise he is cleared for outpatient follow-up with his doctor for reevaluation.   Laurence Aly, MD    Note: This note was generated in part or whole with voice recognition software. Voice recognition is usually quite accurate but there are transcription errors that can and very often do occur. I apologize for any typographical errors that were not detected and corrected.     Earleen Newport, MD 05/16/19 [phone]

## 2019-05-16 NOTE — ED Notes (Signed)
Patient transported to CT 

## 2019-05-17 DIAGNOSIS — Z471 Aftercare following joint replacement surgery: Secondary | ICD-10-CM | POA: Diagnosis not present

## 2019-05-17 DIAGNOSIS — Z8546 Personal history of malignant neoplasm of prostate: Secondary | ICD-10-CM | POA: Diagnosis not present

## 2019-05-17 DIAGNOSIS — N400 Enlarged prostate without lower urinary tract symptoms: Secondary | ICD-10-CM | POA: Diagnosis not present

## 2019-05-17 DIAGNOSIS — I10 Essential (primary) hypertension: Secondary | ICD-10-CM | POA: Diagnosis not present

## 2019-05-17 DIAGNOSIS — Z87891 Personal history of nicotine dependence: Secondary | ICD-10-CM | POA: Diagnosis not present

## 2019-05-17 DIAGNOSIS — Z96641 Presence of right artificial hip joint: Secondary | ICD-10-CM | POA: Diagnosis not present

## 2019-05-23 ENCOUNTER — Other Ambulatory Visit
Admission: RE | Admit: 2019-05-23 | Discharge: 2019-05-23 | Disposition: A | Discharge status: Home / Self Care | Payer: Medicare HMO | Source: Ambulatory Visit | Attending: Pediatrics | Admitting: Pediatrics

## 2019-05-23 DIAGNOSIS — R5383 Other fatigue: Secondary | ICD-10-CM | POA: Diagnosis not present

## 2019-05-23 DIAGNOSIS — Z125 Encounter for screening for malignant neoplasm of prostate: Secondary | ICD-10-CM | POA: Diagnosis not present

## 2019-05-23 DIAGNOSIS — D649 Anemia, unspecified: Secondary | ICD-10-CM | POA: Diagnosis not present

## 2019-05-23 DIAGNOSIS — R05 Cough: Secondary | ICD-10-CM | POA: Diagnosis not present

## 2019-05-23 DIAGNOSIS — R634 Abnormal weight loss: Secondary | ICD-10-CM | POA: Diagnosis not present

## 2019-05-23 DIAGNOSIS — J22 Unspecified acute lower respiratory infection: Secondary | ICD-10-CM | POA: Diagnosis not present

## 2019-05-23 DIAGNOSIS — R7309 Other abnormal glucose: Secondary | ICD-10-CM | POA: Diagnosis not present

## 2019-05-23 DIAGNOSIS — R0602 Shortness of breath: Secondary | ICD-10-CM | POA: Diagnosis not present

## 2019-05-23 LAB — FIBRIN DERIVATIVES D-DIMER (ARMC ONLY): Fibrin derivatives D-dimer (ARMC): 4773.2 ng/mL (FEU) — ABNORMAL HIGH (ref 0.00–499.00)

## 2019-05-24 ENCOUNTER — Encounter: Payer: Self-pay | Admitting: *Deleted

## 2019-05-24 ENCOUNTER — Other Ambulatory Visit: Payer: Self-pay

## 2019-05-24 DIAGNOSIS — J208 Acute bronchitis due to other specified organisms: Secondary | ICD-10-CM | POA: Diagnosis present

## 2019-05-24 DIAGNOSIS — Z7982 Long term (current) use of aspirin: Secondary | ICD-10-CM

## 2019-05-24 DIAGNOSIS — Z8042 Family history of malignant neoplasm of prostate: Secondary | ICD-10-CM | POA: Diagnosis not present

## 2019-05-24 DIAGNOSIS — D509 Iron deficiency anemia, unspecified: Secondary | ICD-10-CM | POA: Diagnosis present

## 2019-05-24 DIAGNOSIS — J849 Interstitial pulmonary disease, unspecified: Secondary | ICD-10-CM | POA: Diagnosis present

## 2019-05-24 DIAGNOSIS — R000 Tachycardia, unspecified: Secondary | ICD-10-CM | POA: Diagnosis not present

## 2019-05-24 DIAGNOSIS — R0602 Shortness of breath: Secondary | ICD-10-CM | POA: Diagnosis not present

## 2019-05-24 DIAGNOSIS — A4189 Other specified sepsis: Secondary | ICD-10-CM | POA: Diagnosis not present

## 2019-05-24 DIAGNOSIS — Z66 Do not resuscitate: Secondary | ICD-10-CM | POA: Diagnosis present

## 2019-05-24 DIAGNOSIS — E871 Hypo-osmolality and hyponatremia: Secondary | ICD-10-CM | POA: Diagnosis not present

## 2019-05-24 DIAGNOSIS — R634 Abnormal weight loss: Secondary | ICD-10-CM | POA: Diagnosis not present

## 2019-05-24 DIAGNOSIS — Z20828 Contact with and (suspected) exposure to other viral communicable diseases: Secondary | ICD-10-CM | POA: Diagnosis not present

## 2019-05-24 DIAGNOSIS — D708 Other neutropenia: Secondary | ICD-10-CM | POA: Diagnosis not present

## 2019-05-24 DIAGNOSIS — E860 Dehydration: Secondary | ICD-10-CM | POA: Diagnosis present

## 2019-05-24 DIAGNOSIS — I10 Essential (primary) hypertension: Secondary | ICD-10-CM | POA: Diagnosis present

## 2019-05-24 DIAGNOSIS — C61 Malignant neoplasm of prostate: Secondary | ICD-10-CM | POA: Diagnosis present

## 2019-05-24 DIAGNOSIS — M0579 Rheumatoid arthritis with rheumatoid factor of multiple sites without organ or systems involvement: Secondary | ICD-10-CM | POA: Diagnosis not present

## 2019-05-24 DIAGNOSIS — G729 Myopathy, unspecified: Secondary | ICD-10-CM | POA: Diagnosis not present

## 2019-05-24 DIAGNOSIS — Z96643 Presence of artificial hip joint, bilateral: Secondary | ICD-10-CM | POA: Diagnosis present

## 2019-05-24 DIAGNOSIS — N200 Calculus of kidney: Secondary | ICD-10-CM | POA: Diagnosis present

## 2019-05-24 DIAGNOSIS — R509 Fever, unspecified: Secondary | ICD-10-CM | POA: Diagnosis not present

## 2019-05-24 DIAGNOSIS — Z87891 Personal history of nicotine dependence: Secondary | ICD-10-CM

## 2019-05-24 DIAGNOSIS — J189 Pneumonia, unspecified organism: Secondary | ICD-10-CM | POA: Diagnosis not present

## 2019-05-24 DIAGNOSIS — M069 Rheumatoid arthritis, unspecified: Secondary | ICD-10-CM | POA: Diagnosis present

## 2019-05-24 DIAGNOSIS — D3502 Benign neoplasm of left adrenal gland: Secondary | ICD-10-CM | POA: Diagnosis present

## 2019-05-24 DIAGNOSIS — Z1159 Encounter for screening for other viral diseases: Secondary | ICD-10-CM | POA: Diagnosis not present

## 2019-05-24 DIAGNOSIS — R05 Cough: Secondary | ICD-10-CM | POA: Diagnosis not present

## 2019-05-24 DIAGNOSIS — Z515 Encounter for palliative care: Secondary | ICD-10-CM | POA: Diagnosis not present

## 2019-05-24 DIAGNOSIS — Z8546 Personal history of malignant neoplasm of prostate: Secondary | ICD-10-CM | POA: Diagnosis not present

## 2019-05-24 DIAGNOSIS — R531 Weakness: Secondary | ICD-10-CM | POA: Diagnosis not present

## 2019-05-24 DIAGNOSIS — D649 Anemia, unspecified: Secondary | ICD-10-CM | POA: Diagnosis not present

## 2019-05-24 DIAGNOSIS — R0600 Dyspnea, unspecified: Secondary | ICD-10-CM | POA: Diagnosis not present

## 2019-05-24 DIAGNOSIS — M6281 Muscle weakness (generalized): Secondary | ICD-10-CM | POA: Diagnosis present

## 2019-05-24 DIAGNOSIS — A419 Sepsis, unspecified organism: Secondary | ICD-10-CM | POA: Diagnosis not present

## 2019-05-24 DIAGNOSIS — Z79899 Other long term (current) drug therapy: Secondary | ICD-10-CM | POA: Diagnosis not present

## 2019-05-24 DIAGNOSIS — Z7189 Other specified counseling: Secondary | ICD-10-CM | POA: Diagnosis not present

## 2019-05-24 LAB — LACTIC ACID, PLASMA: Lactic Acid, Venous: 1 mmol/L (ref 0.5–1.9)

## 2019-05-24 LAB — COMPREHENSIVE METABOLIC PANEL
ALT: 27 U/L (ref 0–44)
AST: 26 U/L (ref 15–41)
Albumin: 2.1 g/dL — ABNORMAL LOW (ref 3.5–5.0)
Alkaline Phosphatase: 258 U/L — ABNORMAL HIGH (ref 38–126)
Anion gap: 8 (ref 5–15)
BUN: 16 mg/dL (ref 8–23)
CO2: 28 mmol/L (ref 22–32)
Calcium: 8 mg/dL — ABNORMAL LOW (ref 8.9–10.3)
Chloride: 94 mmol/L — ABNORMAL LOW (ref 98–111)
Creatinine, Ser: 0.79 mg/dL (ref 0.61–1.24)
GFR calc Af Amer: >60 mL/min (ref >60)
GFR calc non Af Amer: >60 mL/min (ref >60)
Glucose, Bld: 173 mg/dL — ABNORMAL HIGH (ref 70–99)
Potassium: 4.1 mmol/L (ref 3.5–5.1)
Sodium: 130 mmol/L — ABNORMAL LOW (ref 135–145)
Total Bilirubin: 0.8 mg/dL (ref 0.3–1.2)
Total Protein: 6.7 g/dL (ref 6.5–8.1)

## 2019-05-24 LAB — CBC WITH DIFFERENTIAL/PLATELET
Abs Immature Granulocytes: 0.06 10*3/uL (ref 0.00–0.07)
Basophils Absolute: 0 10*3/uL (ref 0.0–0.1)
Basophils Relative: 0 %
Eosinophils Absolute: 0.1 10*3/uL (ref 0.0–0.5)
Eosinophils Relative: 1 %
HCT: 28.3 % — ABNORMAL LOW (ref 39.0–52.0)
Hemoglobin: 9 g/dL — ABNORMAL LOW (ref 13.0–17.0)
Immature Granulocytes: 1 %
Lymphocytes Relative: 19 %
Lymphs Abs: 2.1 10*3/uL (ref 0.7–4.0)
MCH: 26.4 pg (ref 26.0–34.0)
MCHC: 31.8 g/dL (ref 30.0–36.0)
MCV: 83 fL (ref 80.0–100.0)
Monocytes Absolute: 0.9 10*3/uL (ref 0.1–1.0)
Monocytes Relative: 8 %
Neutro Abs: 7.9 10*3/uL — ABNORMAL HIGH (ref 1.7–7.7)
Neutrophils Relative %: 71 %
Platelets: 575 10*3/uL — ABNORMAL HIGH (ref 150–400)
RBC: 3.41 MIL/uL — ABNORMAL LOW (ref 4.22–5.81)
RDW: 15.3 % (ref 11.5–15.5)
WBC: 11 10*3/uL — ABNORMAL HIGH (ref 4.0–10.5)
nRBC: 0 % (ref 0.0–0.2)

## 2019-05-24 NOTE — ED Triage Notes (Addendum)
Pt to ED via EMS from home with weakness x multiple weeks. Pt has had a dry cough and dx for pneumonia within the last week. No pain reported. Pt placed on methotrexate and family reporting the weakness has worsened since starting that medication. Pt also reporting a 20lb weight loss since starting medication.  101.5 temp with EMS

## 2019-05-25 ENCOUNTER — Other Ambulatory Visit: Payer: Self-pay

## 2019-05-25 ENCOUNTER — Emergency Department: Payer: Medicare HMO

## 2019-05-25 ENCOUNTER — Inpatient Hospital Stay
Admission: EM | Admit: 2019-05-25 | Discharge: 2019-05-27 | DRG: 872 | Disposition: A | Discharge status: Home Health | Payer: Medicare HMO | Attending: Internal Medicine | Admitting: Internal Medicine

## 2019-05-25 DIAGNOSIS — D509 Iron deficiency anemia, unspecified: Secondary | ICD-10-CM | POA: Diagnosis present

## 2019-05-25 DIAGNOSIS — J849 Interstitial pulmonary disease, unspecified: Secondary | ICD-10-CM | POA: Diagnosis present

## 2019-05-25 DIAGNOSIS — J189 Pneumonia, unspecified organism: Secondary | ICD-10-CM

## 2019-05-25 DIAGNOSIS — Z1159 Encounter for screening for other viral diseases: Secondary | ICD-10-CM | POA: Diagnosis not present

## 2019-05-25 DIAGNOSIS — Z79899 Other long term (current) drug therapy: Secondary | ICD-10-CM | POA: Diagnosis not present

## 2019-05-25 DIAGNOSIS — Z7189 Other specified counseling: Secondary | ICD-10-CM

## 2019-05-25 DIAGNOSIS — E860 Dehydration: Secondary | ICD-10-CM | POA: Diagnosis present

## 2019-05-25 DIAGNOSIS — I10 Essential (primary) hypertension: Secondary | ICD-10-CM | POA: Diagnosis present

## 2019-05-25 DIAGNOSIS — C61 Malignant neoplasm of prostate: Secondary | ICD-10-CM | POA: Diagnosis present

## 2019-05-25 DIAGNOSIS — Z87891 Personal history of nicotine dependence: Secondary | ICD-10-CM | POA: Diagnosis not present

## 2019-05-25 DIAGNOSIS — D3502 Benign neoplasm of left adrenal gland: Secondary | ICD-10-CM | POA: Diagnosis present

## 2019-05-25 DIAGNOSIS — Z66 Do not resuscitate: Secondary | ICD-10-CM | POA: Diagnosis present

## 2019-05-25 DIAGNOSIS — A4189 Other specified sepsis: Secondary | ICD-10-CM | POA: Diagnosis present

## 2019-05-25 DIAGNOSIS — Z8546 Personal history of malignant neoplasm of prostate: Secondary | ICD-10-CM | POA: Diagnosis not present

## 2019-05-25 DIAGNOSIS — A419 Sepsis, unspecified organism: Secondary | ICD-10-CM

## 2019-05-25 DIAGNOSIS — J208 Acute bronchitis due to other specified organisms: Secondary | ICD-10-CM | POA: Diagnosis present

## 2019-05-25 DIAGNOSIS — D708 Other neutropenia: Secondary | ICD-10-CM | POA: Diagnosis not present

## 2019-05-25 DIAGNOSIS — Z515 Encounter for palliative care: Secondary | ICD-10-CM

## 2019-05-25 DIAGNOSIS — Z7982 Long term (current) use of aspirin: Secondary | ICD-10-CM | POA: Diagnosis not present

## 2019-05-25 DIAGNOSIS — E871 Hypo-osmolality and hyponatremia: Secondary | ICD-10-CM | POA: Diagnosis present

## 2019-05-25 DIAGNOSIS — R634 Abnormal weight loss: Secondary | ICD-10-CM | POA: Diagnosis present

## 2019-05-25 DIAGNOSIS — N200 Calculus of kidney: Secondary | ICD-10-CM | POA: Diagnosis present

## 2019-05-25 DIAGNOSIS — Z96643 Presence of artificial hip joint, bilateral: Secondary | ICD-10-CM | POA: Diagnosis present

## 2019-05-25 DIAGNOSIS — M6281 Muscle weakness (generalized): Secondary | ICD-10-CM | POA: Diagnosis present

## 2019-05-25 DIAGNOSIS — M069 Rheumatoid arthritis, unspecified: Secondary | ICD-10-CM | POA: Diagnosis present

## 2019-05-25 DIAGNOSIS — Z8042 Family history of malignant neoplasm of prostate: Secondary | ICD-10-CM | POA: Diagnosis not present

## 2019-05-25 LAB — RESPIRATORY PANEL BY PCR

## 2019-05-25 LAB — URINALYSIS, ROUTINE W REFLEX MICROSCOPIC
Bacteria, UA: NONE SEEN
Bilirubin Urine: NEGATIVE
Glucose, UA: NEGATIVE mg/dL
Hgb urine dipstick: NEGATIVE
Ketones, ur: NEGATIVE mg/dL
Leukocytes,Ua: NEGATIVE
Nitrite: NEGATIVE
Protein, ur: 30 mg/dL — AB
RBC / HPF: >50 RBC/hpf — ABNORMAL HIGH (ref 0–5)
Specific Gravity, Urine: 1.03 (ref 1.005–1.030)
pH: 5 (ref 5.0–8.0)

## 2019-05-25 LAB — INFLUENZA PANEL BY PCR (TYPE A & B)
Influenza A By PCR: NEGATIVE
Influenza B By PCR: NEGATIVE

## 2019-05-25 LAB — CBC
HCT: 28.3 % — ABNORMAL LOW (ref 39.0–52.0)
Hemoglobin: 8.9 g/dL — ABNORMAL LOW (ref 13.0–17.0)
MCH: 26.5 pg (ref 26.0–34.0)
MCHC: 31.4 g/dL (ref 30.0–36.0)
MCV: 84.2 fL (ref 80.0–100.0)
Platelets: 508 10*3/uL — ABNORMAL HIGH (ref 150–400)
RBC: 3.36 MIL/uL — ABNORMAL LOW (ref 4.22–5.81)
RDW: 15.2 % (ref 11.5–15.5)
WBC: 8.9 10*3/uL (ref 4.0–10.5)
nRBC: 0 % (ref 0.0–0.2)

## 2019-05-25 LAB — RETIC PANEL
Immature Retic Fract: 9 % (ref 2.3–15.9)
RBC.: 3.37 MIL/uL — ABNORMAL LOW (ref 4.22–5.81)
Retic Count, Absolute: 68.7 10*3/uL (ref 19.0–186.0)
Retic Ct Pct: 2 % (ref 0.4–3.1)
Reticulocyte Hemoglobin: 26.5 pg — ABNORMAL LOW (ref >27.9)

## 2019-05-25 LAB — CREATININE, SERUM
Creatinine, Ser: 0.64 mg/dL (ref 0.61–1.24)
GFR calc Af Amer: >60 mL/min (ref >60)
GFR calc non Af Amer: >60 mL/min (ref >60)

## 2019-05-25 LAB — PROCALCITONIN: Procalcitonin: 0.19 ng/mL

## 2019-05-25 LAB — SARS CORONAVIRUS 2 BY RT PCR (HOSPITAL ORDER, PERFORMED IN ~~LOC~~ HOSPITAL LAB): SARS Coronavirus 2: NEGATIVE

## 2019-05-25 LAB — STREP PNEUMONIAE URINARY ANTIGEN: Strep Pneumo Urinary Antigen: NEGATIVE

## 2019-05-25 MED ORDER — FOLIC ACID 1 MG PO TABS
1.0000 mg | ORAL_TABLET | Freq: Every day | ORAL | Status: DC
  Administered 2019-05-25 – 2019-05-27 (×3): 1 mg via ORAL
  Filled 2019-05-25 (×3): qty 1

## 2019-05-25 MED ORDER — SODIUM CHLORIDE 0.9 % IV SOLN
2.0000 g | INTRAVENOUS | Status: DC
  Administered 2019-05-25 – 2019-05-27 (×3): 2 g via INTRAVENOUS
  Filled 2019-05-25 (×3): qty 2

## 2019-05-25 MED ORDER — SODIUM CHLORIDE 0.9 % IV SOLN
500.0000 mg | INTRAVENOUS | Status: DC
  Administered 2019-05-25 – 2019-05-26 (×2): 500 mg via INTRAVENOUS
  Filled 2019-05-25 (×2): qty 500

## 2019-05-25 MED ORDER — VANCOMYCIN HCL IN DEXTROSE 1-5 GM/200ML-% IV SOLN
1000.0000 mg | Freq: Once | INTRAVENOUS | Status: AC
  Administered 2019-05-25: 1000 mg via INTRAVENOUS
  Filled 2019-05-25: qty 200

## 2019-05-25 MED ORDER — VANCOMYCIN HCL IN DEXTROSE 750-5 MG/150ML-% IV SOLN
750.0000 mg | Freq: Once | INTRAVENOUS | Status: AC
  Administered 2019-05-25: 750 mg via INTRAVENOUS
  Filled 2019-05-25: qty 150

## 2019-05-25 MED ORDER — SODIUM CHLORIDE 0.9 % IV BOLUS
1000.0000 mL | Freq: Once | INTRAVENOUS | Status: AC
  Administered 2019-05-25: 1000 mL via INTRAVENOUS

## 2019-05-25 MED ORDER — BENZONATATE 100 MG PO CAPS: 200.0000 mg | ORAL_CAPSULE | Freq: Three times a day (TID) | ORAL | Status: DC | PRN

## 2019-05-25 MED ORDER — ENOXAPARIN SODIUM 40 MG/0.4ML ~~LOC~~ SOLN
40.0000 mg | SUBCUTANEOUS | Status: DC
  Filled 2019-05-25: qty 0.4

## 2019-05-25 MED ORDER — SODIUM CHLORIDE 0.9 % IV SOLN
2.0000 g | Freq: Once | INTRAVENOUS | Status: AC
  Administered 2019-05-25: 06:00:00 2 g via INTRAVENOUS
  Filled 2019-05-25: qty 2

## 2019-05-25 MED ORDER — METHOTREXATE 2.5 MG PO TABS: 10.0000 mg | ORAL_TABLET | ORAL | Status: DC

## 2019-05-25 MED ORDER — SODIUM CHLORIDE 0.9 % IV SOLN
INTRAVENOUS | Status: DC
  Administered 2019-05-25 – 2019-05-26 (×3): via INTRAVENOUS

## 2019-05-25 MED ORDER — IOHEXOL 300 MG/ML  SOLN
100.0000 mL | Freq: Once | INTRAMUSCULAR | Status: AC | PRN
  Administered 2019-05-25: 100 mL via INTRAVENOUS

## 2019-05-25 MED ORDER — ASPIRIN 81 MG PO CHEW
81.0000 mg | CHEWABLE_TABLET | Freq: Every day | ORAL | Status: DC
  Administered 2019-05-25 – 2019-05-27 (×3): 81 mg via ORAL
  Filled 2019-05-25 (×3): qty 1

## 2019-05-25 MED ORDER — DOXAZOSIN MESYLATE 4 MG PO TABS
4.0000 mg | ORAL_TABLET | Freq: Every day | ORAL | Status: DC
  Administered 2019-05-25 – 2019-05-27 (×3): 4 mg via ORAL
  Filled 2019-05-25 (×3): qty 1

## 2019-05-25 MED ORDER — FINASTERIDE 5 MG PO TABS
5.0000 mg | ORAL_TABLET | Freq: Every day | ORAL | Status: DC
  Administered 2019-05-25 – 2019-05-27 (×3): 5 mg via ORAL
  Filled 2019-05-25 (×3): qty 1

## 2019-05-25 NOTE — ED Provider Notes (Signed)
[facility] Emergency Department Provider Note  ____________________________________________   First MD Initiated Contact with Patient 05/25/19 0239     (approximate)  I have reviewed the triage vital signs and the nursing notes.   HISTORY  Chief Complaint Weakness    HPI [name] Foster is a 78 y.o. male with medical history as listed below which notably includes prostate cancer and apparently recently started on methotrexate.  He presents by EMS from his facility for gradually worsening weakness for multiple weeks.  He has had a persistent dry cough and has been treated for pneumonia within the last week but he is getting worse.  He has had weight loss, fever as high as 101.5 recorded tonight by EMS, chills, and loss of appetite.  He denies nausea, vomiting, chest pain, and abdominal pain.  He denies any shortness of breath.  He says he has been tested twice previously for COVID-19 and both tests have been negative.  His symptoms are severe and have been gradual in onset and nothing particular makes things better or worse.         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   Abnormal finding on chest xray 07/18/2013   Chronic cough 07/18/2013   Tests ordered 09/03/2012   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   Benign localized prostatic hyperplasia with lower urinary tract symptoms (LUTS) 07/07/2012   Elevated prostate  specific antigen (PSA) 07/07/2012    Past Surgical History:  Procedure Laterality Date   HEMORROIDECTOMY     HERNIA REPAIR     JOINT REPLACEMENT Left    THR   PROSTATE BIOPSY  2013   TOTAL HIP ARTHROPLASTY Left    TOTAL HIP ARTHROPLASTY Right 04/26/2019   Procedure: TOTAL HIP ARTHROPLASTY ANTERIOR APPROACH;  Surgeon: Hessie Knows, MD;  Location: [facility] ORS;  Service: Orthopedics;  Laterality: Right;    Prior to Admission medications   Medication Sig Start Date End Date Taking? Authorizing Provider  aspirin 81 MG chewable tablet Chew 1 tablet (81 mg total) by mouth 2 (two) times daily for 30 days. 04/27/19 05/27/19  Duanne Guess, PA-C  docusate sodium (COLACE) 100 MG capsule Take 1 capsule (100 mg total) by mouth 2 (two) times daily. 04/27/19   Duanne Guess, PA-C  doxazosin (CARDURA) 4 MG tablet Take 1 tablet (4 mg total) by mouth daily. 08/09/18   Stoioff, Ronda Fairly, MD  ferrous [id number] C-folic acid (TRINSICON / FOLTRIN) capsule Take 1 capsule by mouth 2 (two) times daily. 04/28/19   Duanne Guess, PA-C  finasteride (PROSCAR) 5 MG tablet Take 1 tablet (5 mg total) by mouth daily. 08/09/18   Stoioff, Ronda Fairly, MD  folic acid (FOLVITE) 1 MG tablet Take 1 mg by mouth daily. 05/11/19   [provider]  HYDROcodone-acetaminophen (NORCO/VICODIN) 5-325 MG tablet Take 1-2 tablets by mouth every 4 (four) hours as needed for moderate pain (pain score 4-6). 04/27/19   Dorise Hiss  C, PA-C  methotrexate 2.5 MG tablet Take 10 mg by mouth once a week. 05/11/19   [provider]  multivitamin-iron-minerals-folic acid (CENTRUM) chewable tablet Chew 1 tablet by mouth daily.    [provider]    Allergies Patient has no known allergies.  Family History  Problem Relation Age of Onset   Prostate cancer Father    Cancer Father    Prostate cancer Brother    Cancer Mother     Social History Social History   Tobacco Use   Smoking status: Former  Smoker    Quit date: 06/04/1980    Years since quitting: 38.9   Smokeless tobacco: Never Used  Substance Use Topics   Alcohol use: No   Drug use: No    Review of Systems Constitutional: +fever/chills.  Malaise and fatigue and generalized weakness. Eyes: No visual changes. ENT: No sore throat. Cardiovascular: Denies chest pain. Respiratory: Denies shortness of breath.  +Cough. Gastrointestinal: No abdominal pain.  No nausea, no vomiting.  No diarrhea.  No constipation. Genitourinary: Negative for dysuria. Musculoskeletal: Negative for neck pain.  Negative for back pain. Integumentary: Negative for rash. Neurological: Negative for headaches, focal weakness or numbness.   ____________________________________________   PHYSICAL EXAM:  VITAL SIGNS: ED Triage Vitals  Enc Vitals Group     BP 05/24/19 2144 (!) 113/45     Pulse Rate 05/24/19 2144 (!) 114     Resp 05/24/19 2144 16     Temp 05/24/19 2144 100.3 F (37.9 C)     Temp Source 05/24/19 2144 Oral     SpO2 05/24/19 2144 96 %     Weight 05/24/19 2145 73.9 kg (163 lb)     Height 05/24/19 2145 1.727 m (_0 )     Head Circumference --      Peak Flow --      Pain Score 05/24/19 2145 0     Pain Loc --      Pain Edu? --      Excl. in [place]? --     Constitutional: Alert and oriented.  Appears chronically ill but in no acute distress. Eyes: Conjunctivae are normal.  Head: Atraumatic. Nose: No congestion/rhinnorhea. Mouth/Throat: Mucous membranes are moist. Neck: No stridor.  No meningeal signs.   Cardiovascular: Mild tachycardia, regular rhythm. Good peripheral circulation. Grossly normal heart sounds. Respiratory: Normal respiratory effort.  No retractions. No audible wheezing. Gastrointestinal: Soft and nontender. No distention.  Musculoskeletal: No lower extremity tenderness nor edema. No gross deformities of extremities. Neurologic:  Normal speech and language. No gross focal neurologic deficits are appreciated.    Skin:  Skin is warm, dry and intact. No rash noted. Psychiatric: Mood and affect are normal. Speech and behavior are normal.  ____________________________________________   LABS (all labs ordered are listed, but only abnormal results are displayed)  Labs Reviewed  COMPREHENSIVE METABOLIC PANEL - Abnormal; Notable for the following components:      Result Value   Sodium 130 (*)    Chloride 94 (*)    Glucose, Bld 173 (*)    Calcium 8.0 (*)    Albumin 2.1 (*)    Alkaline Phosphatase 258 (*)    All other components within normal limits  CBC WITH DIFFERENTIAL/PLATELET - Abnormal; Notable for the following components:   WBC 11.0 (*)    RBC 3.41 (*)    Hemoglobin 9.0 (*)    HCT 28.3 (*)    Platelets 575 (*)    Neutro Abs 7.9 (*)  All other components within normal limits  URINALYSIS, ROUTINE W REFLEX MICROSCOPIC - Abnormal; Notable for the following components:   Color, Urine AMBER (*)    APPearance HAZY (*)    Protein, ur 30 (*)    RBC / HPF >50 (*)    All other components within normal limits  SARS CORONAVIRUS 2 (HOSPITAL ORDER, [facility])  CULTURE, BLOOD (ROUTINE X 2)  CULTURE, BLOOD (ROUTINE X 2)  URINE CULTURE  LACTIC ACID, PLASMA  PROCALCITONIN   ____________________________________________  EKG  ED ECG REPORT I, Hinda Kehr, the attending physician, personally viewed and interpreted this ECG.  Date: 05/24/2019 EKG Time: 21: 43 Rate: 115 Rhythm: Sinus tachycardia QRS Axis: normal Intervals: normal ST/T Wave abnormalities: normal Narrative Interpretation: no evidence of acute ischemia  ____________________________________________  RADIOLOGY I, Hinda Kehr, personally viewed and evaluated these images (plain radiographs) as part of my medical decision making, as well as reviewing the written report by the radiologist.  ED MD interpretation: No acute abnormalities on chest x-ray.  Chronic atypical appearing infection on CT  chest, no acute abnormalities on abd/pelvis scan  Official radiology report(s): Ct Chest W Contrast  Result Date: 05/25/2019 CLINICAL DATA:  Chronic dyspnea.  Fever. EXAM: CT CHEST, ABDOMEN, AND PELVIS WITH CONTRAST TECHNIQUE: Multidetector CT imaging of the chest, abdomen and pelvis was performed following the standard protocol during bolus administration of intravenous contrast. CONTRAST:  154m OMNIPAQUE IOHEXOL 300 MG/ML  SOLN COMPARISON:  Chest CT from 9 days ago. Abdomen and pelvis CT 06/25/2010 FINDINGS: CT CHEST FINDINGS Cardiovascular: Normal heart size. Trace pericardial fluid. No acute vascular finding. Mediastinum/Nodes: Stable prominent bilateral hilar lymph nodes which are symmetric and noncalcified. Lungs/Pleura: Unchanged airway thickening with asymmetric right-sided pulmonary reticulation with a mild subpleural predilection. This could reflect atypical infection, sequela of aspiration, or inflammatory pneumonia. No acute airspace disease, edema, effusion, honeycombing, or pneumothorax. Musculoskeletal: Degenerative disease without acute finding CT ABDOMEN PELVIS FINDINGS Hepatobiliary: No focal liver abnormality.No evidence of biliary obstruction or stone. Pancreas: Unremarkable. Spleen: Unremarkable. Adrenals/Urinary Tract: 11 mm left adrenal adenoma that remain size stable. 6 mm left lower pole renal calculus. No hydronephrosis or ureteral stone. Limited assessment of the bladder due to streak artifact. Tiny right renal cystic density. Stomach/Bowel:  No obstruction. No evidence of bowel inflammation Vascular/Lymphatic: Mild atherosclerosis. Prominent peripancreatic arterial arcade with tortuous hepatic artery, stable. No underlying obstructive process is seen. No mass or adenopathy. Reproductive:Pelvic structures are largely obscured by hip prostheses. Other: No ascites or pneumoperitoneum. Musculoskeletal: Advanced lower lumbar facet and disc degeneration with multilevel listhesis and  scoliosis. IMPRESSION: Chest CT: 1. No acute finding or change from 05/16/2019. 2. Asymmetric reticulation in the right lung as described previously, please see follow-up recommendations at that time. Abdominal CT: 1. No emergent finding. 2. Left nephrolithiasis and small left adrenal adenoma. Electronically Signed   By: JMonte FantasiaM.D.   On: 05/25/2019 05:22   Ct Abdomen Pelvis W Contrast  Result Date: 05/25/2019 CLINICAL DATA:  Chronic dyspnea.  Fever. EXAM: CT CHEST, ABDOMEN, AND PELVIS WITH CONTRAST TECHNIQUE: Multidetector CT imaging of the chest, abdomen and pelvis was performed following the standard protocol during bolus administration of intravenous contrast. CONTRAST:  1098mOMNIPAQUE IOHEXOL 300 MG/ML  SOLN COMPARISON:  Chest CT from 9 days ago. Abdomen and pelvis CT 06/25/2010 FINDINGS: CT CHEST FINDINGS Cardiovascular: Normal heart size. Trace pericardial fluid. No acute vascular finding. Mediastinum/Nodes: Stable prominent bilateral hilar lymph nodes which are symmetric and noncalcified. Lungs/Pleura:  Unchanged airway thickening with asymmetric right-sided pulmonary reticulation with a mild subpleural predilection. This could reflect atypical infection, sequela of aspiration, or inflammatory pneumonia. No acute airspace disease, edema, effusion, honeycombing, or pneumothorax. Musculoskeletal: Degenerative disease without acute finding CT ABDOMEN PELVIS FINDINGS Hepatobiliary: No focal liver abnormality.No evidence of biliary obstruction or stone. Pancreas: Unremarkable. Spleen: Unremarkable. Adrenals/Urinary Tract: 11 mm left adrenal adenoma that remain size stable. 6 mm left lower pole renal calculus. No hydronephrosis or ureteral stone. Limited assessment of the bladder due to streak artifact. Tiny right renal cystic density. Stomach/Bowel:  No obstruction. No evidence of bowel inflammation Vascular/Lymphatic: Mild atherosclerosis. Prominent peripancreatic arterial arcade with tortuous  hepatic artery, stable. No underlying obstructive process is seen. No mass or adenopathy. Reproductive:Pelvic structures are largely obscured by hip prostheses. Other: No ascites or pneumoperitoneum. Musculoskeletal: Advanced lower lumbar facet and disc degeneration with multilevel listhesis and scoliosis. IMPRESSION: Chest CT: 1. No acute finding or change from 05/16/2019. 2. Asymmetric reticulation in the right lung as described previously, please see follow-up recommendations at that time. Abdominal CT: 1. No emergent finding. 2. Left nephrolithiasis and small left adrenal adenoma. Electronically Signed   By: Monte Fantasia M.D.   On: 05/25/2019 05:22   Dg Chest Portable 1 View  Result Date: 05/25/2019 CLINICAL DATA:  Fever cough and weakness EXAM: PORTABLE CHEST 1 VIEW COMPARISON:  07/12/2013 FINDINGS: The heart size and mediastinal contours are within normal limits. Both lungs are clear. The visualized skeletal structures are unremarkable. IMPRESSION: No active disease. Electronically Signed   By: Donavan Foil M.D.   On: 05/25/2019 03:05    ____________________________________________   PROCEDURES   Procedure(s) performed (including Critical Care):  .Critical Care Performed by: Hinda Kehr, MD Authorized by: Hinda Kehr, MD   Critical care provider statement:    Critical care time (minutes):  30   Critical care time was exclusive of:  Separately billable procedures and treating other patients   Critical care was necessary to treat or prevent imminent or life-threatening deterioration of the following conditions:  Sepsis   Critical care was time spent personally by me on the following activities:  Development of treatment plan with patient or surrogate, discussions with consultants, evaluation of patient's response to treatment, examination of patient, obtaining history from patient or surrogate, ordering and performing treatments and interventions, ordering and review of laboratory  studies, ordering and review of radiographic studies, pulse oximetry, re-evaluation of patient's condition and review of old charts     ____________________________________________   [place] / MDM / [place] / ED COURSE  As part of my medical decision making, I reviewed the following data within the [place] notes reviewed and incorporated, Labs reviewed , EKG interpreted , Old chart reviewed, Radiograph reviewed , Discussed with admitting physician  and Notes from prior ED visits   Differential diagnosis includes, but is not limited to, sepsis of anyone with number of sources including urinary tract infection or pneumonia, COVID-19, acute intra-abdominal infection.  The patient is not able to provide much history.  Lab work is pending and I am essentially doing a septic work-up but have not called a code sepsis because I do not want to inadvertently ruin the work-up by giving antibiotics prior to getting all the specimens including urine.  I am giving 1 L normal saline IV bolus.  He is currently in no distress and his chest x-ray is clear.  No evidence of ischemia on EKG.  Testing rapid COVID-19  swab given fever.      Clinical Course as of May 24 625  Wed May 25, 2019  0343 UA unremarkable except for some hematuria of unclear clinical significance  RBC / HPF(!): >50 [CF]  0343 CMP notable for Na 130, Alk Phos 258, otherwise unremarkable  Comprehensive metabolic panel(!) [CF]  0973 Mild leukocytosis  WBC(!): 11.0 [CF]  0344 Lactic Acid, Venous: 1.0 [CF]  0432 SARS Coronavirus 2: NEGATIVE [CF]  0602 The patient's CT scans did not show any acute abnormalities.  The patient does have an area in his lung that could represent a persistent atypical infection.  Given his fever and tachycardia he meets sepsis criteria.  Cultures have been sent.  Given his recent hospitalization and his immunocompromise status on methotrexate I am giving cefepime 2  g IV and vancomycin 1 g IV.  He does not need any more IV fluids at this time.  I discussed the case with Dr. Marcille Blanco with the hospitalist service who will admit.   [CF]    Clinical Course User Index [CF] Hinda Kehr, MD     ____________________________________________  FINAL CLINICAL IMPRESSION(S) / ED DIAGNOSES  Final diagnoses:  Sepsis, due to unspecified organism, unspecified whether acute organ dysfunction present [facility])  Atypical pneumonia     MEDICATIONS GIVEN DURING THIS VISIT:  Medications  vancomycin (VANCOCIN) IVPB 1000 mg/200 mL premix (1,000 mg Intravenous New Bag/Given 05/25/19 0603)  ceFEPIme (MAXIPIME) 2 g in sodium chloride 0.9 % 100 mL IVPB (2 g Intravenous New Bag/Given 05/25/19 0605)  vancomycin (VANCOCIN) IVPB 750 mg/150 ml premix (has no administration in time range)  sodium chloride 0.9 % bolus 1,000 mL (1,000 mLs Intravenous New Bag/Given 05/25/19 0316)  iohexol (OMNIPAQUE) 300 MG/ML solution 100 mL (100 mLs Intravenous Contrast Given 05/25/19 0439)     ED Discharge Orders    None      *Please note:  BRYTEN MAHER was evaluated in Emergency Department on 05/25/2019 for the symptoms described in the history of present illness. He was evaluated in the context of the global COVID-19 pandemic, which necessitated consideration that the patient might be at risk for infection with the SARS-CoV-2 virus that causes COVID-19. Institutional protocols and algorithms that pertain to the evaluation of patients at risk for COVID-19 are in a state of rapid change based on information released by regulatory bodies including the CDC and federal and state organizations. These policies and algorithms were followed during the patient's care in the ED.  Some ED evaluations and interventions may be delayed as a result of limited staffing during the pandemic.*  Note:  This document was prepared using Dragon voice recognition software and may include unintentional dictation  errors.   Hinda Kehr, MD 05/25/19 [id number]

## 2019-05-25 NOTE — Progress Notes (Signed)
PHARMACY -  BRIEF ANTIBIOTIC NOTE   Pharmacy has received consult(s) for vancomycin/cefepime from an ED provider.  The patient's profile has been reviewed for ht/wt/allergies/indication/available labs.    One time order(s) placed for vancomycin 1.75g IV load (given as 1g + 750 mg), Cefepime 2g IV x 1  Further antibiotics/pharmacy consults should be ordered by admitting physician if indicated.                       Thank you,  Tobie Lords, PharmD, BCPS Clinical Pharmacist 05/25/2019  6:08 AM

## 2019-05-25 NOTE — ED Notes (Signed)
ED TO INPATIENT HANDOFF REPORT  ED Nurse Name and Phone #:  Maudie Mercury [phone]  S Name/Age/Gender [name] Foster 78 y.o. male Room/Bed: ED09A/ED09A  Code Status   Code Status: Prior  Home/SNF/Other Home Patient oriented to: self, place, time and situation Is this baseline? Yes   Triage Complete: Triage complete  Chief Complaint Weakness  EMS  Triage Note Pt to ED via EMS from home with weakness x multiple weeks. Pt has had a dry cough and dx for pneumonia within the last week. No pain reported. Pt placed on methotrexate and family reporting the weakness has worsened since starting that medication. Pt also reporting a 20lb weight loss since starting medication.  101.5 temp with EMS   Allergies No Known Allergies  Level of Care/Admitting Diagnosis ED Disposition    ED Disposition Condition [facility] [place] [[id number]]  Level of Care: Med-Surg [16]  Covid Evaluation: Confirmed COVID Negative  Diagnosis: Pneumonia [[id number]]  Admitting Physician: Max Sane [[id number]]  Attending Physician: Max Sane [[id number]]  Estimated length of stay: past midnight tomorrow  Certification:: I certify this patient will need inpatient services for at least 2 midnights  PT Class (Do Not Modify): Inpatient [101]  PT Acc Code (Do Not Modify): Private [1]       B Medical/Surgery History Past Medical History:  Diagnosis Date  . Arthritis   . BPH (benign prostatic hyperplasia)   . Former smoker, stopped smoking many years ago   . History of needle biopsy of prostate with negative result 2013  . Hypertension   . Prostate enlargement   . Prostatic intraepithelial neoplasm III 2013   Past Surgical History:  Procedure Laterality Date  . HEMORROIDECTOMY    . HERNIA REPAIR    . JOINT REPLACEMENT Left    THR  . PROSTATE BIOPSY  2013  . TOTAL HIP ARTHROPLASTY Left   . TOTAL HIP ARTHROPLASTY Right 04/26/2019   Procedure: TOTAL HIP ARTHROPLASTY  ANTERIOR APPROACH;  Surgeon: Hessie Knows, MD;  Location: [facility] ORS;  Service: Orthopedics;  Laterality: Right;     A IV Location/Drains/Wounds Patient Lines/Drains/Airways Status   Active Line/Drains/Airways    Name:   Placement date:   Placement time:   Site:   Days:   Peripheral IV 05/25/19 Left Wrist   05/25/19    0316    Wrist   less than 1   Peripheral IV 05/25/19 Left Antecubital   05/25/19    0316    Antecubital   less than 1   Negative Pressure Wound Therapy Hip Right   04/26/19    0850    -   29   Airway 7.5 mm   04/26/19    0728     29   Incision (Closed) 04/26/19 Hip Right   04/26/19    0848     29          Intake/Output Last 24 hours  Intake/Output Summary (Last 24 hours) at 05/25/2019 6812 Last data filed at 05/25/2019 0645 Gross per 24 hour  Intake 93.22 ml  Output -  Net 93.22 ml    Labs/Imaging Results for orders placed or performed during the hospital encounter of 05/25/19 (from the past 48 hour(s))  Lactic acid, plasma     Status: None   Collection Time: 05/24/19  9:57 PM  Result Value Ref Range   Lactic Acid, Venous 1.0 0.5 - 1.9 mmol/L    Comment: Performed at [facility], [address] [place], [place] [address]  Comprehensive metabolic panel     Status: Abnormal   Collection Time: 05/24/19  9:57 PM  Result Value Ref Range   Sodium 130 (L) 135 - 145 mmol/L   Potassium 4.1 3.5 - 5.1 mmol/L   Chloride 94 (L) 98 - 111 mmol/L   CO2 28 22 - 32 mmol/L   Glucose, Bld 173 (H) 70 - 99 mg/dL   BUN 16 8 - 23 mg/dL   Creatinine, Ser 0.79 0.61 - 1.24 mg/dL   Calcium 8.0 (L) 8.9 - 10.3 mg/dL   Total Protein 6.7 6.5 - 8.1 g/dL   Albumin 2.1 (L) 3.5 - 5.0 g/dL   AST 26 15 - 41 U/L   ALT 27 0 - 44 U/L   Alkaline Phosphatase 258 (H) 38 - 126 U/L   Total Bilirubin 0.8 0.3 - 1.2 mg/dL   GFR calc non Af Amer >60 >60 mL/min   GFR calc Af Amer >60 >60 mL/min   Anion gap 8 5 - 15    Comment: Performed at [facility], [address]  [place], [place] [address]  CBC with Differential     Status: Abnormal   Collection Time: 05/24/19  9:57 PM  Result Value Ref Range   WBC 11.0 (H) 4.0 - 10.5 K/uL   RBC 3.41 (L) 4.22 - 5.81 MIL/uL   Hemoglobin 9.0 (L) 13.0 - 17.0 g/dL   HCT 28.3 (L) 39.0 - 52.0 %   MCV 83.0 80.0 - 100.0 fL   MCH 26.4 26.0 - 34.0 pg   MCHC 31.8 30.0 - 36.0 g/dL   RDW 15.3 11.5 - 15.5 %   Platelets 575 (H) 150 - 400 K/uL   nRBC 0.0 0.0 - 0.2 %   Neutrophils Relative % 71 %   Neutro Abs 7.9 (H) 1.7 - 7.7 K/uL   Lymphocytes Relative 19 %   Lymphs Abs 2.1 0.7 - 4.0 K/uL   Monocytes Relative 8 %   Monocytes Absolute 0.9 0.1 - 1.0 K/uL   Eosinophils Relative 1 %   Eosinophils Absolute 0.1 0.0 - 0.5 K/uL   Basophils Relative 0 %   Basophils Absolute 0.0 0.0 - 0.1 K/uL   Immature Granulocytes 1 %   Abs Immature Granulocytes 0.06 0.00 - 0.07 K/uL    Comment: Performed at [facility], [address] [place], [place] [address]  Procalcitonin     Status: None   Collection Time: 05/24/19  9:57 PM  Result Value Ref Range   Procalcitonin 0.19 ng/mL    Comment:        Interpretation: PCT (Procalcitonin) <= 0.5 ng/mL: Systemic infection (sepsis) is not likely. Local bacterial infection is possible. (NOTE)       Sepsis PCT Algorithm           Lower Respiratory Tract                                      Infection PCT Algorithm    ----------------------------     ----------------------------         PCT < 0.25 ng/mL                PCT < 0.10 ng/mL         Strongly encourage             Strongly discourage   discontinuation of  antibiotics    initiation of antibiotics    ----------------------------     -----------------------------       PCT 0.25 - 0.50 ng/mL            PCT 0.10 - 0.25 ng/mL               OR       >80% decrease in PCT            Discourage initiation of                                            antibiotics      Encourage discontinuation           of antibiotics     ----------------------------     -----------------------------         PCT >= 0.50 ng/mL              PCT 0.26 - 0.50 ng/mL               AND        <80% decrease in PCT             Encourage initiation of                                             antibiotics       Encourage continuation           of antibiotics    ----------------------------     -----------------------------        PCT >= 0.50 ng/mL                  PCT > 0.50 ng/mL               AND         increase in PCT                  Strongly encourage                                      initiation of antibiotics    Strongly encourage escalation           of antibiotics                                     -----------------------------                                           PCT <= 0.25 ng/mL                                                 OR                                        >  80% decrease in PCT                                     Discontinue / Do not initiate                                             antibiotics Performed at [facility], [address] [place], [place] [address]   Urinalysis, Routine w reflex microscopic (not at [facility])     Status: Abnormal   Collection Time: 05/25/19  3:06 AM  Result Value Ref Range   Color, Urine AMBER (A) YELLOW    Comment: BIOCHEMICALS MAY BE AFFECTED BY COLOR   APPearance HAZY (A) CLEAR   Specific Gravity, Urine 1.030 1.005 - 1.030   pH 5.0 5.0 - 8.0   Glucose, UA NEGATIVE NEGATIVE mg/dL   Hgb urine dipstick NEGATIVE NEGATIVE   Bilirubin Urine NEGATIVE NEGATIVE   Ketones, ur NEGATIVE NEGATIVE mg/dL   Protein, ur 30 (A) NEGATIVE mg/dL   Nitrite NEGATIVE NEGATIVE   Leukocytes,Ua NEGATIVE NEGATIVE   RBC / HPF >50 (H) 0 - 5 RBC/hpf   WBC, UA 0-5 0 - 5 WBC/hpf   Bacteria, UA NONE SEEN NONE SEEN   Squamous Epithelial / LPF 0-5 0 - 5   Mucus PRESENT    Hyaline Casts, UA PRESENT     Comment: Performed at [facility], [address]  [place], [place] [address]  SARS Coronavirus 2 (CEPHEID- Performed in [place] hospital lab), Hosp Order     Status: None   Collection Time: 05/25/19  3:06 AM   Specimen: Nasopharyngeal Swab  Result Value Ref Range   SARS Coronavirus 2 NEGATIVE NEGATIVE    Comment: (NOTE) If result is NEGATIVE SARS-CoV-2 target nucleic acids are NOT DETECTED. The SARS-CoV-2 RNA is generally detectable in upper and lower  respiratory specimens during the acute phase of infection. The lowest  concentration of SARS-CoV-2 viral copies this assay can detect is 250  copies / mL. A negative result does not preclude SARS-CoV-2 infection  and should not be used as the sole basis for treatment or other  patient management decisions.  A negative result may occur with  improper specimen collection / handling, submission of specimen other  than nasopharyngeal swab, presence of viral mutation(s) within the  areas targeted by this assay, and inadequate number of viral copies  (<250 copies / mL). A negative result must be combined with clinical  observations, patient history, and epidemiological information. If result is POSITIVE SARS-CoV-2 target nucleic acids are DETECTED. The SARS-CoV-2 RNA is generally detectable in upper and lower  respiratory specimens dur ing the acute phase of infection.  Positive  results are indicative of active infection with SARS-CoV-2.  Clinical  correlation with patient history and other diagnostic information is  necessary to determine patient infection status.  Positive results do  not rule out bacterial infection or co-infection with other viruses. If result is PRESUMPTIVE POSTIVE SARS-CoV-2 nucleic acids MAY BE PRESENT.   A presumptive positive result was obtained on the submitted specimen  and confirmed on repeat testing.  While 2019 novel coronavirus  (SARS-CoV-2) nucleic acids may be present in the submitted sample  additional confirmatory testing may be necessary for  epidemiological  and / or clinical management  purposes  to differentiate between  SARS-CoV-2 and other Sarbecovirus currently known to infect humans.  If clinically indicated additional testing with an alternate test  methodology [phone]) is advised. The SARS-CoV-2 RNA is generally  detectable in upper and lower respiratory sp ecimens during the acute  phase of infection. The expected result is Negative. Fact Sheet for Patients:  StrictlyIdeas.no Fact Sheet for Healthcare Providers: BankingDealers.co.za This test is not yet approved or cleared by the [place] FDA and has been authorized for detection and/or diagnosis of SARS-CoV-2 by FDA under an Emergency Use Authorization (EUA).  This EUA will remain in effect (meaning this test can be used) for the duration of the COVID-19 declaration under Section 564(b)(1) of the Act, 21 U.S.C. section 360bbb-3(b)(1), unless the authorization is terminated or revoked sooner. Performed at [facility], [address] [place], [place] [address]    Ct Chest W Contrast  Result Date: 05/25/2019 CLINICAL DATA:  Chronic dyspnea.  Fever. EXAM: CT CHEST, ABDOMEN, AND PELVIS WITH CONTRAST TECHNIQUE: Multidetector CT imaging of the chest, abdomen and pelvis was performed following the standard protocol during bolus administration of intravenous contrast. CONTRAST:  147mL OMNIPAQUE IOHEXOL 300 MG/ML  SOLN COMPARISON:  Chest CT from 9 days ago. Abdomen and pelvis CT 06/25/2010 FINDINGS: CT CHEST FINDINGS Cardiovascular: Normal heart size. Trace pericardial fluid. No acute vascular finding. Mediastinum/Nodes: Stable prominent bilateral hilar lymph nodes which are symmetric and noncalcified. Lungs/Pleura: Unchanged airway thickening with asymmetric right-sided pulmonary reticulation with a mild subpleural predilection. This could reflect atypical infection, sequela of aspiration, or inflammatory pneumonia.  No acute airspace disease, edema, effusion, honeycombing, or pneumothorax. Musculoskeletal: Degenerative disease without acute finding CT ABDOMEN PELVIS FINDINGS Hepatobiliary: No focal liver abnormality.No evidence of biliary obstruction or stone. Pancreas: Unremarkable. Spleen: Unremarkable. Adrenals/Urinary Tract: 11 mm left adrenal adenoma that remain size stable. 6 mm left lower pole renal calculus. No hydronephrosis or ureteral stone. Limited assessment of the bladder due to streak artifact. Tiny right renal cystic density. Stomach/Bowel:  No obstruction. No evidence of bowel inflammation Vascular/Lymphatic: Mild atherosclerosis. Prominent peripancreatic arterial arcade with tortuous hepatic artery, stable. No underlying obstructive process is seen. No mass or adenopathy. Reproductive:Pelvic structures are largely obscured by hip prostheses. Other: No ascites or pneumoperitoneum. Musculoskeletal: Advanced lower lumbar facet and disc degeneration with multilevel listhesis and scoliosis. IMPRESSION: Chest CT: 1. No acute finding or change from 05/16/2019. 2. Asymmetric reticulation in the right lung as described previously, please see follow-up recommendations at that time. Abdominal CT: 1. No emergent finding. 2. Left nephrolithiasis and small left adrenal adenoma. Electronically Signed   By: Monte Fantasia M.D.   On: 05/25/2019 05:22   Ct Abdomen Pelvis W Contrast  Result Date: 05/25/2019 CLINICAL DATA:  Chronic dyspnea.  Fever. EXAM: CT CHEST, ABDOMEN, AND PELVIS WITH CONTRAST TECHNIQUE: Multidetector CT imaging of the chest, abdomen and pelvis was performed following the standard protocol during bolus administration of intravenous contrast. CONTRAST:  121mL OMNIPAQUE IOHEXOL 300 MG/ML  SOLN COMPARISON:  Chest CT from 9 days ago. Abdomen and pelvis CT 06/25/2010 FINDINGS: CT CHEST FINDINGS Cardiovascular: Normal heart size. Trace pericardial fluid. No acute vascular finding. Mediastinum/Nodes: Stable  prominent bilateral hilar lymph nodes which are symmetric and noncalcified. Lungs/Pleura: Unchanged airway thickening with asymmetric right-sided pulmonary reticulation with a mild subpleural predilection. This could reflect atypical infection, sequela of aspiration, or inflammatory pneumonia. No acute airspace disease, edema, effusion, honeycombing, or pneumothorax. Musculoskeletal: Degenerative disease without acute finding CT ABDOMEN PELVIS FINDINGS Hepatobiliary: No  focal liver abnormality.No evidence of biliary obstruction or stone. Pancreas: Unremarkable. Spleen: Unremarkable. Adrenals/Urinary Tract: 11 mm left adrenal adenoma that remain size stable. 6 mm left lower pole renal calculus. No hydronephrosis or ureteral stone. Limited assessment of the bladder due to streak artifact. Tiny right renal cystic density. Stomach/Bowel:  No obstruction. No evidence of bowel inflammation Vascular/Lymphatic: Mild atherosclerosis. Prominent peripancreatic arterial arcade with tortuous hepatic artery, stable. No underlying obstructive process is seen. No mass or adenopathy. Reproductive:Pelvic structures are largely obscured by hip prostheses. Other: No ascites or pneumoperitoneum. Musculoskeletal: Advanced lower lumbar facet and disc degeneration with multilevel listhesis and scoliosis. IMPRESSION: Chest CT: 1. No acute finding or change from 05/16/2019. 2. Asymmetric reticulation in the right lung as described previously, please see follow-up recommendations at that time. Abdominal CT: 1. No emergent finding. 2. Left nephrolithiasis and small left adrenal adenoma. Electronically Signed   By: Monte Fantasia M.D.   On: 05/25/2019 05:22   Dg Chest Portable 1 View  Result Date: 05/25/2019 CLINICAL DATA:  Fever cough and weakness EXAM: PORTABLE CHEST 1 VIEW COMPARISON:  07/12/2013 FINDINGS: The heart size and mediastinal contours are within normal limits. Both lungs are clear. The visualized skeletal structures are  unremarkable. IMPRESSION: No active disease. Electronically Signed   By: Donavan Foil M.D.   On: 05/25/2019 03:05    Pending Labs Unresulted Labs (From admission, onward)    Start     Ordered   05/25/19 0243  Blood Culture (routine x 2)  BLOOD CULTURE X 2,   STAT    Question:  Patient immune status  Answer:  Normal   05/25/19 0243   05/25/19 0243  Urine culture  Add-on,   AD    Question:  Patient immune status  Answer:  Normal   05/25/19 0243   Signed and Held  HIV antibody (Routine Screening)  Once,   R     Signed and Held   Signed and Held  CBC  (enoxaparin (LOVENOX)    CrCl >/= 30 ml/min)  Once,   R    Comments: Baseline for enoxaparin therapy IF NOT ALREADY DRAWN.  Notify MD if PLT < 100 K.    Signed and Held   Signed and Held  Creatinine, serum  (enoxaparin (LOVENOX)    CrCl >/= 30 ml/min)  Once,   R    Comments: Baseline for enoxaparin therapy IF NOT ALREADY DRAWN.    Signed and Held   Signed and Held  Creatinine, serum  (enoxaparin (LOVENOX)    CrCl >/= 30 ml/min)  Weekly,   R    Comments: while on enoxaparin therapy    Signed and Held   Signed and Held  Adenovirus antibodies  Once,   R     Signed and Held   Signed and Held  Respiratory Panel by PCR  (Respiratory virus panel with precautions)  Add-on,   R     Signed and Held   Signed and Held  Influenza panel by PCR (type A & B)  (Influenza PCR Panel)  Once,   R     Signed and Held   Signed and Held  Strep pneumoniae urinary antigen  Once,   R     Signed and Held   Signed and Held  Legionella Pneumophila Serogp 1 Ur Ag  Once,   R     Signed and Held   Signed and Held  Sputum culture  (Non-severe pneumonia (non-ICU care) in adult without resistant organism  risk factors.)  Once,   R     Signed and Held          Vitals/Pain Today's Vitals   05/25/19 0515 05/25/19 0600 05/25/19 0700 05/25/19 0717  BP:   105/69   Pulse: 92 91 92   Resp: (!) 21 (!) 23 (!) 22   Temp:      TempSrc:      SpO2: 97% 97% 97%   Weight:       Height:      PainSc:    Asleep    Isolation Precautions No active isolations  Medications Medications  sodium chloride 0.9 % bolus 1,000 mL (0 mLs Intravenous Stopped 05/25/19 0750)  iohexol (OMNIPAQUE) 300 MG/ML solution 100 mL (100 mLs Intravenous Contrast Given 05/25/19 0439)  vancomycin (VANCOCIN) IVPB 1000 mg/200 mL premix (0 mg Intravenous Stopped 05/25/19 0750)  ceFEPIme (MAXIPIME) 2 g in sodium chloride 0.9 % 100 mL IVPB (0 g Intravenous Stopped 05/25/19 0645)  vancomycin (VANCOCIN) IVPB 750 mg/150 ml premix (0 mg Intravenous Stopped 05/25/19 0750)    Mobility walks with person assist Low fall risk   Focused Assessments    R Recommendations: See Admitting Provider Note  Report given to:   Additional Notes:

## 2019-05-25 NOTE — Consult Note (Signed)
Consultation Note Date: 05/25/2019   Patient Name: [name] Foster  DOB: September 15, 1941  MRN: [id number]  Age / Sex: 78 y.o., male  PCP: Cyndie Chime, MD Referring Physician: Max Sane, MD  Reason for Consultation: Establishing goals of care and Psychosocial/spiritual support  HPI/Patient Profile: 78 y.o. male  with past medical history of prostatic intra-epithelial neoplasm grade 3, hypertension, former smoking, arthritis admitted on 05/25/2019 with weakness, rule out COVID.   Clinical Assessment and Goals of Care: [name] Foster is resting quietly in bed.  He greets me making and keeping eye contact.  He is alert and oriented x3, calm and cooperative.  He is able to make his basic needs known.  There is no family at bedside at this time due to visitor restrictions.    [name] Foster tells me that he would not want to continue taking methotrexate.  He shares that he feels methotrexate has caused his current health concerns and weakness.  He shares that he also attributes his weight loss to methotrexate.  [name] Foster tells me that his daughter [name] Foster is a Equities trader who teaches nursing online in [place].  He shares that she looked up some of the side effects of methotrexate and they were similar to what he is experiencing.  We talked about healthcare power of attorney, see below. We talked about CODE STATUS, see below.  PMT to continue to follow.  HCPOA     NEXT OF KIN -[name] Foster names his wife, [name] Foster as his Ambulance person    SUMMARY OF RECOMMENDATIONS   Full code/full scope Rehospitalize as needed States he would not want to take methotrexate  Code Status/Advance Care Planning:  Full code -we open discussions related to [place].  [name] Foster at this point states that he would like to attempt resuscitation if he can be helped, but he would not want to be kept alive  indefinitely  Symptom Management:   Per hospitalist, no additional needs at this time.  Palliative Prophylaxis:   No special needs at this time.  Additional Recommendations (Limitations, Scope, Preferences):  Full Scope Treatment  Psycho-social/Spiritual:   Desire for further Chaplaincy support:no  Additional Recommendations: Caregiving  Support/Resources and Education on Hospice  Prognosis:   Unable to determine, based on outcomes.  Discharge Planning: Anticipate home, unsure if home health services are needed      Primary Diagnoses: Present on Admission: . Pneumonia   I have reviewed the medical record, interviewed the patient and family, and examined the patient. The following aspects are pertinent.  Past Medical History:  Diagnosis Date  . Arthritis   . BPH (benign prostatic hyperplasia)   . Former smoker, stopped smoking many years ago   . History of needle biopsy of prostate with negative result 2013  . Hypertension   . Prostate enlargement   . Prostatic intraepithelial neoplasm III 2013   Social History   Socioeconomic History  . Marital status: Married    Spouse name: Not on file  . Number of children:  Not on file  . Years of education: Not on file  . Highest education level: Not on file  Occupational History  . Not on file  Social Needs  . Financial resource strain: Not on file  . Food insecurity    Worry: Not on file    Inability: Not on file  . Transportation needs    Medical: Not on file    Non-medical: Not on file  Tobacco Use  . Smoking status: Former Smoker    Quit date: 06/04/1980    Years since quitting: 38.9  . Smokeless tobacco: Never Used  Substance and Sexual Activity  . Alcohol use: No  . Drug use: No  . Sexual activity: Yes  Lifestyle  . Physical activity    Days per week: Not on file    Minutes per session: Not on file  . Stress: Not on file  Relationships  . Social Herbalist on phone: Not on file     Gets together: Not on file    Attends religious service: Not on file    Active member of club or organization: Not on file    Attends meetings of clubs or organizations: Not on file    Relationship status: Not on file  Other Topics Concern  . Not on file  Social History Narrative  . Not on file   Family History  Problem Relation Age of Onset  . Prostate cancer Father   . Cancer Father   . Prostate cancer Brother   . Cancer Mother    Scheduled Meds: . aspirin  81 mg Oral Daily  . doxazosin  4 mg Oral Daily  . enoxaparin (LOVENOX) injection  40 mg Subcutaneous Q24H  . finasteride  5 mg Oral Daily  . folic acid  1 mg Oral Daily  . methotrexate  10 mg Oral Weekly   Continuous Infusions: . sodium chloride 75 mL/hr at 05/25/19 1046  . azithromycin 500 mg (05/25/19 1333)  . cefTRIAXone (ROCEPHIN)  IV 2 g (05/25/19 1142)   PRN Meds:.benzonatate Medications Prior to Admission:  Prior to Admission medications   Medication Sig Start Date End Date Taking? Authorizing Provider  aspirin 81 MG chewable tablet Chew 1 tablet (81 mg total) by mouth 2 (two) times daily for 30 days. Patient taking differently: Chew 81 mg by mouth daily.  04/27/19 05/27/19 Yes Duanne Guess, PA-C  benzonatate (TESSALON) 200 MG capsule Take 200 mg by mouth 3 (three) times daily as needed for cough.   Yes [provider]  doxazosin (CARDURA) 4 MG tablet Take 1 tablet (4 mg total) by mouth daily. 08/09/18  Yes Stoioff, Ronda Fairly, MD  finasteride (PROSCAR) 5 MG tablet Take 1 tablet (5 mg total) by mouth daily. 08/09/18  Yes Stoioff, Ronda Fairly, MD  folic acid (FOLVITE) 1 MG tablet Take 1 mg by mouth daily. 05/11/19  Yes [provider]  levofloxacin (LEVAQUIN) 500 MG tablet Take 500 mg by mouth daily. 05/23/19 05/27/19 Yes [provider]  methotrexate 2.5 MG tablet Take 10 mg by mouth once a week. 05/11/19  Yes [provider]  docusate sodium (COLACE) 100 MG capsule Take 1 capsule (100 mg  total) by mouth 2 (two) times daily. Patient not taking: Reported on 05/25/2019 04/27/19   Duanne Guess, PA-C  ferrous [id number] C-folic acid (TRINSICON / FOLTRIN) capsule Take 1 capsule by mouth 2 (two) times daily. Patient not taking: Reported on 05/25/2019 04/28/19   Duanne Guess,  PA-C  HYDROcodone-acetaminophen (NORCO/VICODIN) 5-325 MG tablet Take 1-2 tablets by mouth every 4 (four) hours as needed for moderate pain (pain score 4-6). Patient not taking: Reported on 05/25/2019 04/27/19   Duanne Guess, PA-C   No Known Allergies Review of Systems  Unable to perform ROS: Acuity of condition    Physical Exam Vitals signs and nursing note reviewed.  Constitutional:      General: He is not in acute distress.    Appearance: Normal appearance. He is not ill-appearing.  HENT:     Head: Atraumatic.  Cardiovascular:     Rate and Rhythm: Normal rate.  Pulmonary:     Effort: Pulmonary effort is normal. No respiratory distress.  Abdominal:     General: Abdomen is flat. There is no distension.  Musculoskeletal:        General: No swelling or deformity.  Skin:    General: Skin is warm and dry.  Neurological:     Mental Status: He is alert and oriented to person, place, and time.  Psychiatric:        Mood and Affect: Mood normal.        Behavior: Behavior normal.     Vital Signs: BP 122/62 (BP Location: Right Arm)   Pulse 95   Temp 98.4 F (36.9 C) (Oral)   Resp (!) 27   Ht 5\' 8"  (1.727 m)   Wt 73.9 kg   SpO2 99%   BMI 24.78 kg/m  Pain Scale: 0-10 POSS *See Group Information*: 1-Acceptable,Awake and alert Pain Score: 0-No pain   SpO2: SpO2: 99 % O2 Device:SpO2: 99 % O2 Flow Rate: .   IO: Intake/output summary:   Intake/Output Summary (Last 24 hours) at 05/25/2019 1507 Last data filed at 05/25/2019 0645 Gross per 24 hour  Intake 93.22 ml  Output -  Net 93.22 ml    LBM: Last BM Date: 05/24/19 Baseline Weight: Weight: 73.9 kg Most recent weight:  Weight: 73.9 kg     Palliative Assessment/Data:   Flowsheet Rows     Most Recent Value  Intake Tab  Referral Department  Hospitalist  Unit at Time of Referral  Med/Surg Unit  Palliative Care Primary Diagnosis  Sepsis/Infectious Disease  Date Notified  05/25/19  Palliative Care Type  New Palliative care  Reason for referral  Clarify Goals of Care, End of Life Care Assistance  Date of Admission  05/25/19  Date first seen by Palliative Care  05/25/19  # of days Palliative referral response time  0 Day(s)  # of days IP prior to Palliative referral  0  Clinical Assessment  Palliative Performance Scale Score  50%  Pain Max last 24 hours  Not able to report  Pain Min Last 24 hours  Not able to report  Dyspnea Max Last 24 Hours  Not able to report  Dyspnea Min Last 24 hours  Not able to report  Psychosocial & Spiritual Assessment  Palliative Care Outcomes      Time In: 1510 Time Out: 1545 Time Total: 35 minutes  Greater than 50%  of this time was spent counseling and coordinating care related to the above assessment and plan.  Signed by: Drue Novel, NP   Please contact Palliative Medicine Team phone at [phone] for questions and concerns.  For individual provider: See Shea Evans

## 2019-05-25 NOTE — Consult Note (Signed)
Hematology/Oncology Consult note [facility] [phone] [phone]  Patient Care Team: Cyndie Chime, MD as PCP - General (Internal Medicine)   Name of the patient: [name] Foster  [id number]  1941/04/01   Date of visit: 05/25/19 REASON FOR COSULTATION:   History of presenting illness-  78 y.o. male with PMH listed at below who sent to ER via EMS for gradually worsening weakness for the past few weeks. Patient has had persistent dry cough and has been treated for pneumonia within the last week however patient subjectively feeling worse. Also had weight loss, fever of 101.5 recorded by EMS, chills, loss of appetite. COVID 19 has been tested which was negative.  Patient was last seen by me on 07/29/2018.  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  He was asymptomatic and supposed to follow-up with me again this September. Patient has a history of prostate intraepithelial neoplasm III in 2013 and he follows up with urology Dr. Bernardo Heater. Most recent PSA was done at [facility] on 05/23/2019 PSA level 1.45.  Patient was having a video conversation with her daughter [name] who request to participate in providing history and discussion today. Per daughter patient has recently started on methotrexate for arthritis. Patient follows up with Dr. 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 Recently started on methotrexate and per daughter patient has been not doing well ever since the start of methotrexate. Patient and daughter are not willing to continue on methotrexate.  Daughter plans for getting second rheumatology opinion.  Patient reports feeling slightly better since admission. Had a history of hip replacement on 04/26/2019, started on aspirin. Denies seeing any blood in stool or black tarry stool. He reports that last colonoscopy was about 10 years ago at a [facility]. 05/23/2019 iron panel shows iron 19, TIBC 194.6, iron saturation 10, ferritin 343.  Review of Systems  Constitutional: Positive for appetite change, fatigue and unexpected weight change. Negative for chills and fever.  HENT:   Negative for hearing loss and voice change.   Eyes: Negative for eye problems and icterus.  Respiratory: Positive for cough and shortness of breath. Negative for chest tightness.   Cardiovascular: Negative for chest pain and leg swelling.  Gastrointestinal: Negative for abdominal distention and abdominal pain.  Endocrine: Negative for hot flashes.  Genitourinary: Negative for difficulty urinating, dysuria and frequency.   Musculoskeletal: Negative for arthralgias.  Skin: Negative for itching and rash.  Neurological: Negative for light-headedness and numbness.  Hematological: Negative for adenopathy. Does not bruise/bleed easily.  Psychiatric/Behavioral: Negative for confusion.    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   Abnormal finding on chest xray 07/18/2013   Chronic cough 07/18/2013   Tests ordered 09/03/2012   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   Benign localized prostatic hyperplasia with lower urinary tract symptoms (LUTS) 07/07/2012   Elevated prostate specific antigen (PSA) 07/07/2012     Past Medical History:  Diagnosis Date   Arthritis    BPH (benign prostatic hyperplasia)    Former smoker, stopped smoking many years ago    History of needle biopsy of prostate with negative result 2013   Hypertension    Prostate enlargement  Prostatic intraepithelial neoplasm III 2013     Past Surgical History:  Procedure Laterality Date   HEMORROIDECTOMY       HERNIA REPAIR     JOINT REPLACEMENT Left    THR   PROSTATE BIOPSY  2013   TOTAL HIP ARTHROPLASTY Left    TOTAL HIP ARTHROPLASTY Right 04/26/2019   Procedure: TOTAL HIP ARTHROPLASTY ANTERIOR APPROACH;  Surgeon: Hessie Knows, MD;  Location: [facility] ORS;  Service: Orthopedics;  Laterality: Right;    Social History   Socioeconomic History   Marital status: Married    Spouse name: Not on file   Number of children: Not on file   Years of education: Not on file   Highest education level: Not on file  Occupational History   Not on file  Social Needs   Financial resource strain: Not on file   Food insecurity    Worry: Not on file    Inability: Not on file   Transportation needs    Medical: Not on file    Non-medical: Not on file  Tobacco Use   Smoking status: Former Smoker    Quit date: 06/04/1980    Years since quitting: 38.9   Smokeless tobacco: Never Used  Substance and Sexual Activity   Alcohol use: No   Drug use: No   Sexual activity: Yes  Lifestyle   Physical activity    Days per week: Not on file    Minutes per session: Not on file   Stress: Not on file  Relationships   Social connections    Talks on phone: Not on file    Gets together: Not on file    Attends religious service: Not on file    Active member of club or organization: Not on file    Attends meetings of clubs or organizations: Not on file    Relationship status: Not on file   Intimate partner violence    Fear of current or ex partner: Not on file    Emotionally abused: Not on file    Physically abused: Not on file    Forced sexual activity: Not on file  Other Topics Concern   Not on file  Social History Narrative   Not on file     Family History  Problem Relation Age of Onset   Prostate cancer Father    Cancer Father    Prostate cancer Brother    Cancer Mother      Current Facility-Administered Medications:    0.9 %  sodium chloride infusion, , Intravenous,  Continuous, Manuella Ghazi, Vipul, MD, Last Rate: 75 mL/hr at 05/25/19 1046   aspirin chewable tablet 81 mg, 81 mg, Oral, Daily, Manuella Ghazi, Vipul, MD, 81 mg at 05/25/19 1046   azithromycin (ZITHROMAX) 500 mg in sodium chloride 0.9 % 250 mL IVPB, 500 mg, Intravenous, Q24H, Manuella Ghazi, Vipul, MD, Last Rate: 250 mL/hr at 05/25/19 1333, 500 mg at 05/25/19 1333   benzonatate (TESSALON) capsule 200 mg, 200 mg, Oral, TID PRN, Max Sane, MD   cefTRIAXone (ROCEPHIN) 2 g in sodium chloride 0.9 % 100 mL IVPB, 2 g, Intravenous, Q24H, Manuella Ghazi, Vipul, MD, Last Rate: 200 mL/hr at 05/25/19 1142, 2 g at 05/25/19 1142   doxazosin (CARDURA) tablet 4 mg, 4 mg, Oral, Daily, Manuella Ghazi, Vipul, MD, 4 mg at 05/25/19 1046   enoxaparin (LOVENOX) injection 40 mg, 40 mg, Subcutaneous, Q24H, Manuella Ghazi, Vipul, MD   finasteride (PROSCAR) tablet 5 mg, 5 mg, Oral, Daily, Manuella Ghazi, Vipul, MD, 5 mg at 05/25/19 1046  folic acid (FOLVITE) tablet 1 mg, 1 mg, Oral, Daily, Manuella Ghazi, Vipul, MD, 1 mg at 05/25/19 1046   methotrexate (RHEUMATREX) tablet 10 mg, 10 mg, Oral, Weekly, Max Sane, MD   Physical exam:  Vitals:   05/25/19 0800 05/25/19 0830 05/25/19 0900 05/25/19 0953  BP: 107/67 119/64 122/66 122/62  Pulse: 99 88 93 95  Resp: (!) 24 20 (!) 27   Temp:    98.4 F (36.9 C)  TempSrc:    Oral  SpO2: 97% 96% 98% 99%  Weight:      Height:       Physical Exam  Constitutional: He is oriented to person, place, and time. No distress.  Lying in bed.  HENT:  Head: Normocephalic and atraumatic.  Mouth/Throat: No oropharyngeal exudate.  Eyes: Pupils are equal, round, and reactive to light. EOM are normal. No scleral icterus.  Neck: Normal range of motion. Neck supple.  Cardiovascular: Normal rate and regular rhythm.  No murmur heard. Pulmonary/Chest: Effort normal and breath sounds normal. No respiratory distress. He has no rales.  Abdominal: Soft. He exhibits no distension. There is no abdominal tenderness.  Musculoskeletal: Normal range of motion.         General: No edema.  Neurological: He is alert and oriented to person, place, and time.  Skin: Skin is warm. He is not diaphoretic.        CMP Latest Ref Rng & Units 05/25/2019  Glucose 70 - 99 mg/dL -  BUN 8 - 23 mg/dL -  Creatinine 0.61 - 1.24 mg/dL 0.64  Sodium 135 - 145 mmol/L -  Potassium 3.5 - 5.1 mmol/L -  Chloride 98 - 111 mmol/L -  CO2 22 - 32 mmol/L -  Calcium 8.9 - 10.3 mg/dL -  Total Protein 6.5 - 8.1 g/dL -  Total Bilirubin 0.3 - 1.2 mg/dL -  Alkaline Phos 38 - 126 U/L -  AST 15 - 41 U/L -  ALT 0 - 44 U/L -   CBC Latest Ref Rng & Units 05/25/2019  WBC 4.0 - 10.5 K/uL 8.9  Hemoglobin 13.0 - 17.0 g/dL 8.9(L)  Hematocrit 39.0 - 52.0 % 28.3(L)  Platelets 150 - 400 K/uL 508(H)   RADIOGRAPHIC STUDIES: I have personally reviewed the radiological images as listed and agreed with the findings in the report.  Ct Chest W Contrast  Result Date: 05/25/2019 CLINICAL DATA:  Chronic dyspnea.  Fever. EXAM: CT CHEST, ABDOMEN, AND PELVIS WITH CONTRAST TECHNIQUE: Multidetector CT imaging of the chest, abdomen and pelvis was performed following the standard protocol during bolus administration of intravenous contrast. CONTRAST:  168mL OMNIPAQUE IOHEXOL 300 MG/ML  SOLN COMPARISON:  Chest CT from 9 days ago. Abdomen and pelvis CT 06/25/2010 FINDINGS: CT CHEST FINDINGS Cardiovascular: Normal heart size. Trace pericardial fluid. No acute vascular finding. Mediastinum/Nodes: Stable prominent bilateral hilar lymph nodes which are symmetric and noncalcified. Lungs/Pleura: Unchanged airway thickening with asymmetric right-sided pulmonary reticulation with a mild subpleural predilection. This could reflect atypical infection, sequela of aspiration, or inflammatory pneumonia. No acute airspace disease, edema, effusion, honeycombing, or pneumothorax. Musculoskeletal: Degenerative disease without acute finding CT ABDOMEN PELVIS FINDINGS Hepatobiliary: No focal liver abnormality.No evidence of biliary  obstruction or stone. Pancreas: Unremarkable. Spleen: Unremarkable. Adrenals/Urinary Tract: 11 mm left adrenal adenoma that remain size stable. 6 mm left lower pole renal calculus. No hydronephrosis or ureteral stone. Limited assessment of the bladder due to streak artifact. Tiny right renal cystic density. Stomach/Bowel:  No obstruction. No evidence of bowel inflammation Vascular/Lymphatic:  Mild atherosclerosis. Prominent peripancreatic arterial arcade with tortuous hepatic artery, stable. No underlying obstructive process is seen. No mass or adenopathy. Reproductive:Pelvic structures are largely obscured by hip prostheses. Other: No ascites or pneumoperitoneum. Musculoskeletal: Advanced lower lumbar facet and disc degeneration with multilevel listhesis and scoliosis. IMPRESSION: Chest CT: 1. No acute finding or change from 05/16/2019. 2. Asymmetric reticulation in the right lung as described previously, please see follow-up recommendations at that time. Abdominal CT: 1. No emergent finding. 2. Left nephrolithiasis and small left adrenal adenoma. Electronically Signed   By: Monte Fantasia M.D.   On: 05/25/2019 05:22   Ct Angio Chest Pe W And/or Wo Contrast  Result Date: 05/16/2019 CLINICAL DATA:  PE suspected, positive D-dimer EXAM: CT ANGIOGRAPHY CHEST WITH CONTRAST TECHNIQUE: Multidetector CT imaging of the chest was performed using the standard protocol during bolus administration of intravenous contrast. Multiplanar CT image reconstructions and MIPs were obtained to evaluate the vascular anatomy. CONTRAST:  2mL OMNIPAQUE IOHEXOL 350 MG/ML SOLN COMPARISON:  None. FINDINGS: Cardiovascular: Examination is mildly limited by motion artifact and marginal contrast bolus for the evaluation of pulmonary embolism, main pulmonary artery = 195 HU. Within this limitation, no evidence of pulmonary embolism through the segmental pulmonary arterial level. Normal heart size. No pericardial effusion. Mediastinum/Nodes:  Prominent bilateral axillary lymph nodes. Prominent although not pathologically enlarged mediastinal and hilar lymph nodes thyroid gland, trachea, and esophagus demonstrate no significant findings. Lungs/Pleura: There is diffuse bilateral bronchial wall thickening, most conspicuous in the right lower lobe, with extensive, predominantly right-sided subpleural ground-glass and irregular pulmonary opacity nodularity. Scarring and/or atelectasis of the dependent bilateral lung bases. No pleural effusion or pneumothorax. Upper Abdomen: No acute abnormality. Musculoskeletal: No chest wall abnormality. No acute or significant osseous findings. Review of the MIP images confirms the above findings. IMPRESSION: 1. Examination is mildly limited by motion artifact and marginal contrast bolus for the evaluation of pulmonary embolism, main pulmonary artery = 195 HU. Within this limitation, no evidence of pulmonary embolism through the segmental pulmonary arterial level. 2. There is diffuse bilateral bronchial wall thickening, most conspicuous in the right lower lobe, with extensive, predominantly right-sided subpleural ground-glass and irregular pulmonary opacity nodularity. Scarring and/or atelectasis of the dependent bilateral lung bases. Generally suspect atypical infection and/or chronic sequelae, such as aspiration, however mild pulmonary fibrosis is not excluded. Consider follow-up ILD protocol CT in 6-12 months if clinically appropriate. Electronically Signed   By: Eddie Candle M.D.   On: 05/16/2019 18:45   Ct Abdomen Pelvis W Contrast  Result Date: 05/25/2019 CLINICAL DATA:  Chronic dyspnea.  Fever. EXAM: CT CHEST, ABDOMEN, AND PELVIS WITH CONTRAST TECHNIQUE: Multidetector CT imaging of the chest, abdomen and pelvis was performed following the standard protocol during bolus administration of intravenous contrast. CONTRAST:  150mL OMNIPAQUE IOHEXOL 300 MG/ML  SOLN COMPARISON:  Chest CT from 9 days ago. Abdomen and  pelvis CT 06/25/2010 FINDINGS: CT CHEST FINDINGS Cardiovascular: Normal heart size. Trace pericardial fluid. No acute vascular finding. Mediastinum/Nodes: Stable prominent bilateral hilar lymph nodes which are symmetric and noncalcified. Lungs/Pleura: Unchanged airway thickening with asymmetric right-sided pulmonary reticulation with a mild subpleural predilection. This could reflect atypical infection, sequela of aspiration, or inflammatory pneumonia. No acute airspace disease, edema, effusion, honeycombing, or pneumothorax. Musculoskeletal: Degenerative disease without acute finding CT ABDOMEN PELVIS FINDINGS Hepatobiliary: No focal liver abnormality.No evidence of biliary obstruction or stone. Pancreas: Unremarkable. Spleen: Unremarkable. Adrenals/Urinary Tract: 11 mm left adrenal adenoma that remain size stable. 6 mm left lower pole renal calculus.  No hydronephrosis or ureteral stone. Limited assessment of the bladder due to streak artifact. Tiny right renal cystic density. Stomach/Bowel:  No obstruction. No evidence of bowel inflammation Vascular/Lymphatic: Mild atherosclerosis. Prominent peripancreatic arterial arcade with tortuous hepatic artery, stable. No underlying obstructive process is seen. No mass or adenopathy. Reproductive:Pelvic structures are largely obscured by hip prostheses. Other: No ascites or pneumoperitoneum. Musculoskeletal: Advanced lower lumbar facet and disc degeneration with multilevel listhesis and scoliosis. IMPRESSION: Chest CT: 1. No acute finding or change from 05/16/2019. 2. Asymmetric reticulation in the right lung as described previously, please see follow-up recommendations at that time. Abdominal CT: 1. No emergent finding. 2. Left nephrolithiasis and small left adrenal adenoma. Electronically Signed   By: Monte Fantasia M.D.   On: 05/25/2019 05:22   Dg Chest Portable 1 View  Result Date: 05/25/2019 CLINICAL DATA:  Fever cough and weakness EXAM: PORTABLE CHEST 1 VIEW  COMPARISON:  07/12/2013 FINDINGS: The heart size and mediastinal contours are within normal limits. Both lungs are clear. The visualized skeletal structures are unremarkable. IMPRESSION: No active disease. Electronically Signed   By: Donavan Foil M.D.   On: 05/25/2019 03:05   Dg Hip Operative Unilat W Or W/o Pelvis Right  Result Date: 04/26/2019 CLINICAL DATA:  Primary osteoarthritis of the right hip. Right total hip arthroplasty. EXAM: OPERATIVE RIGHT HIP (WITH PELVIS IF PERFORMED)  VIEWS TECHNIQUE: Fluoroscopic spot image(s) were submitted for interpretation post-operatively. COMPARISON:  None. FINDINGS: Ap C-arm images before and after hip arthroplasty demonstrate that the acetabular component appears in good position. The visualized portion of the femoral component also appears in good position. IMPRESSION: Right total hip arthroplasty as described. Electronically Signed   By: Lorriane Shire M.D.   On: 04/26/2019 11:31   Dg Hip Unilat W Or W/o Pelvis 2-3 Views Right  Result Date: 04/26/2019 CLINICAL DATA:  Post RIGHT hip arthroplasty EXAM: DG HIP (WITH OR WITHOUT PELVIS) 2-3V RIGHT COMPARISON:  None FINDINGS: Components of a RIGHT hip prosthesis are identified. Overlying skin clips and postsurgical soft tissue changes. No fracture, dislocation, or bone destruction. IMPRESSION: RIGHT hip prosthesis without acute complication. Electronically Signed   By: Lavonia Dana M.D.   On: 04/26/2019 09:50    Assessment and plan- Patient is a 78 y.o. male with history of autoimmune/ethnic neutropenia, arthritis who was recently started on methotrexate, recent hip replacement presents with fever, chills, cough generalized weakness.  #Sepsis due to pneumonia, he has been started on antibiotics.  Increased pro calcitonin level.  Viral panel pending. #History of autoimmune/ethnic neutropenia, 05/24/2019 ANC 7.9.  He is able to mount adequate acute response to infection. #Anemia, hemoglobin 8.9, this is a lot  decreased compared to his baseline. Previous iron panel reviewed, mixed picture with decreased iron saturation, high ferritin which can be falsely elevated due to recent surgery. Would not repeat another iron panel as acute inflammation/infection can cause falsely increased ferritin level. Retic panel shows decreased reticulocyte hemoglobin indicating underlying iron deficiency. We discussed about starting oral iron supplementation with ferrous sulfate 325 mg twice daily.  We also discussed about options of IV iron if he does not respond to oral iron supplementation. No active GI bleeding.  Recommend patient to reestablish care with gastroenterology outpatient for repeat colonoscopy.  I discontinued the methotrexate from inpatient medication list as patient and his daughter are not willing to get additional methotrexate treatments.  Recommend patient to be discussed with rheumatology.  #History of prostate intraepithelial neoplasm III, followed by urology Dr. Bernardo Heater.  Last PSA was done in July 2020 was 1.45.   #Diffuse bilateral bronchial wall thickening, questionable atypical infection versus fibrosis.  Defer to primary team to manage.  Pulmonology opinion in the future.  Plan was communicated with Dr. Manuella Ghazi via secure check.  Thank you for allowing me to participate in the care of this patient.  Total face to face encounter time for this patient visit was 70 min. >50% of the time was  spent in counseling and coordination of care.    Earlie Server, MD, PhD Hematology Oncology [facility] Pager- [phone] 05/25/2019

## 2019-05-25 NOTE — ED Notes (Signed)
Patient transported to CT 

## 2019-05-25 NOTE — H&P (Signed)
[facility] NAME: [name] Foster    MR#:  [id number]  DATE OF BIRTH:  04/07/1941  DATE OF ADMISSION:  05/25/2019  PRIMARY CARE PHYSICIAN: Cyndie Chime, MD   REQUESTING/REFERRING PHYSICIAN: Hinda Kehr, MD  CHIEF COMPLAINT:   Chief Complaint  Patient presents with   Weakness    HISTORY OF PRESENT ILLNESS:  [name] Foster  is a 78 y.o. male with a known history of prostate cancer who was recently started on methotrexate.  He presents by EMS from his facility for gradually worsening weakness for multiple weeks.  He has had a persistent dry cough and has been treated for pneumonia within the last week but he is getting worse.  He has had weight loss, fever as high as 101.5 recorded tonight by EMS, chills, and loss of appetite. He says he has been tested twice previously for COVID-19 and both tests have been negative.  His symptoms are severe and have been gradual in onset and nothing particular makes things better or worse. PAST MEDICAL HISTORY:   Past Medical History:  Diagnosis Date   Arthritis    BPH (benign prostatic hyperplasia)    Former smoker, stopped smoking many years ago    History of needle biopsy of prostate with negative result 2013   Hypertension    Prostate enlargement    Prostatic intraepithelial neoplasm III 2013    PAST SURGICAL HISTORY:   Past Surgical History:  Procedure Laterality Date   HEMORROIDECTOMY     HERNIA REPAIR     JOINT REPLACEMENT Left    THR   PROSTATE BIOPSY  2013   TOTAL HIP ARTHROPLASTY Left    TOTAL HIP ARTHROPLASTY Right 04/26/2019   Procedure: TOTAL HIP ARTHROPLASTY ANTERIOR APPROACH;  Surgeon: Hessie Knows, MD;  Location: [facility] OR[facility];  Service: Orthopedics;  Laterality: Right;    SOCIAL HISTORY:   Social History   Tobacco Use   Smoking status: Former Smoker    Quit date: 06/04/1980    Years since quitting: 38.9   Smokeless tobacco: Never Used    Substance Use Topics   Alcohol use: No    FAMILY HISTORY:   Family History  Problem Relation Age of Onset   Prostate cancer Father    Cancer Father    Prostate cancer Brother    Cancer Mother     DRUG ALLERGIES:  No Known Allergies  REVIEW OF SYSTEMS:   Review of Systems  Constitutional: Positive for chills, fever, malaise/fatigue and weight loss. Negative for diaphoresis.  HENT: Negative for ear discharge, ear pain, hearing loss, nosebleeds, sore throat and tinnitus.   Eyes: Negative for blurred vision and pain.  Respiratory: Positive for cough. Negative for hemoptysis, shortness of breath and wheezing.   Cardiovascular: Negative for chest pain, palpitations, orthopnea and leg swelling.  Gastrointestinal: Negative for abdominal pain, blood in stool, constipation, diarrhea, heartburn, nausea and vomiting.  Genitourinary: Negative for dysuria, frequency and urgency.  Musculoskeletal: Negative for back pain and myalgias.  Skin: Negative for itching and rash.  Neurological: Negative for dizziness, tingling, tremors, focal weakness, seizures, weakness and headaches.  Psychiatric/Behavioral: Negative for depression. The patient is not nervous/anxious.    MEDICATIONS AT HOME:   Prior to Admission medications   Medication Sig Start Date End Date Taking? Authorizing Provider  benzonatate (TESSALON) 200 MG capsule Take 200 mg by mouth 3 (three) times daily as needed for cough.   Yes [provider]  doxazosin (CARDURA)  4 MG tablet Take 1 tablet (4 mg total) by mouth daily. 08/09/18  Yes Stoioff, Ronda Fairly, MD  finasteride (PROSCAR) 5 MG tablet Take 1 tablet (5 mg total) by mouth daily. 08/09/18  Yes Stoioff, Ronda Fairly, MD  folic acid (FOLVITE) 1 MG tablet Take 1 mg by mouth daily. 05/11/19  Yes [provider]  levofloxacin (LEVAQUIN) 500 MG tablet Take 500 mg by mouth daily. 05/23/19 05/27/19 Yes [provider]  methotrexate 2.5 MG tablet Take 10 mg by mouth  once a week. 05/11/19  Yes [provider]  aspirin 81 MG chewable tablet Chew 1 tablet (81 mg total) by mouth 2 (two) times daily for 30 days. 04/27/19 05/27/19  Duanne Guess, PA-C  docusate sodium (COLACE) 100 MG capsule Take 1 capsule (100 mg total) by mouth 2 (two) times daily. Patient not taking: Reported on 05/25/2019 04/27/19   Duanne Guess, PA-C  ferrous [id number] C-folic acid (TRINSICON / FOLTRIN) capsule Take 1 capsule by mouth 2 (two) times daily. Patient not taking: Reported on 05/25/2019 04/28/19   Duanne Guess, PA-C  HYDROcodone-acetaminophen (NORCO/VICODIN) 5-325 MG tablet Take 1-2 tablets by mouth every 4 (four) hours as needed for moderate pain (pain score 4-6). Patient not taking: Reported on 05/25/2019 04/27/19   Duanne Guess, PA-C      VITAL SIGNS:  Blood pressure 105/68, pulse 91, temperature 100.3 F (37.9 C), temperature source Oral, resp. rate (!) 23, height 5\' 8"  (1.727 m), weight 73.9 kg, SpO2 97 %. PHYSICAL EXAMINATION:  Physical Exam  GENERAL:  78 y.o.-year-old patient lying in the bed with no acute distress.  EYES: Pupils equal, round, reactive to light and accommodation. No scleral icterus. Extraocular muscles intact.  HEENT: Head atraumatic, normocephalic. Oropharynx and nasopharynx clear.  NECK:  Supple, no jugular venous distention. No thyroid enlargement, no tenderness.  LUNGS: Normal breath sounds bilaterally, no wheezing, rales,rhonchi or crepitation. No use of accessory muscles of respiration.  CARDIOVASCULAR: S1, S2 normal. No murmurs, rubs, or gallops.  ABDOMEN: Soft, nontender, nondistended. Bowel sounds present. No organomegaly or mass.  EXTREMITIES: No pedal edema, cyanosis, or clubbing.  NEUROLOGIC: Cranial nerves II through XII are intact. Muscle strength 5/5 in all extremities. Sensation intact. Gait not checked.  PSYCHIATRIC: The patient is alert and oriented x 3.  SKIN: No obvious rash, lesion, or ulcer.  LABORATORY  PANEL:   CBC Recent Labs  Lab 05/24/19 2157  WBC 11.0*  HGB 9.0*  HCT 28.3*  PLT 575*   ------------------------------------------------------------------------------------------------------------------  Chemistries  Recent Labs  Lab 05/24/19 2157  NA 130*  K 4.1  CL 94*  CO2 28  GLUCOSE 173*  BUN 16  CREATININE 0.79  CALCIUM 8.0*  AST 26  ALT 27  ALKPHOS 258*  BILITOT 0.8   ------------------------------------------------------------------------------------------------------------------  Cardiac Enzymes No results for input(s): TROPONINI in the last 168 hours. ------------------------------------------------------------------------------------------------------------------  RADIOLOGY:  Ct Chest W Contrast  Result Date: 05/25/2019 CLINICAL DATA:  Chronic dyspnea.  Fever. EXAM: CT CHEST, ABDOMEN, AND PELVIS WITH CONTRAST TECHNIQUE: Multidetector CT imaging of the chest, abdomen and pelvis was performed following the standard protocol during bolus administration of intravenous contrast. CONTRAST:  155mL OMNIPAQUE IOHEXOL 300 MG/ML  SOLN COMPARISON:  Chest CT from 9 days ago. Abdomen and pelvis CT 06/25/2010 FINDINGS: CT CHEST FINDINGS Cardiovascular: Normal heart size. Trace pericardial fluid. No acute vascular finding. Mediastinum/Nodes: Stable prominent bilateral hilar lymph nodes which are symmetric and noncalcified. Lungs/Pleura: Unchanged airway thickening with asymmetric right-sided pulmonary reticulation  with a mild subpleural predilection. This could reflect atypical infection, sequela of aspiration, or inflammatory pneumonia. No acute airspace disease, edema, effusion, honeycombing, or pneumothorax. Musculoskeletal: Degenerative disease without acute finding CT ABDOMEN PELVIS FINDINGS Hepatobiliary: No focal liver abnormality.No evidence of biliary obstruction or stone. Pancreas: Unremarkable. Spleen: Unremarkable. Adrenals/Urinary Tract: 11 mm left adrenal adenoma that  remain size stable. 6 mm left lower pole renal calculus. No hydronephrosis or ureteral stone. Limited assessment of the bladder due to streak artifact. Tiny right renal cystic density. Stomach/Bowel:  No obstruction. No evidence of bowel inflammation Vascular/Lymphatic: Mild atherosclerosis. Prominent peripancreatic arterial arcade with tortuous hepatic artery, stable. No underlying obstructive process is seen. No mass or adenopathy. Reproductive:Pelvic structures are largely obscured by hip prostheses. Other: No ascites or pneumoperitoneum. Musculoskeletal: Advanced lower lumbar facet and disc degeneration with multilevel listhesis and scoliosis. IMPRESSION: Chest CT: 1. No acute finding or change from 05/16/2019. 2. Asymmetric reticulation in the right lung as described previously, please see follow-up recommendations at that time. Abdominal CT: 1. No emergent finding. 2. Left nephrolithiasis and small left adrenal adenoma. Electronically Signed   By: Monte Fantasia M.D.   On: 05/25/2019 05:22   Ct Abdomen Pelvis W Contrast  Result Date: 05/25/2019 CLINICAL DATA:  Chronic dyspnea.  Fever. EXAM: CT CHEST, ABDOMEN, AND PELVIS WITH CONTRAST TECHNIQUE: Multidetector CT imaging of the chest, abdomen and pelvis was performed following the standard protocol during bolus administration of intravenous contrast. CONTRAST:  180mL OMNIPAQUE IOHEXOL 300 MG/ML  SOLN COMPARISON:  Chest CT from 9 days ago. Abdomen and pelvis CT 06/25/2010 FINDINGS: CT CHEST FINDINGS Cardiovascular: Normal heart size. Trace pericardial fluid. No acute vascular finding. Mediastinum/Nodes: Stable prominent bilateral hilar lymph nodes which are symmetric and noncalcified. Lungs/Pleura: Unchanged airway thickening with asymmetric right-sided pulmonary reticulation with a mild subpleural predilection. This could reflect atypical infection, sequela of aspiration, or inflammatory pneumonia. No acute airspace disease, edema, effusion, honeycombing,  or pneumothorax. Musculoskeletal: Degenerative disease without acute finding CT ABDOMEN PELVIS FINDINGS Hepatobiliary: No focal liver abnormality.No evidence of biliary obstruction or stone. Pancreas: Unremarkable. Spleen: Unremarkable. Adrenals/Urinary Tract: 11 mm left adrenal adenoma that remain size stable. 6 mm left lower pole renal calculus. No hydronephrosis or ureteral stone. Limited assessment of the bladder due to streak artifact. Tiny right renal cystic density. Stomach/Bowel:  No obstruction. No evidence of bowel inflammation Vascular/Lymphatic: Mild atherosclerosis. Prominent peripancreatic arterial arcade with tortuous hepatic artery, stable. No underlying obstructive process is seen. No mass or adenopathy. Reproductive:Pelvic structures are largely obscured by hip prostheses. Other: No ascites or pneumoperitoneum. Musculoskeletal: Advanced lower lumbar facet and disc degeneration with multilevel listhesis and scoliosis. IMPRESSION: Chest CT: 1. No acute finding or change from 05/16/2019. 2. Asymmetric reticulation in the right lung as described previously, please see follow-up recommendations at that time. Abdominal CT: 1. No emergent finding. 2. Left nephrolithiasis and small left adrenal adenoma. Electronically Signed   By: Monte Fantasia M.D.   On: 05/25/2019 05:22   Dg Chest Portable 1 View  Result Date: 05/25/2019 CLINICAL DATA:  Fever cough and weakness EXAM: PORTABLE CHEST 1 VIEW COMPARISON:  07/12/2013 FINDINGS: The heart size and mediastinal contours are within normal limits. Both lungs are clear. The visualized skeletal structures are unremarkable. IMPRESSION: No active disease. Electronically Signed   By: Donavan Foil M.D.   On: 05/25/2019 03:05   IMPRESSION AND PLAN:  74 y m admitted for sepsis due to pna  * Sepsis: present on admission likely due to pneumonia   *  Pneumonia: start Abx, await c/s results - could be viral in nature, check resp virus including influenza  *  Hyponatremia: likely due to dehydration. IVFs for now.  * Prostate CA: f/by Dr Tasia Catchings. Will c/s her  * Recent Rt Hip Arthroplasty: June 2020 - was D/C home with [facility] then   All the records are reviewed and case discussed with ED provider. Management plans discussed with the patient, nursing and they are in agreement.  CODE STATUS: FULL CODE  TOTAL TIME TAKING CARE OF THIS PATIENT: 45 minutes.    Max Sane M.D on 05/25/2019 at 7:11 AM  Between 7am to 6pm - Pager - [phone]  After 6pm go to [email or web address] - Proofreader  [facility]  Office  [phone]  CC: Primary care physician; Cyndie Chime, MD   Note: This dictation was prepared with Dragon dictation along with smaller phrase technology. Any transcriptional errors that result from this process are unintentional.

## 2019-05-25 NOTE — ED Notes (Signed)
Pt being transported to rm [id number] at this time via stretcher.

## 2019-05-25 NOTE — Progress Notes (Signed)
Pt arrived from the ED at 0930. Pt was A&Ox4. VSS. Pt had no c/o pain. Orders reviewed, acknowledged,a dn initated. Care plan and education initiated. Pt instructed not to get OOB without assistance. Pt verbalized understanding. Pt oriented to room and bedside equipment. Bed in lowest position, bed alarm is on, and the call bell is within reach. Skin is intact. Pt has scar on right hip from previous hip surgery. Skin assessment verified by Sheliah Hatch, RN.

## 2019-05-25 NOTE — ED Notes (Signed)
pts family updated at this time

## 2019-05-26 LAB — URINE CULTURE
Culture: NO GROWTH
Special Requests: NORMAL

## 2019-05-26 LAB — PROTEIN / CREATININE RATIO, URINE
Creatinine, Urine: 99 mg/dL
Protein Creatinine Ratio: 0.43 mg/mg{Cre} — ABNORMAL HIGH (ref 0.00–0.15)
Total Protein, Urine: 43 mg/dL

## 2019-05-26 LAB — LEGIONELLA PNEUMOPHILA SEROGP 1 UR AG: L. pneumophila Serogp 1 Ur Ag: NEGATIVE

## 2019-05-26 LAB — HIV ANTIBODY (ROUTINE TESTING W REFLEX): HIV Screen 4th Generation wRfx: NONREACTIVE

## 2019-05-26 LAB — C-REACTIVE PROTEIN: CRP: 24 mg/dL — ABNORMAL HIGH (ref <1.0)

## 2019-05-26 LAB — CK: Total CK: 18 U/L — ABNORMAL LOW (ref 49–397)

## 2019-05-26 LAB — TSH: TSH: 4.289 u[IU]/mL (ref 0.350–4.500)

## 2019-05-26 LAB — PROCALCITONIN: Procalcitonin: 0.19 ng/mL

## 2019-05-26 LAB — SEDIMENTATION RATE: Sed Rate: 65 mm/hr — ABNORMAL HIGH (ref 0–20)

## 2019-05-26 LAB — CORTISOL-AM, BLOOD: Cortisol - AM: 14.4 ug/dL (ref 6.7–22.6)

## 2019-05-26 MED ORDER — AZITHROMYCIN 250 MG PO TABS
500.0000 mg | ORAL_TABLET | Freq: Every day | ORAL | Status: DC
  Administered 2019-05-27: 500 mg via ORAL
  Filled 2019-05-26: qty 2

## 2019-05-26 NOTE — Progress Notes (Signed)
[facility] [place] NAME: Traxton Kolenda    MR#:  [id number]  DATE OF BIRTH:  1940/12/19  SUBJECTIVE:  CHIEF COMPLAINT:   Chief Complaint  Patient presents with  . Weakness  feels better, weak REVIEW OF SYSTEMS:  Review of Systems  Constitutional: Positive for malaise/fatigue. Negative for diaphoresis, fever and weight loss.  HENT: Negative for ear discharge, ear pain, hearing loss, nosebleeds, sore throat and tinnitus.   Eyes: Negative for blurred vision and pain.  Respiratory: Negative for cough, hemoptysis, shortness of breath and wheezing.   Cardiovascular: Negative for chest pain, palpitations, orthopnea and leg swelling.  Gastrointestinal: Negative for abdominal pain, blood in stool, constipation, diarrhea, heartburn, nausea and vomiting.  Genitourinary: Negative for dysuria, frequency and urgency.  Musculoskeletal: Negative for back pain and myalgias.  Skin: Negative for itching and rash.  Neurological: Negative for dizziness, tingling, tremors, focal weakness, seizures, weakness and headaches.  Psychiatric/Behavioral: Negative for depression. The patient is not nervous/anxious.     DRUG ALLERGIES:  No Known Allergies VITALS:  Blood pressure 113/64, pulse 95, temperature 98.5 F (36.9 C), temperature source Oral, resp. rate 18, height 5\' 8"  (1.727 m), weight 74.4 kg, SpO2 99 %. PHYSICAL EXAMINATION:  Physical Exam HENT:     Head: Normocephalic and atraumatic.  Eyes:     Conjunctiva/sclera: Conjunctivae normal.     Pupils: Pupils are equal, round, and reactive to light.  Neck:     Musculoskeletal: Normal range of motion and neck supple.     Thyroid: No thyromegaly.     Trachea: No tracheal deviation.  Cardiovascular:     Rate and Rhythm: Normal rate and regular rhythm.     Heart sounds: Normal heart sounds.  Pulmonary:     Effort: Pulmonary effort is normal. No respiratory distress.     Breath sounds: Normal breath  sounds. No wheezing.  Chest:     Chest wall: No tenderness.  Abdominal:     General: Bowel sounds are normal. There is no distension.     Palpations: Abdomen is soft.     Tenderness: There is no abdominal tenderness.  Musculoskeletal: Normal range of motion.  Skin:    General: Skin is warm and dry.     Findings: No rash.  Neurological:     Mental Status: He is alert and oriented to person, place, and time.     Cranial Nerves: No cranial nerve deficit.    LABORATORY PANEL:  Male CBC Recent Labs  Lab 05/25/19 1037  WBC 8.9  HGB 8.9*  HCT 28.3*  PLT 508*   ------------------------------------------------------------------------------------------------------------------ Chemistries  Recent Labs  Lab 05/24/19 2157 05/25/19 1037  NA 130*  --   K 4.1  --   CL 94*  --   CO2 28  --   GLUCOSE 173*  --   BUN 16  --   CREATININE 0.79 0.64  CALCIUM 8.0*  --   AST 26  --   ALT 27  --   ALKPHOS 258*  --   BILITOT 0.8  --    RADIOLOGY:  No results found. ASSESSMENT AND PLAN:  17 y m admitted for sepsis due to pna  * Sepsis: present on admission likely due to pneumonia   * Pneumonia: empiric Abx - likely viral in nature, resp resp virus including influenza neg, check procalcitonin  * Hyponatremia: likely due to dehydration. IVFs for now.  * Prostate CA: f/by Urology.   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 Management plans discussed with the patient, Rheum, nursing and they are in agreement.  CODE STATUS: Full Code  TOTAL TIME TAKING CARE OF THIS PATIENT: 35 minutes.   More than 50% of the time was spent in counseling/coordination of care: YES   POSSIBLE D/C IN 1-2 DAYS, DEPENDING ON CLINICAL CONDITION.   Max Sane M.D on 05/26/2019 at 4:13 PM  Between 7am to 6pm - Pager - [phone]  After 6pm go to [email or web address] - Proofreader  [facility]  Office  [phone]  CC: Primary care physician; Cyndie Chime, MD  Note: This dictation was prepared with Dragon dictation along with smaller phrase technology. Any transcriptional errors that result from this process are unintentional.

## 2019-05-26 NOTE — Progress Notes (Signed)
Palliative: [name] Foster is sitting up on the edge of the bed.  He greets me making and keeping eye contact.  He is calm and cooperative, alert and oriented.  There is no family at bedside at this time, due to visitor restrictions.  We briefly review his latest test results.  He has no questions related to treatment plan or our palliative discussion yesterday.  He appears well-nourished and developed.  No further needs at this time.  Plan:  Home with [facility] if needed.    33 minutes [name] Axe, NP Palliative Medicine Team Team Phone # [phone] Greater than 50% of this time was spent counseling and coordinating care related to the above assessment and plan.

## 2019-05-26 NOTE — Consult Note (Signed)
Reason for Consult: Rheumatoid arthritis  Referring Physician: Hospitalist  [name] Foster   HPI: Complicated story.   78 year old African-American male.  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 Known osteoarthritis.  Prior left hip replacement  Had neutropenia 2018.  Evaluated by hematology.  Thought to be immune with white count of 2800 and positive antineutrophil antibody.  Flow studies at that time revealed 10% LGL.  Was followed.  No meds.  No elevated infections  Prostate cancer.  Biopsy.  Being watched.  No treatment.  Recent PSA by patient's report 1.4  This spring he had trouble with the shoulder.  Prior injection.  X-ray showed osteoarthritis.  Then had pain in the right hip.  X-ray showed osteoarthritis.  Sed rate was 60 on February 07, 2019.  He was seen again in the office for left shoulder pain.    In May he was seen for bilateral knee pain left shoulder pain.  Hands had swelled some at that time.  In walk-in clinic.  White count 6800.  Sed rate greater than 100.  CRP 244.  Rheumatoid factor 97.  CCP antibodies negative.  X-rays of the knee showed osteoarthritis. He was seen by Dr. Meda Coffee.  Rheumatoid arthritis.  Did not have a lot of synovitis at that time as he had been on prednisone.  Had right hip operated.  Went to PT.  Able to ambulate with a cane  Came back to rheumatology.  Was started on methotrexate.  4 pills a week.    Was seen in the walk-in clinic.  Cough.  White count 10,000.  Treated with azithromycin and prednisone.  D-dimer elevated at 5500.  Was evaluated in the ER.  CT chest said right peribronchial markings with subpleural ground count groundglass.  Extensive.  Question aspiration or fibrosis.   He was seen again in the walk-in clinic on 713.  Cough.  Wheezing.  Short of breath.  Had lost weight.  ALT AST normal.  Alk phos 244.  Sinus tachycardia at 110.  He received albuterol and Levaquin.  D-dimer repeat was 4700.  Ferritin up at 343.   Platelets up at 6 71,000.  Hemoglobin down to 10.2.  He was admitted yesterday.  Major complaints were weakness.  Had low-grade fever.  COVID negative.  On antibiotics for presumed pneumonia.  has had decreased taste.  Had only taken 2 doses of methotrexate 10 mg on consecutive Wednesdays. White count 8900 hemoglobin 8.9.  Platelets 508,000.  Alkaline phosphatase 258 with normal transaminases.  Retake 2.0.  PCR negative.  Viral titers negative.  Urinalysis shows 30 mg of protein but no cells.  Glucose 173  CT abdomen showed normal liver.  Left adrenal adenoma.  Left nephrolithiasis.  Major complaint since admission has been weakness.  Has had to go back to a walker.  Feels like he does not have the strength to get out of bed.  No headache.  No jaw pain.  Joints have not been swelling. Repeat CT of the chest showed reticular pattern right lung.  PMH: Prostate cancer.  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  Osteoarthritis  SURGICAL HISTORY: Hernia.  Hemorrhoidectomy.  Bilateral hip replacements  Family History: Negative for inflammatory disease  Social History: Was working some until recent illness.  No cigarettes or alcohol.  Allergies: No Known Allergies  Medications:  Scheduled: . aspirin  81 mg Oral Daily  . [START ON 05/27/2019] azithromycin  500 mg Oral Daily  .  doxazosin  4 mg Oral Daily  . enoxaparin (LOVENOX) injection  40 mg Subcutaneous Q24H  . finasteride  5 mg Oral Daily  . folic acid  1 mg Oral Daily        ROS: As above.  No abdominal pain.  No chest pain.  No sweats.  No diarrhea.  No numbness or tingling in the legs or arms.  No difficulty swallowing   PHYSICAL EXAM: Blood pressure 112/66, pulse 96, temperature 98.4 F (36.9 C), temperature source Oral, resp. rate 20, height _0  (1.727 m), weight 74.4 kg, SpO2 96 %. Pleasant male.  No acute distress.  Seen sitting in bed.  Eating lunch. Skin without rash. Lymph  insignificant HEENT: Clear sclera.  Clear oropharynx.  No thyromegaly. Clear chest.  No significant crackles today. Cor: Soft systolic murmur.  No regurgitant murmur Abdomen: Soft.  Nontender.  No visceromegaly Extremities: No significant edema Musculoskeletal: Decreased abduction X rotation left shoulder.  Right shoulder moves well.  Mild estrangement cervical spine.  Elbows without synovitis.  Thenar atrophy in the hands but no inflammatory synovitis wrist MCPs.  Hips move well.  Knees without effusions.  Ankles move well. Neurologic: Diffusely hyporeflexic.  No definite fasciculations.  Neck flexors are 5- /5, tricep triceps are 5- over.  Decreased grip bilaterally Cannot raise right leg off the bed.  Can raise left leg.  4/5.  With assistance he can stand on his toes.  Cannot squat without assistance.  Has sensation in his feet  Assessment: Recent diagnosis of rheumatoid arthritis based on synovitis of his hands knees, elevated rheumatoid factor and markedly elevated inflammatory markers.  Status post 2 doses of 10 mg of methotrexate which he feels like he tolerated poorly with nonspecific symptoms.  No evidence of synovitis today  Evidence of pulmonary fibrosis versus inflammation versus interstitial changes.  Presentation would be a little atypical for methotrexate pneumonitis given the asymmetry  Decreased taste.  Weight loss.  Out of proportion to the above  Generalized muscle weakness.  More proximal and lower extremities with absent reflexes.  Acute versus subacute.  He has gone back to having to use a walker  Thrombocytosis.  Raises the question of an inflammatory illness.  History of immune neutropenia.  Now with normal white count.  Did have 10% large granular lymphocytes on flow studies previously  History of prostate cancer  Worsening anemia  Elevated alkaline phosphatase.  Normal current transaminases though prior transaminases were normal.  Liver is not thought to be  abnormal on recent abdominal CT.  Suspect bony.  Did have recent hip replacement.  Versus other etiology.(No previous evidence of monoclonal gammopathy) pathology showed osteoarthritis  Elevated d-dimer without obvious pulmonary embolism or DVT  Left renal nephrolithiasis  Adrenal adenoma  Mild proteinuria  Osteoarthritis.  Knees.  Status post bilateral hip replacements.  Left shoulder  Recommendations:  Labs ordered.  Sed rate, CRP ,serum and urine immunoelectrophoresis CPK.  Urine protein to creatinine ratio Hold methotrexate.  Not of evidence for steroids unless so indicated by pulmonary Neurology opinion regarding his lower extremity weakness PT.  They saw him in the hospital after his right hip replacement and would be helpful in determining as to whether his weakness is much different than at that time    Emmaline Kluver 05/26/2019, 3:28 PM

## 2019-05-27 ENCOUNTER — Telehealth: Payer: Self-pay

## 2019-05-27 ENCOUNTER — Other Ambulatory Visit: Payer: Self-pay | Admitting: Oncology

## 2019-05-27 DIAGNOSIS — D509 Iron deficiency anemia, unspecified: Secondary | ICD-10-CM

## 2019-05-27 DIAGNOSIS — J849 Interstitial pulmonary disease, unspecified: Secondary | ICD-10-CM

## 2019-05-27 LAB — BASIC METABOLIC PANEL
Anion gap: 6 (ref 5–15)
BUN: 6 mg/dL — ABNORMAL LOW (ref 8–23)
CO2: 27 mmol/L (ref 22–32)
Calcium: 7.5 mg/dL — ABNORMAL LOW (ref 8.9–10.3)
Chloride: 97 mmol/L — ABNORMAL LOW (ref 98–111)
Creatinine, Ser: 0.56 mg/dL — ABNORMAL LOW (ref 0.61–1.24)
GFR calc Af Amer: >60 mL/min (ref >60)
GFR calc non Af Amer: >60 mL/min (ref >60)
Glucose, Bld: 142 mg/dL — ABNORMAL HIGH (ref 70–99)
Potassium: 3.5 mmol/L (ref 3.5–5.1)
Sodium: 130 mmol/L — ABNORMAL LOW (ref 135–145)

## 2019-05-27 LAB — CBC
HCT: 25 % — ABNORMAL LOW (ref 39.0–52.0)
Hemoglobin: 8 g/dL — ABNORMAL LOW (ref 13.0–17.0)
MCH: 26.8 pg (ref 26.0–34.0)
MCHC: 32 g/dL (ref 30.0–36.0)
MCV: 83.6 fL (ref 80.0–100.0)
Platelets: 424 10*3/uL — ABNORMAL HIGH (ref 150–400)
RBC: 2.99 MIL/uL — ABNORMAL LOW (ref 4.22–5.81)
RDW: 15.1 % (ref 11.5–15.5)
WBC: 9.5 10*3/uL (ref 4.0–10.5)
nRBC: 0 % (ref 0.0–0.2)

## 2019-05-27 LAB — ADENOVIRUS ANTIBODIES: Adenovirus Antibody: NEGATIVE

## 2019-05-27 LAB — ALDOLASE: Aldolase: 3.5 U/L (ref 3.3–10.3)

## 2019-05-27 MED ORDER — FERROUS SULFATE 325 (65 FE) MG PO TABS: 325.0000 mg | ORAL_TABLET | Freq: Two times a day (BID) | ORAL | Status: DC

## 2019-05-27 MED ORDER — POLYETHYLENE GLYCOL 3350 17 G PO PACK
17.0000 g | PACK | Freq: Every day | ORAL | Status: DC | PRN
  Filled 2019-05-27: qty 1

## 2019-05-27 NOTE — Progress Notes (Signed)
Hematology/Oncology Progress Note [facility] Telephone:([phone] [phone]  Patient Care Team: Cyndie Chime, MD as PCP - General (Internal Medicine)   Name of the patient: [name] Foster  [id number]  11-06-1941  Date of visit: 05/27/19   INTERVAL HISTORY-  Patient is lying in the bed with no acute distress. He reports feeling better since admission.  Shortness of breath has improved. No fever Has no appetite. He has been evaluated by rheumatology and pulmonology.   Review of systems- Review of Systems  Constitutional: Positive for appetite change.  Respiratory: Positive for shortness of breath.   Cardiovascular: Negative for chest pain.  Gastrointestinal: Negative for abdominal distention and abdominal pain.  Genitourinary: Negative for dysuria and frequency.   Musculoskeletal: Negative for back pain.  Neurological: Negative for speech difficulty.  Psychiatric/Behavioral: Negative for confusion.    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   Abnormal finding on chest xray 07/18/2013   Chronic cough 07/18/2013   Tests ordered 09/03/2012   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   Benign localized prostatic hyperplasia with lower urinary tract symptoms (LUTS) 07/07/2012   Elevated prostate specific antigen (PSA) 07/07/2012     Past Medical History:  Diagnosis Date   Arthritis    BPH (benign prostatic hyperplasia)    Former smoker,  stopped smoking many years ago    History of needle biopsy of prostate with negative result 2013   Hypertension    Prostate enlargement    Prostatic intraepithelial neoplasm III 2013     Past Surgical History:  Procedure Laterality Date   HEMORROIDECTOMY     HERNIA REPAIR     JOINT REPLACEMENT Left    THR   PROSTATE BIOPSY  2013   TOTAL HIP ARTHROPLASTY Left    TOTAL HIP ARTHROPLASTY Right 04/26/2019   Procedure: TOTAL HIP ARTHROPLASTY ANTERIOR APPROACH;  Surgeon: Hessie Knows, MD;  Location: [facility] ORS;  Service: Orthopedics;  Laterality: Right;    Social History   Socioeconomic History   Marital status: Married    Spouse name: Not on file   Number of children: Not on file   Years of education: Not on file   Highest education level: Not on file  Occupational History   Not on file  Social Needs   Financial resource strain: Not on file   Food insecurity    Worry: Not on file    Inability: Not on file   Transportation needs    Medical: Not on file    Non-medical: Not on file  Tobacco Use   Smoking status: Former Smoker    Quit date: 06/04/1980    Years since quitting: 39.0   Smokeless tobacco: Never Used  Substance and Sexual Activity   Alcohol use: No   Drug use: No   Sexual activity: Yes  Lifestyle   Physical activity    Days per week: Not on file    Minutes per session: Not on file   Stress: Not on file  Relationships   Social Herbalist on phone: Not on file    Gets together: Not on file    Attends religious service: Not on file    Active member of club or organization: Not on file    Attends meetings of clubs or organizations: Not on file    Relationship status: Not on file   Intimate partner violence    Fear of current or ex partner: Not on file    Emotionally abused: Not on file    Physically abused: Not on file    Forced sexual activity: Not on file  Other Topics Concern   Not on file  Social History Narrative    Not on file     Family History  Problem Relation Age of Onset   Prostate cancer Father    Cancer Father    Prostate cancer Brother    Cancer Mother      Current Facility-Administered Medications:    0.9 %  sodium chloride infusion, , Intravenous, Continuous, Max Sane, MD, Last Rate: 75 mL/hr at 05/26/19 1815   aspirin chewable tablet 81 mg, 81 mg, Oral, Daily, Max Sane, MD, 81 mg at 05/27/19 0853   azithromycin (ZITHROMAX) tablet 500 mg, 500 mg, Oral, Daily, Max Sane, MD, 500 mg at 05/27/19 0854   benzonatate (TESSALON) capsule 200 mg, 200 mg, Oral, TID PRN, Max Sane, MD   cefTRIAXone (ROCEPHIN) 2 g in sodium chloride 0.9 % 100 mL IVPB, 2 g, Intravenous, Q24H, Manuella Ghazi, Vipul, MD, Last Rate: 200 mL/hr at 05/27/19 0854, 2 g at 05/27/19 0854   doxazosin (CARDURA) tablet 4 mg, 4 mg, Oral, Daily, Max Sane, MD, 4 mg at 05/27/19 0853   enoxaparin (LOVENOX) injection 40 mg, 40 mg, Subcutaneous, Q24H, Manuella Ghazi, Vipul, MD   ferrous sulfate tablet 325 mg, 325 mg, Oral, BID WC, Earlie Server, MD   finasteride (PROSCAR) tablet 5 mg, 5 mg, Oral, Daily, Manuella Ghazi, Vipul, MD, 5 mg at 09/47/09 6283   folic acid (FOLVITE) tablet 1 mg, 1 mg, Oral, Daily, Manuella Ghazi, Vipul, MD, 1 mg at 05/27/19 0853   polyethylene glycol (MIRALAX / GLYCOLAX) packet 17 g, 17 g, Oral, Daily PRN, Max Sane, MD   Physical exam:  Vitals:   05/27/19 0339 05/27/19 0400 05/27/19 0734 05/27/19 0935  BP: 114/62  119/70   Pulse: (!) 102 95 98   Resp: (!) 22  18   Temp: 98.8 F (37.1 C)   98.4 F (36.9 C)  TempSrc: Oral   Oral  SpO2: 98%  95%   Weight:      Height:       Physical Exam     CMP Latest Ref Rng & Units 05/27/2019  Glucose 70 - 99 mg/dL 142(H)  BUN 8 - 23 mg/dL 6(L)  Creatinine 0.61 - 1.24 mg/dL 0.56(L)  Sodium 135 - 145 mmol/L 130(L)  Potassium 3.5 - 5.1 mmol/L 3.5  Chloride 98 - 111 mmol/L 97(L)  CO2 22 - 32 mmol/L 27  Calcium 8.9 - 10.3 mg/dL 7.5(L)  Total Protein 6.5 - 8.1 g/dL -    Total Bilirubin 0.3 - 1.2 mg/dL -  Alkaline Phos 38 - 126 U/L -  AST 15 - 41 U/L -  ALT 0 - 44 U/L -   CBC Latest Ref Rng & Units 05/27/2019  WBC 4.0 - 10.5 K/uL 9.5  Hemoglobin 13.0 - 17.0 g/dL 8.0(L)  Hematocrit 39.0 - 52.0 % 25.0(L)  Platelets 150 - 400 K/uL 424(H)  RADIOGRAPHIC STUDIES: I have personally reviewed the radiological images as listed and agreed with the findings in the report. Ct Chest W Contrast  Result Date: 05/25/2019 CLINICAL DATA:  Chronic dyspnea.  Fever. EXAM: CT CHEST, ABDOMEN, AND PELVIS WITH CONTRAST TECHNIQUE: Multidetector CT imaging of the chest, abdomen and pelvis was performed following the standard protocol during bolus administration of intravenous contrast. CONTRAST:  18mL OMNIPAQUE IOHEXOL 300 MG/ML  SOLN COMPARISON:  Chest CT from 9 days ago. Abdomen and pelvis CT 06/25/2010 FINDINGS: CT CHEST FINDINGS Cardiovascular: Normal heart size. Trace pericardial fluid. No acute vascular finding. Mediastinum/Nodes: Stable prominent bilateral hilar lymph nodes which are symmetric and noncalcified. Lungs/Pleura: Unchanged airway thickening with asymmetric right-sided pulmonary reticulation with a mild subpleural predilection. This could reflect atypical infection, sequela of aspiration, or inflammatory pneumonia. No acute airspace disease, edema, effusion, honeycombing, or pneumothorax. Musculoskeletal: Degenerative disease without acute finding CT ABDOMEN PELVIS FINDINGS Hepatobiliary: No focal liver abnormality.No evidence of biliary obstruction or stone. Pancreas: Unremarkable. Spleen: Unremarkable. Adrenals/Urinary Tract: 11 mm left adrenal adenoma that remain size stable. 6 mm left lower pole renal calculus. No hydronephrosis or ureteral stone. Limited assessment of the bladder due to streak artifact. Tiny right renal cystic density. Stomach/Bowel:  No obstruction. No evidence of bowel inflammation Vascular/Lymphatic: Mild atherosclerosis. Prominent peripancreatic  arterial arcade with tortuous hepatic artery, stable. No underlying obstructive process is seen. No mass or adenopathy. Reproductive:Pelvic structures are largely obscured by hip prostheses. Other: No ascites or pneumoperitoneum. Musculoskeletal: Advanced lower lumbar facet and disc degeneration with multilevel listhesis and scoliosis. IMPRESSION: Chest CT: 1. No acute finding or change from 05/16/2019. 2. Asymmetric reticulation in the right lung as described previously, please see follow-up recommendations at that time. Abdominal CT: 1. No emergent finding. 2. Left nephrolithiasis and small left adrenal adenoma. Electronically Signed   By: Monte Fantasia M.D.   On: 05/25/2019 05:22   Ct Angio Chest Pe W And/or Wo Contrast  Result Date: 05/16/2019 CLINICAL DATA:  PE suspected, positive D-dimer EXAM: CT ANGIOGRAPHY CHEST WITH CONTRAST TECHNIQUE: Multidetector CT imaging of the chest was performed using the standard protocol during bolus administration of intravenous contrast. Multiplanar CT image reconstructions and MIPs were obtained to evaluate the vascular anatomy. CONTRAST:  55mL OMNIPAQUE IOHEXOL 350 MG/ML SOLN COMPARISON:  None. FINDINGS: Cardiovascular: Examination is mildly limited by motion artifact and marginal contrast bolus for the evaluation of pulmonary embolism, main pulmonary artery = 195 HU. Within this limitation, no evidence of pulmonary embolism through the segmental pulmonary arterial level. Normal heart size. No pericardial effusion. Mediastinum/Nodes: Prominent bilateral axillary lymph nodes. Prominent although not pathologically enlarged mediastinal and hilar lymph nodes thyroid gland, trachea, and esophagus demonstrate no significant findings. Lungs/Pleura: There is diffuse bilateral bronchial wall thickening, most conspicuous in the right lower lobe, with extensive, predominantly right-sided subpleural ground-glass and irregular pulmonary opacity nodularity. Scarring and/or atelectasis  of the dependent bilateral lung bases. No pleural effusion or pneumothorax. Upper Abdomen: No acute abnormality. Musculoskeletal: No chest wall abnormality. No acute or significant osseous findings. Review of the MIP images confirms the above findings. IMPRESSION: 1. Examination is mildly limited by motion artifact and marginal contrast bolus for the evaluation of pulmonary embolism, main pulmonary artery = 195 HU. Within this limitation, no evidence of pulmonary embolism through the segmental pulmonary arterial level. 2. There is diffuse bilateral bronchial wall thickening, most conspicuous in the right lower lobe, with extensive, predominantly right-sided subpleural ground-glass and irregular pulmonary opacity nodularity. Scarring and/or atelectasis of the dependent  bilateral lung bases. Generally suspect atypical infection and/or chronic sequelae, such as aspiration, however mild pulmonary fibrosis is not excluded. Consider follow-up ILD protocol CT in 6-12 months if clinically appropriate. Electronically Signed   By: Eddie Candle M.D.   On: 05/16/2019 18:45   Ct Abdomen Pelvis W Contrast  Result Date: 05/25/2019 CLINICAL DATA:  Chronic dyspnea.  Fever. EXAM: CT CHEST, ABDOMEN, AND PELVIS WITH CONTRAST TECHNIQUE: Multidetector CT imaging of the chest, abdomen and pelvis was performed following the standard protocol during bolus administration of intravenous contrast. CONTRAST:  145mL OMNIPAQUE IOHEXOL 300 MG/ML  SOLN COMPARISON:  Chest CT from 9 days ago. Abdomen and pelvis CT 06/25/2010 FINDINGS: CT CHEST FINDINGS Cardiovascular: Normal heart size. Trace pericardial fluid. No acute vascular finding. Mediastinum/Nodes: Stable prominent bilateral hilar lymph nodes which are symmetric and noncalcified. Lungs/Pleura: Unchanged airway thickening with asymmetric right-sided pulmonary reticulation with a mild subpleural predilection. This could reflect atypical infection, sequela of aspiration, or inflammatory  pneumonia. No acute airspace disease, edema, effusion, honeycombing, or pneumothorax. Musculoskeletal: Degenerative disease without acute finding CT ABDOMEN PELVIS FINDINGS Hepatobiliary: No focal liver abnormality.No evidence of biliary obstruction or stone. Pancreas: Unremarkable. Spleen: Unremarkable. Adrenals/Urinary Tract: 11 mm left adrenal adenoma that remain size stable. 6 mm left lower pole renal calculus. No hydronephrosis or ureteral stone. Limited assessment of the bladder due to streak artifact. Tiny right renal cystic density. Stomach/Bowel:  No obstruction. No evidence of bowel inflammation Vascular/Lymphatic: Mild atherosclerosis. Prominent peripancreatic arterial arcade with tortuous hepatic artery, stable. No underlying obstructive process is seen. No mass or adenopathy. Reproductive:Pelvic structures are largely obscured by hip prostheses. Other: No ascites or pneumoperitoneum. Musculoskeletal: Advanced lower lumbar facet and disc degeneration with multilevel listhesis and scoliosis. IMPRESSION: Chest CT: 1. No acute finding or change from 05/16/2019. 2. Asymmetric reticulation in the right lung as described previously, please see follow-up recommendations at that time. Abdominal CT: 1. No emergent finding. 2. Left nephrolithiasis and small left adrenal adenoma. Electronically Signed   By: Monte Fantasia M.D.   On: 05/25/2019 05:22   Dg Chest Portable 1 View  Result Date: 05/25/2019 CLINICAL DATA:  Fever cough and weakness EXAM: PORTABLE CHEST 1 VIEW COMPARISON:  07/12/2013 FINDINGS: The heart size and mediastinal contours are within normal limits. Both lungs are clear. The visualized skeletal structures are unremarkable. IMPRESSION: No active disease. Electronically Signed   By: Donavan Foil M.D.   On: 05/25/2019 03:05    Assessment and plan-  Patient is a 78 y.o. male with history of autoimmune neutropenia, arthritis, knee replacement presents to emergency room for evaluation of fever,  chills, cough, generalized weakness.  Was found to have anemia which is new onset. CT chest indicates bronchial thickening.   #History of autoimmune neutropenia with positive anti-platelet antibody.  # Anemia, he had iron panel done recently at [facility] Low iron saturation, elevated Ferritin, which maybe falsely elevated due inflammation. retic hemoglobin low, indicating possible IDA. Recent start of Aspirin after knee replacement Last colonoscopy was +10 years ago.  Check stool occult.  IV Iron was discussed with patient and daughter on 05/25/2019, and due to acute illness, will proceed in outpatient setting. Patient and daughter agree with oral iron supplementation and follow up outpatient.   # ILD, follow up with pulmonology # RA, see by Dr.Kernodle.   Thank you for allowing me to participate in the care of this patient.   Earlie Server, MD, PhD Hematology Oncology [facility] at [facility] Pager- [phone] 05/27/2019

## 2019-05-27 NOTE — Consult Note (Signed)
Reason for Consult:Weakness Referring Physician: Manuella Ghazi  CC: Weakness  HPI: [name] Foster is an 78 y.o. male with recent diagnosis of RA and recent hip surgery.  Was doing fairly well with rehab and by the beginning of July was ambulating with a cane.  Started Methotrexate for his RA on July 1 and per daughter patient has been getting gradually weaker since that time.  Currently is requiring a walker for gait.  Has PNA as well.    Past Medical History:  Diagnosis Date  . Arthritis   . BPH (benign prostatic hyperplasia)   . Former smoker, stopped smoking many years ago   . History of needle biopsy of prostate with negative result 2013  . Hypertension   . Prostate enlargement   . Prostatic intraepithelial neoplasm III 2013    Past Surgical History:  Procedure Laterality Date  . HEMORROIDECTOMY    . HERNIA REPAIR    . JOINT REPLACEMENT Left    THR  . PROSTATE BIOPSY  2013  . TOTAL HIP ARTHROPLASTY Left   . TOTAL HIP ARTHROPLASTY Right 04/26/2019   Procedure: TOTAL HIP ARTHROPLASTY ANTERIOR APPROACH;  Surgeon: Hessie Knows, MD;  Location: [facility] ORS;  Service: Orthopedics;  Laterality: Right;    Family History  Problem Relation Age of Onset  . Prostate cancer Father   . Cancer Father   . Prostate cancer Brother   . Cancer Mother     Social History:  reports that he quit smoking about 39 years ago. He has never used smokeless tobacco. He reports that he does not drink alcohol or use drugs.  No Known Allergies  Medications:  I have reviewed the patient's current medications. Prior to Admission:  Medications Prior to Admission  Medication Sig Dispense Refill Last Dose  . aspirin 81 MG chewable tablet Chew 1 tablet (81 mg total) by mouth 2 (two) times daily for 30 days. (Patient taking differently: Chew 81 mg by mouth daily. ) 60 tablet 0 05/24/2019 at Unknown time  . benzonatate (TESSALON) 200 MG capsule Take 200 mg by mouth 3 (three) times daily as needed for cough.   prn at  prn  . doxazosin (CARDURA) 4 MG tablet Take 1 tablet (4 mg total) by mouth daily. 90 tablet 3 05/24/2019 at Unknown time  . finasteride (PROSCAR) 5 MG tablet Take 1 tablet (5 mg total) by mouth daily. 90 tablet 3 05/24/2019 at Unknown time  . folic acid (FOLVITE) 1 MG tablet Take 1 mg by mouth daily.   05/24/2019 at Unknown time  . levofloxacin (LEVAQUIN) 500 MG tablet Take 500 mg by mouth daily.   05/24/2019 at Unknown time  . methotrexate 2.5 MG tablet Take 10 mg by mouth once a week.   Past Week at Unknown time  . docusate sodium (COLACE) 100 MG capsule Take 1 capsule (100 mg total) by mouth 2 (two) times daily. (Patient not taking: Reported on 05/25/2019) 10 capsule 0 Not Taking at Unknown time  . ferrous [id number] C-folic acid (TRINSICON / FOLTRIN) capsule Take 1 capsule by mouth 2 (two) times daily. (Patient not taking: Reported on 05/25/2019) 20 capsule 0 Not Taking at Unknown time  . HYDROcodone-acetaminophen (NORCO/VICODIN) 5-325 MG tablet Take 1-2 tablets by mouth every 4 (four) hours as needed for moderate pain (pain score 4-6). (Patient not taking: Reported on 05/25/2019) 40 tablet 0 Completed Course at Unknown time   Scheduled: . aspirin  81 mg Oral Daily  . azithromycin  500 mg Oral Daily  .  doxazosin  4 mg Oral Daily  . enoxaparin (LOVENOX) injection  40 mg Subcutaneous Q24H  . ferrous sulfate  325 mg Oral BID WC  . finasteride  5 mg Oral Daily  . folic acid  1 mg Oral Daily    ROS: History obtained from the patient  General ROS: fever Psychological ROS: negative for - behavioral disorder, hallucinations, memory difficulties, mood swings or suicidal ideation Ophthalmic ROS: negative for - blurry vision, double vision, eye pain or loss of vision ENT ROS: negative for - epistaxis, nasal discharge, oral lesions, sore throat, tinnitus or vertigo Allergy and Immunology ROS: negative for - hives or itchy/watery eyes Hematological and Lymphatic ROS: negative for - bleeding  problems, bruising or swollen lymph nodes Endocrine ROS: negative for - galactorrhea, hair pattern changes, polydipsia/polyuria or temperature intolerance Respiratory ROS: negative for - cough, hemoptysis, shortness of breath or wheezing Cardiovascular ROS: negative for - chest pain, dyspnea on exertion, edema or irregular heartbeat Gastrointestinal ROS: negative for - abdominal pain, diarrhea, hematemesis, nausea/vomiting or stool incontinence Genito-Urinary ROS: negative for - dysuria, hematuria, incontinence or urinary frequency/urgency Musculoskeletal ROS: joint pain Neurological ROS: as noted in HPI Dermatological ROS: negative for rash and skin lesion changes  Physical Examination: Blood pressure 119/70, pulse 98, temperature 98.4 F (36.9 C), temperature source Oral, resp. rate 18, height 5\' 8"  (1.727 m), weight 74.4 kg, SpO2 95 %.  HEENT-  Normocephalic, no lesions, without obvious abnormality.  Normal external eye and conjunctiva.  Normal TM's bilaterally.  Normal auditory canals and external ears. Normal external nose, mucus membranes and septum.  Normal pharynx. Cardiovascular- S1, S2 normal, pulses palpable throughout   Lungs- chest clear, no wheezing, rales, normal symmetric air entry Abdomen- soft, non-tender; bowel sounds normal; no masses,  no organomegaly Extremities- no edema Lymph-no adenopathy palpable Musculoskeletal-no joint tenderness, deformity or swelling Skin-warm and dry, no hyperpigmentation, vitiligo, or suspicious lesions  Neurological Examination   Mental Status: Alert, oriented, thought content appropriate.  Speech fluent without evidence of aphasia.  Able to follow 3 step commands without difficulty. Cranial Nerves: II: Discs flat bilaterally; Visual fields grossly normal, pupils equal, round, reactive to light and accommodation III,IV, VI: ptosis not present, extra-ocular motions intact bilaterally V,VII: weak smile, facial light touch sensation normal  bilaterally VIII: hearing normal bilaterally IX,X: gag reflex present XI: bilateral shoulder shrug XII: midline tongue extension Motor: 4+/5 deltoid strength bilaterally.  Patient 5/5 otherwise in the upper extremities.  4/5 right hip flexor, 4+/5 left hip flexor.  Patient 5/5 distally in the lower extremities Sensory: Pinprick and light touch intact throughout, bilaterally Deep Tendon Reflexes: 1+ in the upper extremities and absent in the lower extremities Plantars: Right: mute   Left: mute Cerebellar: Normal finger-to-nose and normal heel-to-shin testing bilaterally Gait: not tested due to safety concerns    Laboratory Studies:   Basic Metabolic Panel: Recent Labs  Lab 05/24/19 2157 05/25/19 1037 05/27/19 0803  NA 130*  --  130*  K 4.1  --  3.5  CL 94*  --  97*  CO2 28  --  27  GLUCOSE 173*  --  142*  BUN 16  --  6*  CREATININE 0.79 0.64 0.56*  CALCIUM 8.0*  --  7.5*    Liver Function Tests: Recent Labs  Lab 05/24/19 2157  AST 26  ALT 27  ALKPHOS 258*  BILITOT 0.8  PROT 6.7  ALBUMIN 2.1*   No results for input(s): LIPASE, AMYLASE in the last 168 hours. No results  for input(s): AMMONIA in the last 168 hours.  CBC: Recent Labs  Lab 05/24/19 2157 05/25/19 1037 05/27/19 0803  WBC 11.0* 8.9 9.5  NEUTROABS 7.9*  --   --   HGB 9.0* 8.9* 8.0*  HCT 28.3* 28.3* 25.0*  MCV 83.0 84.2 83.6  PLT 575* 508* 424*    Cardiac Enzymes: Recent Labs  Lab 05/26/19 1540  CKTOTAL 18*    BNP: Invalid input(s): POCBNP  CBG: No results for input(s): GLUCAP in the last 168 hours.  Microbiology: Results for orders placed or performed during the hospital encounter of 05/25/19  Blood Culture (routine x 2)     Status: None (Preliminary result)   Collection Time: 05/25/19  3:06 AM   Specimen: BLOOD  Result Value Ref Range Status   Specimen Description BLOOD LEFT WRIST  Final   Special Requests   Final    BOTTLES DRAWN AEROBIC AND ANAEROBIC Blood Culture adequate  volume   Culture   Final    NO GROWTH 2 DAYS Performed at [facility] [address], [place], [place] [address]    Report Status PENDING  Incomplete  Blood Culture (routine x 2)     Status: None (Preliminary result)   Collection Time: 05/25/19  3:06 AM   Specimen: BLOOD  Result Value Ref Range Status   Specimen Description BLOOD LEFT ASSIST CONTROL  Final   Special Requests   Final    BOTTLES DRAWN AEROBIC AND ANAEROBIC Blood Culture adequate volume   Culture   Final    NO GROWTH 2 DAYS Performed at [facility], [address] [place], [place] [address]    Report Status PENDING  Incomplete  Urine culture     Status: None   Collection Time: 05/25/19  3:06 AM   Specimen: Urine, Random  Result Value Ref Range Status   Specimen Description   Final    URINE, RANDOM Performed at [facility], [address], [place], [place] [address]    Special Requests   Final    Normal Performed at [facility], [address] [place], [place] [address]    Culture   Final    NO GROWTH Performed at [facility], [place] [address], [place], [place] [address]    Report Status 05/26/2019 FINAL  Final  SARS Coronavirus 2 (CEPHEID- Performed in [place] hospital lab), Hosp Order     Status: None   Collection Time: 05/25/19  3:06 AM   Specimen: Nasopharyngeal Swab  Result Value Ref Range Status   SARS Coronavirus 2 NEGATIVE NEGATIVE Final    Comment: (NOTE) If result is NEGATIVE SARS-CoV-2 target nucleic acids are NOT DETECTED. The SARS-CoV-2 RNA is generally detectable in upper and lower  respiratory specimens during the acute phase of infection. The lowest  concentration of SARS-CoV-2 viral copies this assay can detect is 250  copies / mL. A negative result does not preclude SARS-CoV-2 infection  and should not be used as the sole basis for treatment or other  patient management decisions.  A negative result may occur with  improper specimen  collection / handling, submission of specimen other  than nasopharyngeal swab, presence of viral mutation(s) within the  areas targeted by this assay, and inadequate number of viral copies  (<250 copies / mL). A negative result must be combined with clinical  observations, patient history, and epidemiological information. If result is POSITIVE SARS-CoV-2 target nucleic acids are DETECTED. The SARS-CoV-2 RNA is generally detectable  in upper and lower  respiratory specimens dur ing the acute phase of infection.  Positive  results are indicative of active infection with SARS-CoV-2.  Clinical  correlation with patient history and other diagnostic information is  necessary to determine patient infection status.  Positive results do  not rule out bacterial infection or co-infection with other viruses. If result is PRESUMPTIVE POSTIVE SARS-CoV-2 nucleic acids MAY BE PRESENT.   A presumptive positive result was obtained on the submitted specimen  and confirmed on repeat testing.  While 2019 novel coronavirus  (SARS-CoV-2) nucleic acids may be present in the submitted sample  additional confirmatory testing may be necessary for epidemiological  and / or clinical management purposes  to differentiate between  SARS-CoV-2 and other Sarbecovirus currently known to infect humans.  If clinically indicated additional testing with an alternate test  methodology [phone]) is advised. The SARS-CoV-2 RNA is generally  detectable in upper and lower respiratory sp ecimens during the acute  phase of infection. The expected result is Negative. Fact Sheet for Patients:  StrictlyIdeas.no Fact Sheet for Healthcare Providers: BankingDealers.co.za This test is not yet approved or cleared by the [place] FDA and has been authorized for detection and/or diagnosis of SARS-CoV-2 by FDA under an Emergency Use Authorization (EUA).  This EUA will remain in effect  (meaning this test can be used) for the duration of the COVID-19 declaration under Section 564(b)(1) of the Act, 21 U.S.C. section 360bbb-3(b)(1), unless the authorization is terminated or revoked sooner. Performed at [facility], [address] [place], [place] [address]   Respiratory Panel by PCR     Status: None   Collection Time: 05/25/19 10:30 AM   Specimen: Nasopharyngeal Swab; Respiratory  Result Value Ref Range Status   Adenovirus NOT DETECTED NOT DETECTED Final   Coronavirus 229E NOT DETECTED NOT DETECTED Final    Comment: (NOTE) The Coronavirus on the Respiratory Panel, DOES NOT test for the novel  Coronavirus (2019 nCoV)    Coronavirus HKU1 NOT DETECTED NOT DETECTED Final   Coronavirus NL63 NOT DETECTED NOT DETECTED Final   Coronavirus OC43 NOT DETECTED NOT DETECTED Final   Metapneumovirus NOT DETECTED NOT DETECTED Final   Rhinovirus / Enterovirus NOT DETECTED NOT DETECTED Final   Influenza A NOT DETECTED NOT DETECTED Final   Influenza B NOT DETECTED NOT DETECTED Final   Parainfluenza Virus 1 NOT DETECTED NOT DETECTED Final   Parainfluenza Virus 2 NOT DETECTED NOT DETECTED Final   Parainfluenza Virus 3 NOT DETECTED NOT DETECTED Final   Parainfluenza Virus 4 NOT DETECTED NOT DETECTED Final   Respiratory Syncytial Virus NOT DETECTED NOT DETECTED Final   Bordetella pertussis NOT DETECTED NOT DETECTED Final   Chlamydophila pneumoniae NOT DETECTED NOT DETECTED Final   Mycoplasma pneumoniae NOT DETECTED NOT DETECTED Final    Comment: Performed at [facility], [address] [place], [place] [address]    Coagulation Studies: No results for input(s): LABPROT, INR in the last 72 hours.  Urinalysis:  Recent Labs  Lab 05/25/19 0306  COLORURINE AMBER*  LABSPEC 1.030  PHURINE 5.0  GLUCOSEU NEGATIVE  HGBUR NEGATIVE  BILIRUBINUR NEGATIVE  KETONESUR NEGATIVE  PROTEINUR 30*  NITRITE NEGATIVE  LEUKOCYTESUR NEGATIVE    Lipid Panel:  No results found  for: CHOL, TRIG, HDL, CHOLHDL, VLDL, LDLCALC  HgbA1C: No results found for: HGBA1C  Urine Drug Screen:  No results found for: LABOPIA, COCAINSCRNUR, LABBENZ, AMPHETMU, THCU, LABBARB  Alcohol Level: No results for input(s): ETH in the  last 168 hours.  Other results: EKG: sinus tachycardia at 115 bpm.  Imaging: No results found.   Assessment/Plan: 78 year old male with complaints of weakness.  Noted to have proximal muscle weakness in both the upper and lower extremities.  Patient and family are convinced this is secondary to methotrexate since his symptoms became an issue after its initiation.  This has now been discontinued.  Patient also with some infection and metabolic abnormalities that are being addressed.    If strength does not return, further work up would be indicated to include myasthenia panel, CK NCV/EMG.  Recommendations: 1. Patient to be scheduled to see neurology on an outpatient basis in 3-4 weeks.   2. PT/OT  Discussion has with daughter per telephone and all questions addressed.  Case discussed with Dr. Manuella Ghazi.  Alexis Goodell, MD Neurology [phone] 05/27/2019, 12:44 PM

## 2019-05-27 NOTE — Consult Note (Signed)
[facility] Consultation      Assessment and Plan:  ILD. 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  --Likely related to underlying RA. Doubt secondary to methotrexate, as the patient had only received 2 doses, and some of the ILD changes were seen on earlier CT AP from 2011.  --Recommend continued treatment of RA.  --Will check OP Ct-hi-res and PFT in 3 months, follow up after that. I discussed the plan with the patient's wife via video chat.    Date: 05/27/2019  MRN# [id number] [name] Foster 08/07/1941  Referring Physician: Dr. Carlynn Spry for ILD.   [name] Foster is a 78 y.o. old male seen in consultation for chief complaint of:    Chief Complaint  Patient presents with  . Weakness    HPI:  [name] Foster is a 78 y.o. old male admitted to the hospital 05/25/2019 from his facility with progressive weakness and dry cough.  He was noted to have a fever of as high as 101.5 and treated for pneumonia at the facility, he had been tested twice for COVID-19 but reportedly both of those tests were negative. Patient was noted to be on methotrexate, for rheumatoid arthritis which seemed to be before the weakness started, and the patient had also lost about 20 pounds.  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 Patient was also seen by rheumatology and noted to have taken only 2 days of weekly methotrexate, 10 mg each. In the meantime he is being treated empirically for pneumonia, and has significantly improved, he is being considered for discharge later today.  Currently he denies dyspnea, he denies exposure to TB, asbestos, other occupational exposures. He [place] most [place], he quit smoking over 20 years ago. He is fairly active, he works outside a lot, and [name] Foster. He has been fairly inactive since his hip surgery in June, but denies significant dyspnea or respiratory symptoms.    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  Mild subcarinal and hilar lymphadenopathy.  The dependent changes appear to be worse since previous CT chest on 06/25/2010.  PMHX:   Past Medical History:  Diagnosis Date  . Arthritis   . BPH (benign prostatic hyperplasia)   . Former smoker, stopped smoking many years ago   . History of needle biopsy of prostate with negative result 2013  . Hypertension   . Prostate enlargement   . Prostatic intraepithelial neoplasm III 2013   Surgical Hx:  Past Surgical History:  Procedure Laterality Date  . HEMORROIDECTOMY    . HERNIA REPAIR    . JOINT REPLACEMENT Left    THR  . PROSTATE BIOPSY  2013  . TOTAL HIP ARTHROPLASTY Left   . TOTAL HIP ARTHROPLASTY Right 04/26/2019   Procedure: TOTAL HIP ARTHROPLASTY ANTERIOR APPROACH;  Surgeon: [name] Knows, MD;  Location: [facility] ORS;  Service: Orthopedics;  Laterality: Right;   Family Hx:  Family History  Problem Relation Age of Onset  . Prostate cancer Father   . Cancer Father   . Prostate cancer Brother   . Cancer Mother    Social Hx:   Social History   Tobacco Use  . Smoking status: Former Smoker    Quit date: 06/04/1980    Years since quitting: 39.0  . Smokeless tobacco:  Never Used  Substance Use Topics  . Alcohol use: No  . Drug use: No   Medication:    Current Facility-Administered Medications:  .  0.9 %  sodium chloride infusion, , Intravenous, Continuous, [place] Sane, MD, Last Rate: 75 mL/hr at 05/26/19 1815 .  aspirin chewable tablet 81 mg, 81 mg, Oral, Daily, [place] Sane, MD, 81 mg at 05/27/19 0853 .  azithromycin (ZITHROMAX) tablet 500 mg, 500 mg, Oral, Daily, [place] Sane, MD, 500 mg at 05/27/19 0854 .  benzonatate (TESSALON) capsule 200 mg, 200 mg, Oral, TID PRN, Manuella Ghazi, Vipul, MD .  cefTRIAXone (ROCEPHIN) 2 g in sodium  chloride 0.9 % 100 mL IVPB, 2 g, Intravenous, Q24H, Manuella Ghazi, Vipul, MD, Last Rate: 200 mL/hr at 05/27/19 0854, 2 g at 05/27/19 0854 .  doxazosin (CARDURA) tablet 4 mg, 4 mg, Oral, Daily, [place] Sane, MD, 4 mg at 05/27/19 0853 .  enoxaparin (LOVENOX) injection 40 mg, 40 mg, Subcutaneous, Q24H, Manuella Ghazi, Vipul, MD .  finasteride (PROSCAR) tablet 5 mg, 5 mg, Oral, Daily, [place] Sane, MD, 5 mg at 05/27/19 0853 .  folic acid (FOLVITE) tablet 1 mg, 1 mg, Oral, Daily, [place] Sane, MD, 1 mg at 05/27/19 0853 .  polyethylene glycol (MIRALAX / GLYCOLAX) packet 17 g, 17 g, Oral, Daily PRN, [place] Sane, MD   Allergies:  Patient has no known allergies.  Review of Systems: Gen:  Denies  fever, sweats, chills HEENT: Denies blurred vision, double vision. bleeds, sore throat Cvc:  No dizziness, chest pain. Resp:   Denies cough or sputum production, shortness of breath Gi: Denies swallowing difficulty, stomach pain. Gu:  Denies bladder incontinence, burning urine Ext:   No Joint pain, stiffness. Skin: No skin rash,  hives  Endoc:  No polyuria, polydipsia. Psych: No depression, insomnia. Other:  All other systems were reviewed with the patient and were negative other that what is mentioned in the HPI.   Physical Examination:   VS: BP 119/70 (BP Location: Right Arm)   Pulse 98   Temp 98.4 F (36.9 C) (Oral)   Resp 18   Ht 5\' 8"  (1.727 m)   Wt 74.4 kg   SpO2 95%   BMI 24.94 kg/m   General Appearance: No distress  Neuro:without focal findings,  speech normal,  HEENT: PERRLA, EOM intact.   Pulmonary: normal breath sounds, No wheezing. MIld bibasilar inspiratory crackles.  CardiovascularNormal S1,S2.  No m/r/g.   Abdomen: Benign, Soft, non-tender. Renal:  No costovertebral tenderness  GU:  No performed at this time. Endoc: No evident thyromegaly, no signs of acromegaly. Skin:   warm, no rashes, no ecchymosis  Extremities: normal, no cyanosis, clubbing.  Other findings:    LABORATORY PANEL:   CBC  Recent Labs  Lab 05/27/19 0803  WBC 9.5  HGB 8.0*  HCT 25.0*  PLT 424*   ------------------------------------------------------------------------------------------------------------------  Chemistries  Recent Labs  Lab 05/24/19 2157  05/27/19 0803  NA 130*  --  130*  K 4.1  --  3.5  CL 94*  --  97*  CO2 28  --  27  GLUCOSE 173*  --  142*  BUN 16  --  6*  CREATININE 0.79   < > 0.56*  CALCIUM 8.0*  --  7.5*  AST 26  --   --   ALT 27  --   --   ALKPHOS 258*  --   --   BILITOT 0.8  --   --    < > = values in this  interval not displayed.   ------------------------------------------------------------------------------------------------------------------  Cardiac Enzymes No results for input(s): TROPONINI in the last 168 hours. ------------------------------------------------------------  RADIOLOGY:  No results found.     Thank  you for the consultation and for allowing [facility] to assist in the care of your patient. Our recommendations are noted above.  Please contact us if we can be of further service.   Marda Stalker, M.D., F.C.C.P.  Board Certified in Internal Medicine, Pulmonary Medicine, [place], and Sleep Medicine.  [facility] Office Number: [phone]   05/27/2019

## 2019-05-27 NOTE — Telephone Encounter (Signed)
Pt's spouse, [name] (dpr) is aware of below message and voiced her understanding.  Order for CT and PFT has been scheduled.  Recall has been placed for 3 months.  Nothing further is needed at this time.

## 2019-05-27 NOTE — Progress Notes (Signed)
OT Cancellation Note  Patient Details Name: [name] Foster MRN: [id number] DOB: 04-19-1941   Cancelled Treatment:    Reason Eval/Treat Not Completed: Fatigue/lethargy limiting ability to participate. Order received, chart reviewed. Pt sleeping soundly upon attempt, does not wake to verbal or light tactile cues. Will re-attempt OT evaluation later this date as pt is available and medically appropriate.  Jeni Salles, MPH, MS, OTR/L ascom [phone] 05/27/19, 11:10 AM

## 2019-05-27 NOTE — Care Management Important Message (Signed)
Important Message  Patient Details  Name: [name] Foster MRN: [id number] Date of Birth: 1940-12-10   Medicare Important Message Given:  Yes     Dannette Barbara 05/27/2019, 11:51 AM

## 2019-05-27 NOTE — Progress Notes (Signed)
Palliative: [name] Foster is lying quietly in bed. He greets me, making and mostly keeping eye contact. He appears well nourished. We talk about [facility] PT, and [name] Foster tells me that he feels [facility] PT would help him. He has not other questions or concerns at this time.   Plan: PT eval for HH PT.   15 minutes Quinn Axe, NP Palliative Medicine Team Team Phone # [phone] Greater than 50% of this time was spent counseling and coordinating care related to the above assessment and plan.

## 2019-05-27 NOTE — Telephone Encounter (Signed)
-----   Message from Laverle Hobby, MD sent at 05/27/2019 12:16 PM EDT ----- Regarding: hfu Pt needs hi-res CT chest and PFT, both in about 3 months for ILD. OV with me after that.

## 2019-05-27 NOTE — Progress Notes (Signed)
IVs and tele removed from patient. Discharge instructions given to patient. Verbalized understanding. No acute distress at this time. Wife to transport patient home.

## 2019-05-27 NOTE — TOC Transition Note (Addendum)
Transition of Care [facility] - CM/SW Discharge Note   Patient Details  Name: [name] MRN: [id number] Date of Birth: 05/06/1941  [facility] of Care [facility]) CM/SW Contact:  Ross Ludwig, LCSW Phone Number: 05/27/2019, 6:44 PM   Clinical Narrative:     Patient met with palliative, he is not interested in palliative services at this time, however he would like home health nursing, PT, and aide.  CSW provided choice and he chose [facility] who are able to accept patient.  CSW signing off.  Final next level of care: [place] Barriers to Discharge: Barriers Resolved   Patient Goals and CMS Choice Patient states their goals for this hospitalization and ongoing recovery are:: To return back home with home health CMS Medicare.gov Compare Post Acute Care list provided to:: Patient Choice offered to / list presented to : Patient  Discharge Placement  Patient discharging home with home health, wife transporting him back home.                     Discharge Plan and Services                DME Arranged: N/A         HH Arranged: RN, PT, Nurse's Aide [place] Agency: [facility] ([facility]) Date [place]: 05/27/19 Time [name]: 1245 Representative spoke with at [place]: [place] ([place]) Interventions     Readmission Risk Interventions No flowsheet data found.

## 2019-05-27 NOTE — Discharge Instructions (Signed)
Viral Illness, Adult °Viruses are tiny germs that can get into a person's body and cause illness. There are many different types of viruses, and they cause many types of illness. Viral illnesses can range from mild to severe. They can affect various parts of the body. °Common illnesses that are caused by a virus include colds and the flu. Viral illnesses also include serious conditions such as HIV/AIDS (human immunodeficiency virus/acquired immunodeficiency syndrome). A few viruses have been linked to certain cancers. °What are the causes? °Many types of viruses can cause illness. Viruses invade cells in your body, multiply, and cause the infected cells to malfunction or die. When the cell dies, it releases more of the virus. When this happens, you develop symptoms of the illness, and the virus continues to spread to other cells. If the virus takes over the function of the cell, it can cause the cell to divide and grow out of control, as is the case when a virus causes cancer. °Different viruses get into the body in different ways. You can get a virus by: °· Swallowing food or water that is contaminated with the virus. °· Breathing in droplets that have been coughed or sneezed into the air by an infected person. °· Touching a surface that has been contaminated with the virus and then touching your eyes, nose, or mouth. °· Being bitten by an insect or animal that carries the virus. °· Having sexual contact with a person who is infected with the virus. °· Being exposed to blood or fluids that contain the virus, either through an open cut or during a transfusion. °If a virus enters your body, your body's defense system (immune system) will try to fight the virus. You may be at higher risk for a viral illness if your immune system is weak. °What are the signs or symptoms? °Symptoms vary depending on the type of virus and the location of the cells that it invades. Common symptoms of the main types of viral illnesses  include: °Cold and flu viruses °· Fever. °· Headache. °· Sore throat. °· Muscle aches. °· Nasal congestion. °· Cough. °Digestive system (gastrointestinal) viruses °· Fever. °· Abdominal pain. °· Nausea. °· Diarrhea. °Liver viruses (hepatitis) °· Loss of appetite. °· Tiredness. °· Yellowing of the skin (jaundice). °Brain and spinal cord viruses °· Fever. °· Headache. °· Stiff neck. °· Nausea and vomiting. °· Confusion or sleepiness. °Skin viruses °· Warts. °· Itching. °· Rash. °Sexually transmitted viruses °· Discharge. °· Swelling. °· Redness. °· Rash. °How is this treated? °Viruses can be difficult to treat because they live within cells. Antibiotic medicines do not treat viruses because these drugs do not get inside cells. Treatment for a viral illness may include: °· Resting and drinking plenty of fluids. °· Medicines to relieve symptoms. These can include over-the-counter medicine for pain and fever, medicines for cough or congestion, and medicines to relieve diarrhea. °· Antiviral medicines. These drugs are available only for certain types of viruses. They may help reduce flu symptoms if taken early. There are also many antiviral medicines for hepatitis and HIV/AIDS. °Some viral illnesses can be prevented with vaccinations. A common example is the flu shot. °Follow these instructions at home: °Medicines ° °· Take over-the-counter and prescription medicines only as told by your health care provider. °· If you were prescribed an antiviral medicine, take it as told by your health care provider. Do not stop taking the medicine even if you start to feel better. °· Be aware of when   antibiotics are needed and when they are not needed. Antibiotics do not treat viruses. If your health care provider thinks that you may have a bacterial infection as well as a viral infection, you may get an antibiotic. ? Do not ask for an antibiotic prescription if you have been diagnosed with a viral illness. That will not make your  illness go away faster. ? Frequently taking antibiotics when they are not needed can lead to antibiotic resistance. When this develops, the medicine no longer works against the bacteria that it normally fights. General instructions  Drink enough fluids to keep your urine clear or pale yellow.  Rest as much as possible.  Return to your normal activities as told by your health care provider. Ask your health care provider what activities are safe for you.  Keep all follow-up visits as told by your health care provider. This is important. How is this prevented? Take these actions to reduce your risk of viral infection:  Eat a healthy diet and get enough rest.  Wash your hands often with soap and water. This is especially important when you are in public places. If soap and water are not available, use hand sanitizer.  Avoid close contact with friends and family who have a viral illness.  If you travel to areas where viral gastrointestinal infection is common, avoid drinking water or eating raw food.  Keep your immunizations up to date. Get a flu shot every year as told by your health care provider.  Do not share toothbrushes, nail clippers, razors, or needles with other people.  Always practice safe sex.  Contact a health care provider if:  You have symptoms of a viral illness that do not go away.  Your symptoms come back after going away.  Your symptoms get worse. Get help right away if:  You have trouble breathing.  You have a severe headache or a stiff neck.  You have severe vomiting or abdominal pain. This information is not intended to replace advice given to you by your health care provider. Make sure you discuss any questions you have with your health care provider. Document Released: 03/07/2016 Document Revised: 10/09/2017 Document Reviewed: 03/07/2016 Elsevier Patient Education  2020 [employer].

## 2019-05-27 NOTE — TOC Initial Note (Addendum)
Transition of Care [facility]) - Initial/Assessment Note    Patient Details  Name: [name] Foster MRN: [id number] Date of Birth: November 17, 1940  Transition of Care [facility]) CM/SW Contact:    Ross Ludwig, LCSW Phone Number: 05/27/2019, 6:46 PM  Clinical Narrative:                  Patient is a 78 year old male who lives with his wife.  Patient states he would like home health nurse, pt, and aide to come to his house.  Patient was given choice of agencies, and patient did not have any preference.  CSW contacted Corene Cornea at [facility], and he said they can accept patient today.  CSW notified patient, that [facility] will contact him within 24-48 hours.  Patient was appreciative of information given.  CSW asked patient if he felt like he needed and DME, and patient stated no.      Barriers to Discharge: Barriers Resolved   Patient Goals and CMS Choice Patient states their goals for this hospitalization and ongoing recovery are:: To return back home with home health CMS Medicare.gov Compare Post Acute Care list provided to:: Patient Choice offered to / list presented to : Patient  Expected Discharge Plan and Services           Expected Discharge Date: 05/27/19               DME Arranged: N/A         HH Arranged: RN, PT, Nurse's Aide [place] Agency: [facility] ([place]) Date [place]: 05/27/19 Time [place]: 5993 Representative spoke with at [place]: Corene Cornea  Prior Living Arrangements/Services                       Activities of Daily Living Home Assistive Devices/Equipment: Environmental consultant (specify type), Cane (specify quad or straight) ADL Screening (condition at time of admission) Patient's cognitive ability adequate to safely complete daily activities?: Yes Is the patient deaf or have difficulty hearing?: No Does the patient have difficulty seeing, even when wearing glasses/contacts?: No Does the patient have difficulty  concentrating, remembering, or making decisions?: No Patient able to express need for assistance with ADLs?: Yes Does the patient have difficulty dressing or bathing?: No Independently performs ADLs?: Yes (appropriate for developmental age) Does the patient have difficulty walking or climbing stairs?: No Weakness of Legs: None Weakness of Arms/Hands: None  Permission Sought/Granted                  Emotional Assessment              Admission diagnosis:  Atypical pneumonia [J18.9] Sepsis, due to unspecified organism, unspecified whether acute organ dysfunction present [facility]) [A41.9] Patient Active Problem List   Diagnosis Date Noted  . 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 Pneumonia 05/25/2019  . Sepsis ([place])   . Goals of care, counseling/discussion   . Palliative care by specialist   . DNR (do not resuscitate) discussion   . Atypical pneumonia   . Iron deficiency anemia   . 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 Neutropenia ([place]) 06/04/2017  . Incomplete emptying of bladder 02/03/2017  . 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 Abnormal finding on chest xray 07/18/2013  . Chronic cough 07/18/2013  . Tests ordered 09/03/2012  . 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 Benign localized prostatic hyperplasia with lower urinary tract symptoms (  LUTS) 07/07/2012  . Elevated prostate specific antigen (PSA) 07/07/2012   PCP:  Cyndie Chime, MD Pharmacy:   [place], [place] [address] Phone: [phone] Fax: [phone]  [place] [address] [place] [address] Phone: [phone] Fax: [phone]     Social Determinants of Health (SDOH) Interventions    Readmission Risk Interventions No flowsheet data found.

## 2019-05-28 DIAGNOSIS — D075 Carcinoma in situ of prostate: Secondary | ICD-10-CM | POA: Diagnosis not present

## 2019-05-28 DIAGNOSIS — J208 Acute bronchitis due to other specified organisms: Secondary | ICD-10-CM | POA: Diagnosis not present

## 2019-05-28 DIAGNOSIS — M6281 Muscle weakness (generalized): Secondary | ICD-10-CM | POA: Diagnosis not present

## 2019-05-28 DIAGNOSIS — I10 Essential (primary) hypertension: Secondary | ICD-10-CM | POA: Diagnosis not present

## 2019-05-28 DIAGNOSIS — D3502 Benign neoplasm of left adrenal gland: Secondary | ICD-10-CM | POA: Diagnosis not present

## 2019-05-28 DIAGNOSIS — D509 Iron deficiency anemia, unspecified: Secondary | ICD-10-CM | POA: Diagnosis not present

## 2019-05-28 DIAGNOSIS — D709 Neutropenia, unspecified: Secondary | ICD-10-CM | POA: Diagnosis not present

## 2019-05-28 DIAGNOSIS — N400 Enlarged prostate without lower urinary tract symptoms: Secondary | ICD-10-CM | POA: Diagnosis not present

## 2019-05-28 DIAGNOSIS — M0510 Rheumatoid lung disease with rheumatoid arthritis of unspecified site: Secondary | ICD-10-CM | POA: Diagnosis not present

## 2019-05-28 LAB — GAMMA GT: GGT: 78 U/L — ABNORMAL HIGH (ref 7–50)

## 2019-05-29 NOTE — Discharge Summary (Addendum)
[place] [facility] [place] NAME: [name] Foster    MR#:  [id number]  DATE OF BIRTH:  06-16-1941  DATE OF ADMISSION:  05/25/2019   ADMITTING PHYSICIAN: Max Sane, MD  DATE OF DISCHARGE: 05/27/2019  2:18 PM  PRIMARY CARE PHYSICIAN: Cyndie Chime, MD   ADMISSION DIAGNOSIS:  Atypical pneumonia [J18.9] Sepsis, due to unspecified organism, unspecified whether acute organ dysfunction present ([place]) [A41.9] DISCHARGE DIAGNOSIS:  Active Problems:   Pneumonia   Sepsis ([place])   Goals of care, counseling/discussion   Palliative care by specialist   DNR (do not resuscitate) discussion   Atypical pneumonia   Iron deficiency anemia   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 Arthritis   . BPH (benign prostatic hyperplasia)   . Former smoker, stopped smoking many years ago   . History of needle biopsy of prostate with negative result 2013  . Hypertension   . Prostate enlargement   . Prostatic intraepithelial neoplasm III 2013   HOSPITAL COURSE:  Patient is a 78 y.o. male with history of autoimmune neutropenia, arthritis, knee replacement admitted for fever, chills, cough, generalized weakness  * Sepsis: present on admission. Likely due to viral bronchitis. CT chest indicates bronchial thickening.   * Pneumonia: ruled out. Procalcitonin normal which makes it unlikely bacterial etio - resp resp virus including influenza neg  * Anemia, he had iron panel done recently at [facility]. Low iron saturation, elevated Ferritin, which maybe falsely elevated due inflammation. retic hemoglobin low, indicating possible IDA. Recent start of Aspirin after knee replacement Last colonoscopy was +10 years ago.  - IV Iron was discussed with patient and daughter on 05/25/2019, and due to acute illness, Onco will proceed in outpatient setting. Patient and daughter agree with oral iron supplementation and follow up  outpatient.   * History of autoimmune neutropenia with positive anti-platelet antibody.   * ILD: outpt follow up with pulmonology. May need hi-res CT chest and PFT as an outpt - Evidence of pulmonary fibrosis versus inflammation versus interstitial changes.  Presentation would be a little atypical for methotrexate pneumonitis given the asymmetry  * Recent diagnosis of rheumatoid arthritis based on synovitis of his hands knees, elevated rheumatoid factor and markedly elevated inflammatory markers.  Status post 2 doses of 10 mg of methotrexate which he feels like he tolerated poorly with nonspecific symptoms.  No evidence of synovitis on this admission - outpt f/up with Dr.[facility]le.   * Decreased taste.  Weight loss.  Out of proportion to the above  * Generalized muscle weakness.  More proximal and lower extremities with absent reflexes.  Acute versus subacute.  He has gone back to having to use a walker - Patient and family are convinced this is secondary to methotrexate since his symptoms became an issue after its initiation.  This has now been discontinued. - Patient also with some infection and metabolic abnormalities that are being addressed.    - If strength does not return, further work up would be indicated to include myasthenia panel, CK NCV/EMG. - Outpt f/up with neurology in 3-4 weeks.    * Thrombocytosis.  Raises the question of an inflammatory illness.  * History of immune neutropenia.  Now with normal white count.  Did have 10% large granular lymphocytes on flow studies previously  * History of prostate cancer - outpt Onco f/up  * Elevated alkaline phosphatase.  Normal current transaminases though prior transaminases  were normal.  Liver is not thought to be abnormal on recent abdominal CT.  Suspect bony.  Did have recent hip replacement.  Versus other etiology.(No previous evidence of monoclonal gammopathy) pathology showed osteoarthritis  * Elevated d-dimer without  obvious pulmonary embolism or DVT  Left renal nephrolithiasis  Adrenal adenoma  Mild proteinuria  Osteoarthritis.  Knees.  Status post bilateral hip replacements.  Left shoulder DISCHARGE CONDITIONS:  stable CONSULTS OBTAINED:  Treatment Team:  Catarina Hartshorn, MD DRUG ALLERGIES:  No Known Allergies DISCHARGE MEDICATIONS:   Allergies as of 05/27/2019   No Known Allergies     Medication List    STOP taking these medications   docusate sodium 100 MG capsule Commonly known as: COLACE   folic acid 1 MG tablet Commonly known as: FOLVITE   HYDROcodone-acetaminophen 5-325 MG tablet Commonly known as: NORCO/VICODIN   levofloxacin 500 MG tablet Commonly known as: LEVAQUIN   methotrexate 2.5 MG tablet     TAKE these medications   benzonatate 200 MG capsule Commonly known as: TESSALON Take 200 mg by mouth 3 (three) times daily as needed for cough.   doxazosin 4 MG tablet Commonly known as: CARDURA Take 1 tablet (4 mg total) by mouth daily.   ferrous [id number] C-folic acid capsule Commonly known as: TRINSICON / FOLTRIN Take 1 capsule by mouth 2 (two) times daily.   finasteride 5 MG tablet Commonly known as: PROSCAR Take 1 tablet (5 mg total) by mouth daily.     ASK your doctor about these medications   aspirin 81 MG chewable tablet Chew 1 tablet (81 mg total) by mouth 2 (two) times daily for 30 days. Ask about: Should I take this medication?        DISCHARGE INSTRUCTIONS:   DIET:  Cardiac diet DISCHARGE CONDITION:  Good ACTIVITY:  Activity as tolerated OXYGEN:  Home Oxygen: No.  Oxygen Delivery: room air DISCHARGE LOCATION:  home with HHPT  If you experience worsening of your admission symptoms, develop shortness of breath, life threatening emergency, suicidal or homicidal thoughts you must seek medical attention immediately by calling 911 or calling your MD immediately  if symptoms less severe.  You Must read complete  instructions/literature along with all the possible adverse reactions/side effects for all the Medicines you take and that have been prescribed to you. Take any new Medicines after you have completely understood and accpet all the possible adverse reactions/side effects.   Please note  You were cared for by a hospitalist during your hospital stay. If you have any questions about your discharge medications or the care you received while you were in the hospital after you are discharged, you can call the unit and asked to speak with the hospitalist on call if the hospitalist that took care of you is not available. Once you are discharged, your primary care physician will handle any further medical issues. Please note that NO REFILLS for any discharge medications will be authorized once you are discharged, as it is imperative that you return to your primary care physician (or establish a relationship with a primary care physician if you do not have one) for your aftercare needs so that they can reassess your need for medications and monitor your lab values.    On the day of Discharge:  VITAL SIGNS:  Blood pressure 119/70, pulse 98, temperature 98.4 F (36.9 C), temperature source Oral, resp. rate 18, height 5\' 8"  (1.727 m), weight 74.4 kg, SpO2 95 %. PHYSICAL EXAMINATION:  GENERAL:  78  y.o.-year-old patient lying in the bed with no acute distress.  EYES: Pupils equal, round, reactive to light and accommodation. No scleral icterus. Extraocular muscles intact.  HEENT: Head atraumatic, normocephalic. Oropharynx and nasopharynx clear.  NECK:  Supple, no jugular venous distention. No thyroid enlargement, no tenderness.  LUNGS: Normal breath sounds bilaterally, no wheezing, rales,rhonchi or crepitation. No use of accessory muscles of respiration.  CARDIOVASCULAR: S1, S2 normal. No murmurs, rubs, or gallops.  ABDOMEN: Soft, non-tender, non-distended. Bowel sounds present. No organomegaly or mass.   EXTREMITIES: No pedal edema, cyanosis, or clubbing.  NEUROLOGIC: Cranial nerves II through XII are intact. Muscle strength 5/5 in all extremities. Sensation intact. Gait not checked.  PSYCHIATRIC: The patient is alert and oriented x 3.  SKIN: No obvious rash, lesion, or ulcer.  DATA REVIEW:   CBC Recent Labs  Lab 05/27/19 0803  WBC 9.5  HGB 8.0*  HCT 25.0*  PLT 424*    Chemistries  Recent Labs  Lab 05/24/19 2157  05/27/19 0803  NA 130*  --  130*  K 4.1  --  3.5  CL 94*  --  97*  CO2 28  --  27  GLUCOSE 173*  --  142*  BUN 16  --  6*  CREATININE 0.79   < > 0.56*  CALCIUM 8.0*  --  7.5*  AST 26  --   --   ALT 27  --   --   ALKPHOS 258*  --   --   BILITOT 0.8  --   --    < > = values in this interval not displayed.     Follow-up Information    Cyndie Chime, MD. Schedule an appointment as soon as possible for a visit in 5 day(s).   Specialty: Internal Medicine Contact information: [place] [address] [phone]        Jonathon Bellows, MD. Schedule an appointment as soon as possible for a visit in 1 week(s).   Specialty: Gastroenterology Contact information: [place] [address] [phone]        Vladimir Crofts, MD. Schedule an appointment as soon as possible for a visit in 1 month(s).   Specialty: Neurology Contact information: [facility] [place] [address] [phone]        Emmaline Kluver., MD. Schedule an appointment as soon as possible for a visit in 3 week(s).   Specialty: Rheumatology Contact information: [place] [facility] [place] [address] [phone]        Earlie Server, MD. Schedule an appointment as soon as possible for a visit in 2 week(s).   Specialty: Oncology Contact information: [place] [address] [phone]        Laverle Hobby, MD. Go on  06/07/2019.   Specialty: Pulmonary Disease Why: at 10:30am  Contact information: [place] [address] [phone]           Management plans discussed with the patient, family and they are in agreement.  CODE STATUS: Prior   TOTAL TIME TAKING CARE OF THIS PATIENT: 45 minutes.    Max Sane M.D on 05/29/2019 at 4:01 PM  Between 7am to 6pm - Pager - [phone]  After 6pm go to [email or web address] - Proofreader  [facility]  Office  [phone]  CC: Primary care physician; Cyndie Chime, MD   Note:  This dictation was prepared with Dragon dictation along with smaller phrase technology. Any transcriptional errors that result from this process are unintentional.

## 2019-05-30 LAB — CULTURE, BLOOD (ROUTINE X 2)
Culture: NO GROWTH
Culture: NO GROWTH
Special Requests: ADEQUATE
Special Requests: ADEQUATE

## 2019-05-30 LAB — IMMUNOFIXATION ELECTROPHORESIS
IgA: 347 mg/dL (ref 61–437)
IgG (Immunoglobin G), Serum: 1223 mg/dL (ref 603–1613)
IgM (Immunoglobulin M), Srm: 71 mg/dL (ref 15–143)
Total Protein ELP: 5.1 g/dL — ABNORMAL LOW (ref 6.0–8.5)

## 2019-05-30 LAB — IMMUNOFIXATION, URINE

## 2019-06-02 DIAGNOSIS — N401 Enlarged prostate with lower urinary tract symptoms: Secondary | ICD-10-CM | POA: Diagnosis not present

## 2019-06-02 DIAGNOSIS — D709 Neutropenia, unspecified: Secondary | ICD-10-CM | POA: Diagnosis not present

## 2019-06-02 DIAGNOSIS — M6281 Muscle weakness (generalized): Secondary | ICD-10-CM | POA: Diagnosis not present

## 2019-06-02 DIAGNOSIS — N400 Enlarged prostate without lower urinary tract symptoms: Secondary | ICD-10-CM | POA: Diagnosis not present

## 2019-06-02 DIAGNOSIS — J189 Pneumonia, unspecified organism: Secondary | ICD-10-CM | POA: Diagnosis not present

## 2019-06-02 DIAGNOSIS — I10 Essential (primary) hypertension: Secondary | ICD-10-CM | POA: Diagnosis not present

## 2019-06-02 DIAGNOSIS — Z515 Encounter for palliative care: Secondary | ICD-10-CM

## 2019-06-02 DIAGNOSIS — M059 Rheumatoid arthritis with rheumatoid factor, unspecified: Secondary | ICD-10-CM | POA: Diagnosis not present

## 2019-06-02 DIAGNOSIS — D509 Iron deficiency anemia, unspecified: Secondary | ICD-10-CM | POA: Diagnosis not present

## 2019-06-02 DIAGNOSIS — D3502 Benign neoplasm of left adrenal gland: Secondary | ICD-10-CM | POA: Diagnosis not present

## 2019-06-02 DIAGNOSIS — Z13220 Encounter for screening for lipoid disorders: Secondary | ICD-10-CM | POA: Diagnosis not present

## 2019-06-02 DIAGNOSIS — A419 Sepsis, unspecified organism: Secondary | ICD-10-CM | POA: Diagnosis not present

## 2019-06-02 DIAGNOSIS — D075 Carcinoma in situ of prostate: Secondary | ICD-10-CM | POA: Diagnosis not present

## 2019-06-02 DIAGNOSIS — R531 Weakness: Secondary | ICD-10-CM | POA: Diagnosis not present

## 2019-06-02 DIAGNOSIS — M0510 Rheumatoid lung disease with rheumatoid arthritis of unspecified site: Secondary | ICD-10-CM | POA: Diagnosis not present

## 2019-06-02 DIAGNOSIS — M160 Bilateral primary osteoarthritis of hip: Secondary | ICD-10-CM | POA: Diagnosis not present

## 2019-06-02 DIAGNOSIS — D708 Other neutropenia: Secondary | ICD-10-CM | POA: Diagnosis not present

## 2019-06-02 DIAGNOSIS — Z0000 Encounter for general adult medical examination without abnormal findings: Secondary | ICD-10-CM | POA: Diagnosis not present

## 2019-06-02 DIAGNOSIS — J208 Acute bronchitis due to other specified organisms: Secondary | ICD-10-CM | POA: Diagnosis not present

## 2019-06-02 DIAGNOSIS — Z96643 Presence of artificial hip joint, bilateral: Secondary | ICD-10-CM | POA: Diagnosis not present

## 2019-06-02 DIAGNOSIS — Z1321 Encounter for screening for nutritional disorder: Secondary | ICD-10-CM | POA: Diagnosis not present

## 2019-06-02 DIAGNOSIS — J431 Panlobular emphysema: Secondary | ICD-10-CM | POA: Insufficient documentation

## 2019-06-03 DIAGNOSIS — M6281 Muscle weakness (generalized): Secondary | ICD-10-CM | POA: Diagnosis not present

## 2019-06-03 DIAGNOSIS — I10 Essential (primary) hypertension: Secondary | ICD-10-CM | POA: Diagnosis not present

## 2019-06-03 DIAGNOSIS — N400 Enlarged prostate without lower urinary tract symptoms: Secondary | ICD-10-CM | POA: Diagnosis not present

## 2019-06-03 DIAGNOSIS — D709 Neutropenia, unspecified: Secondary | ICD-10-CM | POA: Diagnosis not present

## 2019-06-03 DIAGNOSIS — J208 Acute bronchitis due to other specified organisms: Secondary | ICD-10-CM | POA: Diagnosis not present

## 2019-06-03 DIAGNOSIS — D075 Carcinoma in situ of prostate: Secondary | ICD-10-CM | POA: Diagnosis not present

## 2019-06-03 DIAGNOSIS — D3502 Benign neoplasm of left adrenal gland: Secondary | ICD-10-CM | POA: Diagnosis not present

## 2019-06-03 DIAGNOSIS — M0510 Rheumatoid lung disease with rheumatoid arthritis of unspecified site: Secondary | ICD-10-CM | POA: Diagnosis not present

## 2019-06-03 DIAGNOSIS — D509 Iron deficiency anemia, unspecified: Secondary | ICD-10-CM | POA: Diagnosis not present

## 2019-06-05 DIAGNOSIS — R000 Tachycardia, unspecified: Secondary | ICD-10-CM | POA: Diagnosis not present

## 2019-06-05 DIAGNOSIS — M48061 Spinal stenosis, lumbar region without neurogenic claudication: Secondary | ICD-10-CM | POA: Diagnosis not present

## 2019-06-05 DIAGNOSIS — I493 Ventricular premature depolarization: Secondary | ICD-10-CM | POA: Diagnosis not present

## 2019-06-05 DIAGNOSIS — R531 Weakness: Secondary | ICD-10-CM | POA: Diagnosis not present

## 2019-06-05 DIAGNOSIS — M546 Pain in thoracic spine: Secondary | ICD-10-CM | POA: Diagnosis not present

## 2019-06-05 DIAGNOSIS — M4805 Spinal stenosis, thoracolumbar region: Secondary | ICD-10-CM | POA: Diagnosis not present

## 2019-06-05 DIAGNOSIS — E43 Unspecified severe protein-calorie malnutrition: Secondary | ICD-10-CM | POA: Diagnosis not present

## 2019-06-05 DIAGNOSIS — D509 Iron deficiency anemia, unspecified: Secondary | ICD-10-CM | POA: Diagnosis not present

## 2019-06-05 DIAGNOSIS — Z136 Encounter for screening for cardiovascular disorders: Secondary | ICD-10-CM | POA: Diagnosis not present

## 2019-06-05 DIAGNOSIS — M47816 Spondylosis without myelopathy or radiculopathy, lumbar region: Secondary | ICD-10-CM | POA: Diagnosis not present

## 2019-06-05 DIAGNOSIS — R627 Adult failure to thrive: Secondary | ICD-10-CM | POA: Diagnosis not present

## 2019-06-06 ENCOUNTER — Telehealth: Payer: Self-pay | Admitting: Internal Medicine

## 2019-06-06 DIAGNOSIS — M6281 Muscle weakness (generalized): Secondary | ICD-10-CM | POA: Diagnosis not present

## 2019-06-06 DIAGNOSIS — J208 Acute bronchitis due to other specified organisms: Secondary | ICD-10-CM | POA: Diagnosis not present

## 2019-06-06 DIAGNOSIS — D509 Iron deficiency anemia, unspecified: Secondary | ICD-10-CM | POA: Diagnosis not present

## 2019-06-06 DIAGNOSIS — N400 Enlarged prostate without lower urinary tract symptoms: Secondary | ICD-10-CM | POA: Diagnosis not present

## 2019-06-06 DIAGNOSIS — D3502 Benign neoplasm of left adrenal gland: Secondary | ICD-10-CM | POA: Diagnosis not present

## 2019-06-06 DIAGNOSIS — D075 Carcinoma in situ of prostate: Secondary | ICD-10-CM | POA: Diagnosis not present

## 2019-06-06 DIAGNOSIS — M546 Pain in thoracic spine: Secondary | ICD-10-CM | POA: Diagnosis not present

## 2019-06-06 DIAGNOSIS — M0510 Rheumatoid lung disease with rheumatoid arthritis of unspecified site: Secondary | ICD-10-CM | POA: Diagnosis not present

## 2019-06-06 DIAGNOSIS — D709 Neutropenia, unspecified: Secondary | ICD-10-CM | POA: Diagnosis not present

## 2019-06-06 DIAGNOSIS — M47816 Spondylosis without myelopathy or radiculopathy, lumbar region: Secondary | ICD-10-CM | POA: Diagnosis not present

## 2019-06-06 DIAGNOSIS — I10 Essential (primary) hypertension: Secondary | ICD-10-CM | POA: Diagnosis not present

## 2019-06-06 NOTE — Progress Notes (Signed)
* [facility]     Assessment and Plan:  ILD. 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  --Likely related to underlying RA. Doubt secondary to methotrexate, as the patient had only received 2 doses, and some of the ILD changes were seen on earlier CT AP from 2011.  --Recommend continued treatment of RA.  --At this time patient appears weak, with persistent weight loss of uncertain etiology, and is going to see hematology/oncology for further work-up.  Given his further weakness I will hold off on further pulmonary function testing and CT high-resolution at this time, as it is unlikely to change outcomes in the immediate future.  Can consider ordering at next visit.  Emphysema. - Bulla seen on CT chest, unchanged from previous CT chest in 2011.  Patient has a previous history of smoking but quit several decades ago. - No significant respiratory failure dyspnea at this time, no need for chronic inhalers.   Return in about 9 months (around 03/07/2020).    Date: 06/07/2019  MRN# [id number] [name] Foster May 15, 1941   [name] Foster is a 78 y.o. old male seen in follow up for chief complaint of  Chief Complaint  Patient presents with  . Hospitalization Follow-up    discharged on 05/27/2019- breathing is baseline since discharge. c/o mild prod cough with clear mucus and sob with exertion.     HPI:  [name] Foster is a 78 y.o. male admitted to the hospital 05/25/2019 from his facility with progressive weakness and dry cough.  He was noted to have a fever of as high as 101.5 and treated for pneumonia at the facility, he had been tested twice for COVID-19 but reportedly both of those tests were negative. Patient was noted to be on methotrexate, for rheumatoid arthritis which seemed to be before the weakness started, and the patient had also lost about 20 pounds.  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 Patient was also seen by rheumatology and noted to have taken only 2 days of weekly methotrexate, 10 mg each.  I saw him in consultation in the hospital due to findings of ILD seen on most recent imaging.  He was noted to have a history of living in a tobacco for most of his life, as well as the above history of rheumatoid arthritis.  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  Since his discharged he has been persistently tired with further weight loss, and LE weakness. He was taken to 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 He was asked to follow up with ortho and hematology.  He denies trouble breathing.   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  Mild subcarinal and hilar lymphadenopathy.  The dependent changes appear to be worse since previous CT chest on 06/25/2010.   Medication:    Current Outpatient Medications:  .  benzonatate (TESSALON) 200 MG capsule, Take 200 mg by mouth 3 (three) times daily as needed for cough., Disp: , Rfl:  .  doxazosin (CARDURA) 4 MG tablet, Take 1 tablet (4 mg total) by mouth daily., Disp: 90 tablet, Rfl: 3 .  ferrous [id number] C-folic acid (TRINSICON / FOLTRIN) capsule, Take 1 capsule by mouth 2 (two) times daily., Disp: 20 capsule, Rfl: 0 .  finasteride (PROSCAR) 5 MG tablet, Take 1 tablet (5 mg total) by mouth daily., Disp: 90 tablet, Rfl: 3   Allergies:  Patient has no known allergies.  Review of Systems:  Constitutional: Feels well. Cardiovascular: Denies chest pain, exertional chest pain.  Pulmonary: Denies hemoptysis, pleuritic chest pain.   The remainder of systems were reviewed and were found to be negative other than what is documented in the HPI.    Physical Examination:    VS: BP (!) 112/58 (BP Location: Left Arm, Cuff Size: Normal)   Pulse (!) 109   Temp 98 F (36.7 C) (Temporal)   Ht 5\' 8"  (1.727 m)   Wt 154 lb (69.9 kg)   SpO2 97%   BMI 23.42 kg/m   General Appearance: No distress  Neuro:without focal findings, mental status, speech normal, alert and oriented HEENT: PERRLA, EOM intact Pulmonary: No wheezing, No rales  CardiovascularNormal S1,S2.  No m/r/g.  Abdomen: Benign, Soft, non-tender, No masses Renal:  No costovertebral tenderness  GU:  No performed at this time. Endoc: No evident thyromegaly, no signs of acromegaly or Cushing features Skin:   warm, no rashes, no ecchymosis  Extremities: normal, no cyanosis, clubbing.      LABORATORY PANEL:   CBC No results for input(s): WBC, HGB, HCT, PLT in the last 168 hours. ------------------------------------------------------------------------------------------------------------------  Chemistries  No results for input(s): NA, K, CL, CO2, GLUCOSE, BUN, CREATININE, CALCIUM, MG, AST, ALT, ALKPHOS, BILITOT in the last 168 hours.  Invalid input(s): GFRCGP ------------------------------------------------------------------------------------------------------------------  Cardiac Enzymes No results for input(s): TROPONINI in the last 168 hours. ------------------------------------------------------------  RADIOLOGY:   No results found for this or any previous visit. No results found for this or any previous visit. ------------------------------------------------------------------------------------------------------------------  Thank  you for allowing [facility] to assist in the care of your patient. Our recommendations are noted above.  Please contact us if we can be of further service.   Marda Stalker, M.D., F.C.C.P.  Board Certified in Internal Medicine, Pulmonary Medicine, [place], and Sleep Medicine.  [facility]  Office Number: [phone]  06/07/2019

## 2019-06-06 NOTE — Telephone Encounter (Signed)
Called patient for COVID-19 pre-screening for in office visit. ° °Have you recently traveled any where out of the local area in the last 2 weeks? No ° °Have you been in close contact with a person diagnosed with COVID-19 or someone awaiting results within the last 2 weeks? No ° °Do you currently have any of the following symptoms? If so, when did they start? °Cough     Diarrhea   Joint Pain °Fever      Muscle Pain   Red eyes °Shortness of breath   Abdominal pain  Vomiting °Loss of smell    Rash    Sore Throat °Headache    Weakness   Bruising or bleeding ° ° °Okay to proceed with visit 06/07/2019  ° ° °

## 2019-06-07 ENCOUNTER — Ambulatory Visit (INDEPENDENT_AMBULATORY_CARE_PROVIDER_SITE_OTHER): Payer: Medicare HMO | Admitting: Internal Medicine

## 2019-06-07 ENCOUNTER — Encounter: Payer: Self-pay | Admitting: Internal Medicine

## 2019-06-07 ENCOUNTER — Other Ambulatory Visit: Payer: Self-pay

## 2019-06-07 VITALS — BP 112/58 | HR 109 | Temp 98.0°F | Ht 68.0 in | Wt 154.0 lb

## 2019-06-07 DIAGNOSIS — J849 Interstitial pulmonary disease, unspecified: Secondary | ICD-10-CM

## 2019-06-07 DIAGNOSIS — J208 Acute bronchitis due to other specified organisms: Secondary | ICD-10-CM | POA: Diagnosis not present

## 2019-06-07 DIAGNOSIS — J438 Other emphysema: Secondary | ICD-10-CM | POA: Diagnosis not present

## 2019-06-07 DIAGNOSIS — D509 Iron deficiency anemia, unspecified: Secondary | ICD-10-CM | POA: Diagnosis not present

## 2019-06-07 DIAGNOSIS — D709 Neutropenia, unspecified: Secondary | ICD-10-CM | POA: Diagnosis not present

## 2019-06-07 DIAGNOSIS — D3502 Benign neoplasm of left adrenal gland: Secondary | ICD-10-CM | POA: Diagnosis not present

## 2019-06-07 DIAGNOSIS — N400 Enlarged prostate without lower urinary tract symptoms: Secondary | ICD-10-CM | POA: Diagnosis not present

## 2019-06-07 DIAGNOSIS — M0510 Rheumatoid lung disease with rheumatoid arthritis of unspecified site: Secondary | ICD-10-CM | POA: Diagnosis not present

## 2019-06-07 DIAGNOSIS — M6281 Muscle weakness (generalized): Secondary | ICD-10-CM | POA: Diagnosis not present

## 2019-06-07 DIAGNOSIS — D075 Carcinoma in situ of prostate: Secondary | ICD-10-CM | POA: Diagnosis not present

## 2019-06-07 DIAGNOSIS — I10 Essential (primary) hypertension: Secondary | ICD-10-CM | POA: Diagnosis not present

## 2019-06-07 NOTE — Patient Instructions (Signed)
Return to our office in 9 months.  Call sooner if needed.

## 2019-06-08 DIAGNOSIS — D509 Iron deficiency anemia, unspecified: Secondary | ICD-10-CM | POA: Diagnosis not present

## 2019-06-08 DIAGNOSIS — D075 Carcinoma in situ of prostate: Secondary | ICD-10-CM | POA: Diagnosis not present

## 2019-06-08 DIAGNOSIS — M6281 Muscle weakness (generalized): Secondary | ICD-10-CM | POA: Diagnosis not present

## 2019-06-08 DIAGNOSIS — M0510 Rheumatoid lung disease with rheumatoid arthritis of unspecified site: Secondary | ICD-10-CM | POA: Diagnosis not present

## 2019-06-08 DIAGNOSIS — J208 Acute bronchitis due to other specified organisms: Secondary | ICD-10-CM | POA: Diagnosis not present

## 2019-06-08 DIAGNOSIS — D3502 Benign neoplasm of left adrenal gland: Secondary | ICD-10-CM | POA: Diagnosis not present

## 2019-06-08 DIAGNOSIS — M1611 Unilateral primary osteoarthritis, right hip: Secondary | ICD-10-CM | POA: Diagnosis not present

## 2019-06-08 DIAGNOSIS — N400 Enlarged prostate without lower urinary tract symptoms: Secondary | ICD-10-CM | POA: Diagnosis not present

## 2019-06-08 DIAGNOSIS — D709 Neutropenia, unspecified: Secondary | ICD-10-CM | POA: Diagnosis not present

## 2019-06-08 DIAGNOSIS — I10 Essential (primary) hypertension: Secondary | ICD-10-CM | POA: Diagnosis not present

## 2019-06-09 DIAGNOSIS — J208 Acute bronchitis due to other specified organisms: Secondary | ICD-10-CM | POA: Diagnosis not present

## 2019-06-09 DIAGNOSIS — D3502 Benign neoplasm of left adrenal gland: Secondary | ICD-10-CM | POA: Diagnosis not present

## 2019-06-09 DIAGNOSIS — D075 Carcinoma in situ of prostate: Secondary | ICD-10-CM | POA: Diagnosis not present

## 2019-06-09 DIAGNOSIS — M0510 Rheumatoid lung disease with rheumatoid arthritis of unspecified site: Secondary | ICD-10-CM | POA: Diagnosis not present

## 2019-06-09 DIAGNOSIS — I10 Essential (primary) hypertension: Secondary | ICD-10-CM | POA: Diagnosis not present

## 2019-06-09 DIAGNOSIS — D709 Neutropenia, unspecified: Secondary | ICD-10-CM | POA: Diagnosis not present

## 2019-06-09 DIAGNOSIS — M6281 Muscle weakness (generalized): Secondary | ICD-10-CM | POA: Diagnosis not present

## 2019-06-09 DIAGNOSIS — N400 Enlarged prostate without lower urinary tract symptoms: Secondary | ICD-10-CM | POA: Diagnosis not present

## 2019-06-09 DIAGNOSIS — D509 Iron deficiency anemia, unspecified: Secondary | ICD-10-CM | POA: Diagnosis not present

## 2019-06-10 ENCOUNTER — Inpatient Hospital Stay (HOSPITAL_BASED_OUTPATIENT_CLINIC_OR_DEPARTMENT_OTHER): Payer: Medicare HMO | Admitting: Oncology

## 2019-06-10 ENCOUNTER — Inpatient Hospital Stay: Payer: Medicare HMO | Admitting: Oncology

## 2019-06-10 ENCOUNTER — Other Ambulatory Visit: Payer: Self-pay

## 2019-06-10 ENCOUNTER — Inpatient Hospital Stay: Payer: Medicare HMO | Attending: Oncology

## 2019-06-10 ENCOUNTER — Inpatient Hospital Stay: Payer: Medicare HMO

## 2019-06-10 ENCOUNTER — Encounter: Payer: Self-pay | Admitting: Oncology

## 2019-06-10 VITALS — BP 120/71 | HR 107 | Temp 97.8°F | Ht 68.0 in | Wt 154.0 lb

## 2019-06-10 DIAGNOSIS — R634 Abnormal weight loss: Secondary | ICD-10-CM

## 2019-06-10 DIAGNOSIS — D649 Anemia, unspecified: Secondary | ICD-10-CM

## 2019-06-10 DIAGNOSIS — D72829 Elevated white blood cell count, unspecified: Secondary | ICD-10-CM | POA: Diagnosis not present

## 2019-06-10 DIAGNOSIS — M0510 Rheumatoid lung disease with rheumatoid arthritis of unspecified site: Secondary | ICD-10-CM | POA: Diagnosis not present

## 2019-06-10 DIAGNOSIS — R748 Abnormal levels of other serum enzymes: Secondary | ICD-10-CM | POA: Diagnosis not present

## 2019-06-10 DIAGNOSIS — M059 Rheumatoid arthritis with rheumatoid factor, unspecified: Secondary | ICD-10-CM

## 2019-06-10 DIAGNOSIS — D473 Essential (hemorrhagic) thrombocythemia: Secondary | ICD-10-CM | POA: Insufficient documentation

## 2019-06-10 DIAGNOSIS — D075 Carcinoma in situ of prostate: Secondary | ICD-10-CM | POA: Diagnosis not present

## 2019-06-10 DIAGNOSIS — I10 Essential (primary) hypertension: Secondary | ICD-10-CM | POA: Diagnosis not present

## 2019-06-10 DIAGNOSIS — D509 Iron deficiency anemia, unspecified: Secondary | ICD-10-CM

## 2019-06-10 DIAGNOSIS — N400 Enlarged prostate without lower urinary tract symptoms: Secondary | ICD-10-CM | POA: Diagnosis not present

## 2019-06-10 DIAGNOSIS — Z87891 Personal history of nicotine dependence: Secondary | ICD-10-CM | POA: Insufficient documentation

## 2019-06-10 DIAGNOSIS — M6281 Muscle weakness (generalized): Secondary | ICD-10-CM | POA: Diagnosis not present

## 2019-06-10 DIAGNOSIS — D709 Neutropenia, unspecified: Secondary | ICD-10-CM | POA: Diagnosis not present

## 2019-06-10 DIAGNOSIS — J208 Acute bronchitis due to other specified organisms: Secondary | ICD-10-CM | POA: Diagnosis not present

## 2019-06-10 DIAGNOSIS — D708 Other neutropenia: Secondary | ICD-10-CM

## 2019-06-10 DIAGNOSIS — D3502 Benign neoplasm of left adrenal gland: Secondary | ICD-10-CM | POA: Diagnosis not present

## 2019-06-10 LAB — CBC WITH DIFFERENTIAL/PLATELET
Abs Immature Granulocytes: 0.17 10*3/uL — ABNORMAL HIGH (ref 0.00–0.07)
Basophils Absolute: 0 10*3/uL (ref 0.0–0.1)
Basophils Relative: 0 %
Eosinophils Absolute: 0 10*3/uL (ref 0.0–0.5)
Eosinophils Relative: 0 %
HCT: 27.5 % — ABNORMAL LOW (ref 39.0–52.0)
Hemoglobin: 8.5 g/dL — ABNORMAL LOW (ref 13.0–17.0)
Immature Granulocytes: 1 %
Lymphocytes Relative: 10 %
Lymphs Abs: 1.2 10*3/uL (ref 0.7–4.0)
MCH: 25.2 pg — ABNORMAL LOW (ref 26.0–34.0)
MCHC: 30.9 g/dL (ref 30.0–36.0)
MCV: 81.6 fL (ref 80.0–100.0)
Monocytes Absolute: 0.9 10*3/uL (ref 0.1–1.0)
Monocytes Relative: 7 %
Neutro Abs: 9.9 10*3/uL — ABNORMAL HIGH (ref 1.7–7.7)
Neutrophils Relative %: 82 %
Platelets: 804 10*3/uL — ABNORMAL HIGH (ref 150–400)
RBC: 3.37 MIL/uL — ABNORMAL LOW (ref 4.22–5.81)
RDW: 16 % — ABNORMAL HIGH (ref 11.5–15.5)
WBC: 12.2 10*3/uL — ABNORMAL HIGH (ref 4.0–10.5)
nRBC: 0 % (ref 0.0–0.2)

## 2019-06-10 LAB — SAMPLE TO BLOOD BANK

## 2019-06-10 LAB — FERRITIN: Ferritin: 491 ng/mL — ABNORMAL HIGH (ref 24–336)

## 2019-06-10 LAB — RETIC PANEL
Immature Retic Fract: 14.8 % (ref 2.3–15.9)
RBC.: 3.35 MIL/uL — ABNORMAL LOW (ref 4.22–5.81)
Retic Count, Absolute: 76.4 10*3/uL (ref 19.0–186.0)
Retic Ct Pct: 2.3 % (ref 0.4–3.1)
Reticulocyte Hemoglobin: 26.7 pg — ABNORMAL LOW (ref >27.9)

## 2019-06-10 LAB — IRON AND TIBC
Iron: 15 ug/dL — ABNORMAL LOW (ref 45–182)
Saturation Ratios: 12 % — ABNORMAL LOW (ref 17.9–39.5)
TIBC: 130 ug/dL — ABNORMAL LOW (ref 250–450)
UIBC: 115 ug/dL

## 2019-06-10 NOTE — Progress Notes (Signed)
Patient stated that he had been doing ok.

## 2019-06-11 NOTE — Addendum Note (Signed)
Addended by: Earlie Server on: 06/11/2019 08:11 PM   Modules accepted: Orders

## 2019-06-11 NOTE — Progress Notes (Addendum)
[facility] Follow up Visit:  Patient Care Team: Cyndie Chime, MD as PCP - General (Internal Medicine)  REASON FOR VISIT Follow up for mangement of neutropenia.   HISTORY OF PRESENTING ILLNESS: [name] Foster 78 y.o. 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  Patient was accompanied by his wife today. Patient denies any complaint he fails at his baseline health. Denies any night sweats or fever or weight loss. Denies any chest pain, shortness of breath, abdominal pain, or leg swelling. He denies any frequent upper respiratory infections. Patient had some recent labs done with [facility].  he has left shoulder pain for which he had seen PCP and was recommended to see orthopedic surgeon. patient declined. He was then advised to start shoulder exercise and be reassessed.  05/14/2017, hemoglobin 12.9 WBC 2.8 MCV 89.5 platelet 207. ANC 0.9Creatinine 1 bilirubin 0.6 AST 25 ALT 27, alkaline phosphatase In the past,his wbc has been chronically low, ranges from 12.9-13.3. ANC ranges from 0.9-1.9.  # Work up negative including pathology smear review, ANA, HIV, TSH, B12,  # his neutrophil counts was monitored weekly for the past few weeks. Patient's ANC ranges from 1.0-1.4. He has no signs of frequent infection. 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  INTERVAL HISTORY Patient with above history reviewed by me today presents for posthospitalization follow-up. Patient was recently hospitalized from 05/25/2019 - 05/27/2019 due to sepsis secondary to viral bronchitis. Pneumonia was ruled out.  Procalcitonin normal. He was found to have low iron saturation, elevated ferritin.  Patient was started on aspirin recently after his knee replacement.  IV iron was discussed with patient and daughter on 05/25/2019.  Due to the acute illness, I recommend patient to  follow-up outpatient.  Currently he is on oral iron supplementation. 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  Patient follows up with pulmonology.  Patient continues to feel fatigued and tired.  No appetite due to decreased taste.  Patient has lost 15 pounds since September 2019.   Review of Systems  Constitutional: Positive for appetite change, fatigue and unexpected weight change. Negative for chills, diaphoresis and fever.  HENT:   Negative for hearing loss, lump/mass, nosebleeds and sore throat.   Eyes: Negative for eye problems and icterus.  Respiratory: Negative for chest tightness, cough, hemoptysis, shortness of breath and wheezing.   Cardiovascular: Negative for chest pain and leg swelling.  Gastrointestinal: Negative for abdominal distention, abdominal pain, blood in stool, diarrhea, nausea and rectal pain.  Endocrine: Negative for hot flashes.  Genitourinary: Negative for bladder incontinence, difficulty urinating, dysuria, frequency, hematuria and nocturia.   Musculoskeletal: Negative for arthralgias, back pain, flank pain, gait problem and myalgias.       Generalized muscle weakness  Skin: Negative for rash.  Neurological: Negative for dizziness, gait problem, headaches, numbness and seizures.  Hematological: Negative for adenopathy. Does not bruise/bleed easily.  Psychiatric/Behavioral: Negative for confusion and decreased concentration. The patient is not nervous/anxious.     MEDICAL HISTORY: Past Medical History:  Diagnosis Date  . Arthritis   . BPH (benign prostatic hyperplasia)   . Former smoker, stopped smoking many years ago   . History of needle biopsy of prostate with negative result 2013  . Hypertension   . Prostate enlargement   . Prostatic intraepithelial neoplasm III 2013    SURGICAL HISTORY: Past Surgical History:  Procedure Laterality Date  . HEMORROIDECTOMY    . HERNIA REPAIR    . JOINT REPLACEMENT Left     THR  . PROSTATE BIOPSY  2013  . TOTAL HIP ARTHROPLASTY Left   . TOTAL HIP ARTHROPLASTY Right 04/26/2019   Procedure: TOTAL HIP ARTHROPLASTY ANTERIOR APPROACH;  Surgeon: Hessie Knows, MD;  Location: [facility] ORS;  Service: Orthopedics;  Laterality: Right;    SOCIAL HISTORY: Social History   Socioeconomic History  . Marital status: Married    Spouse name: Not on file  . Number of children: Not on file  . Years of education: Not on file  . Highest education level: Not on file  Occupational History  . Not on file  Social Needs  . Financial resource strain: Not on file  . Food insecurity    Worry: Not on file    Inability: Not on file  . Transportation needs    Medical: Not on file    Non-medical: Not on file  Tobacco Use  . Smoking status: Former Smoker    Quit date: 06/04/1980    Years since quitting: 39.0  . Smokeless tobacco: Never Used  Substance and Sexual Activity  . Alcohol use: No  . Drug use: No  . Sexual activity: Yes  Lifestyle  . Physical activity    Days per week: Not on file    Minutes per session: Not on file  . Stress: Not on file  Relationships  . Social Herbalist on phone: Not on file    Gets together: Not on file    Attends religious service: Not on file    Active member of club or organization: Not on file    Attends meetings of clubs or organizations: Not on file    Relationship status: Not on file  . Intimate partner violence    Fear of current or ex partner: Not on file    Emotionally abused: Not on file    Physically abused: Not on file    Forced sexual activity: Not on file  Other Topics Concern  . Not on file  Social History Narrative  . Not on file    FAMILY HISTORY Family History  Problem Relation Age of Onset  . Prostate cancer Father   . Cancer Father   . Prostate cancer Brother   . Cancer Mother     ALLERGIES:  is allergic to methotrexate.  MEDICATIONS:  Current Outpatient Medications  Medication Sig Dispense  Refill  . albuterol (VENTOLIN HFA) 108 (90 Base) MCG/ACT inhaler Inhale 2 puffs into the lungs every 6 (six) hours as needed.    Marland Kitchen amoxicillin-clavulanate (AUGMENTIN) 875-125 MG tablet Take 1 tablet by mouth 2 (two) times a day.    Marland Kitchen azithromycin (ZITHROMAX) 250 MG tablet Take 1 tablet by mouth 1 day or 1 dose.    Marland Kitchen doxazosin (CARDURA) 4 MG tablet Take 1 tablet (4 mg total) by mouth daily. 90 tablet 3  . Ensure Max Protein (ENSURE MAX PROTEIN) LIQD Take 1 Bottle by mouth 2 (two) times a day.    . ferrous [id number] C-folic acid (TRINSICON / FOLTRIN) capsule Take 1 capsule by mouth 2 (two) times daily. 20 capsule 0  . finasteride (PROSCAR) 5 MG tablet Take 1 tablet (5 mg total) by mouth daily. 90 tablet 3  . Fluticasone-Salmeterol (ADVAIR) 250-50 MCG/DOSE AEPB Inhale 1 puff into the lungs 2 (two) times a day.    . levofloxacin (LEVAQUIN) 500 MG tablet Take 1  tablet by mouth 1 day or 1 dose.    . ORAL ELECTROLYTES PO Take 8 oz by mouth 1 day or 1 dose.     No current facility-administered medications for this visit.     PHYSICAL EXAMINATION:  ECOG PERFORMANCE STATUS: 0 - Asymptomatic   Vitals:   06/10/19 1324  BP: 120/71  Pulse: (!) 107  Temp: 97.8 F (36.6 C)    Filed Weights   06/10/19 1324  Weight: 154 lb (69.9 kg)   Physical Exam  Constitutional: He is oriented to person, place, and time. No distress.  Ill appearance  HENT:  Head: Normocephalic and atraumatic.  Mouth/Throat: No oropharyngeal exudate.  Eyes: Pupils are equal, round, and reactive to light. No scleral icterus.  Neck: Normal range of motion. Neck supple.  Cardiovascular: Normal rate, regular rhythm and normal heart sounds.  No murmur heard. Pulmonary/Chest: Effort normal. No respiratory distress. He exhibits no tenderness.  Bibasilar crackles  Abdominal: Soft. Bowel sounds are normal. He exhibits no distension and no mass. There is no abdominal tenderness. There is no guarding.  Musculoskeletal:  Normal range of motion.        General: No edema.  Lymphadenopathy:    He has no cervical adenopathy.  Neurological: He is alert and oriented to person, place, and time.  Skin: Skin is warm and dry. He is not diaphoretic. No erythema. There is pallor.  Psychiatric: Affect normal.     LABORATORY DATA: I have personally reviewed the data as listed:  Orders Only on 06/10/2019  Component Date Value Ref Range Status  . Blood Bank Specimen 06/10/2019 SAMPLE AVAILABLE FOR TESTING   Final  . Sample Expiration 06/10/2019    Final                   Value:06/13/2019,2359 Performed at [facility], [address], [place], [place] [address]   . Ferritin 06/10/2019 491* 24 - 336 ng/mL Final   Performed at [facility], [address] [place], [place] [address]  . Iron 06/10/2019 15* 45 - 182 ug/dL Final  . TIBC 06/10/2019 130* 250 - 450 ug/dL Final  . Saturation Ratios 06/10/2019 12* 17.9 - 39.5 % Final  . UIBC 06/10/2019 115  ug/dL Final   Performed at [facility], [address] [place], [place] [address]  . Retic Ct Pct 06/10/2019 2.3  0.4 - 3.1 % Final  . RBC. 06/10/2019 3.35* 4.22 - 5.81 MIL/uL Final  . Retic Count, Absolute 06/10/2019 76.4  19.0 - 186.0 K/uL Final  . Immature Retic Fract 06/10/2019 14.8  2.3 - 15.9 % Final  . Reticulocyte Hemoglobin 06/10/2019 26.7* >27.9 pg Final   Comment:        A RET-He < 28 pg is an indication of iron-deficient or iron- insufficient erythropoiesis. Patients with thalassemia may also have a decreased RET-He result unrelated to iron availability.     If this patient has chronic kidney disease and does not have a hemoglobinopathy he/she meets criteria for iron deficiency per the 2016 NICE guidelines. Refer to specific guidelines to determine the appropriate thresholds for treating CKD- associated iron deficiency. TSAT and ferritin should be used in patients with hemoglobinopathies (e.g.  thalassemia). Performed at [facility], [address] [place],  [address]   . WBC 06/10/2019 12.2* 4.0 - 10.5 K/uL Final  . RBC 06/10/2019 3.37* 4.22 - 5.81 MIL/uL Final  . Hemoglobin 06/10/2019 8.5* 13.0 - 17.0 g/dL Final  . HCT  06/10/2019 27.5* 39.0 - 52.0 % Final  . MCV 06/10/2019 81.6  80.0 - 100.0 fL Final  . MCH 06/10/2019 25.2* 26.0 - 34.0 pg Final  . MCHC 06/10/2019 30.9  30.0 - 36.0 g/dL Final  . RDW 06/10/2019 16.0* 11.5 - 15.5 % Final  . Platelets 06/10/2019 804* 150 - 400 K/uL Final  . nRBC 06/10/2019 0.0  0.0 - 0.2 % Final  . Neutrophils Relative % 06/10/2019 82  % Final  . Neutro Abs 06/10/2019 9.9* 1.7 - 7.7 K/uL Final  . Lymphocytes Relative 06/10/2019 10  % Final  . Lymphs Abs 06/10/2019 1.2  0.7 - 4.0 K/uL Final  . Monocytes Relative 06/10/2019 7  % Final  . Monocytes Absolute 06/10/2019 0.9  0.1 - 1.0 K/uL Final  . Eosinophils Relative 06/10/2019 0  % Final  . Eosinophils Absolute 06/10/2019 0.0  0.0 - 0.5 K/uL Final  . Basophils Relative 06/10/2019 0  % Final  . Basophils Absolute 06/10/2019 0.0  0.0 - 0.1 K/uL Final  . Immature Granulocytes 06/10/2019 1  % Final  . Abs Immature Granulocytes 06/10/2019 0.17* 0.00 - 0.07 K/uL Final   Performed at [facility], [address], [place], [place] [address]  Admission on 05/25/2019, Discharged on 05/27/2019  Component Date Value Ref Range Status  . Lactic Acid, Venous 05/24/2019 1.0  0.5 - 1.9 mmol/L Final   Performed at [facility], [address] [place], [place] [address]  . Sodium 05/24/2019 130* 135 - 145 mmol/L Final  . Potassium 05/24/2019 4.1  3.5 - 5.1 mmol/L Final  . Chloride 05/24/2019 94* 98 - 111 mmol/L Final  . CO2 05/24/2019 28  22 - 32 mmol/L Final  . Glucose, Bld 05/24/2019 173* 70 - 99 mg/dL Final  . BUN 05/24/2019 16  8 - 23 mg/dL Final  . Creatinine, Ser 05/24/2019 0.79  0.61 - 1.24 mg/dL Final  . Calcium 05/24/2019 8.0* 8.9 - 10.3 mg/dL Final  . Total Protein  05/24/2019 6.7  6.5 - 8.1 g/dL Final  . Albumin 05/24/2019 2.1* 3.5 - 5.0 g/dL Final  . AST 05/24/2019 26  15 - 41 U/L Final  . ALT 05/24/2019 27  0 - 44 U/L Final  . Alkaline Phosphatase 05/24/2019 258* 38 - 126 U/L Final  . Total Bilirubin 05/24/2019 0.8  0.3 - 1.2 mg/dL Final  . GFR calc non Af Amer 05/24/2019 >60  >60 mL/min Final  . GFR calc Af Amer 05/24/2019 >60  >60 mL/min Final  . Anion gap 05/24/2019 8  5 - 15 Final   Performed at [facility], [address], [place], [place] [address]  . WBC 05/24/2019 11.0* 4.0 - 10.5 K/uL Final  . RBC 05/24/2019 3.41* 4.22 - 5.81 MIL/uL Final  . Hemoglobin 05/24/2019 9.0* 13.0 - 17.0 g/dL Final  . HCT 05/24/2019 28.3* 39.0 - 52.0 % Final  . MCV 05/24/2019 83.0  80.0 - 100.0 fL Final  . MCH 05/24/2019 26.4  26.0 - 34.0 pg Final  . MCHC 05/24/2019 31.8  30.0 - 36.0 g/dL Final  . RDW 05/24/2019 15.3  11.5 - 15.5 % Final  . Platelets 05/24/2019 575* 150 - 400 K/uL Final  . nRBC 05/24/2019 0.0  0.0 - 0.2 % Final  . Neutrophils Relative % 05/24/2019 71  % Final  . Neutro Abs 05/24/2019 7.9* 1.7 - 7.7 K/uL Final  . Lymphocytes Relative 05/24/2019 19  % Final  . Lymphs Abs 05/24/2019 2.1  0.7 - 4.0  K/uL Final  . Monocytes Relative 05/24/2019 8  % Final  . Monocytes Absolute 05/24/2019 0.9  0.1 - 1.0 K/uL Final  . Eosinophils Relative 05/24/2019 1  % Final  . Eosinophils Absolute 05/24/2019 0.1  0.0 - 0.5 K/uL Final  . Basophils Relative 05/24/2019 0  % Final  . Basophils Absolute 05/24/2019 0.0  0.0 - 0.1 K/uL Final  . Immature Granulocytes 05/24/2019 1  % Final  . Abs Immature Granulocytes 05/24/2019 0.06  0.00 - 0.07 K/uL Final   Performed at [facility], [address] [place], [place] [address]  . Procalcitonin 05/24/2019 0.19  ng/mL Final   Comment:        Interpretation: PCT (Procalcitonin) <= 0.5 ng/mL: Systemic infection (sepsis) is not likely. Local bacterial infection is possible. (NOTE)       Sepsis PCT  Algorithm           Lower Respiratory Tract                                      Infection PCT Algorithm    ----------------------------     ----------------------------         PCT < 0.25 ng/mL                PCT < 0.10 ng/mL         Strongly encourage             Strongly discourage   discontinuation of antibiotics    initiation of antibiotics    ----------------------------     -----------------------------       PCT 0.25 - 0.50 ng/mL            PCT 0.10 - 0.25 ng/mL               OR       >80% decrease in PCT            Discourage initiation of                                            antibiotics      Encourage discontinuation           of antibiotics    ----------------------------     -----------------------------         PCT >= 0.50 ng/mL              PCT 0.26 - 0.50 ng/mL               AND                                 <80% decrease in PCT             Encourage initiation of                                             antibiotics       Encourage continuation           of antibiotics    ----------------------------     -----------------------------        PCT >=  0.50 ng/mL                  PCT > 0.50 ng/mL               AND         increase in PCT                  Strongly encourage                                      initiation of antibiotics    Strongly encourage escalation           of antibiotics                                     -----------------------------                                           PCT <= 0.25 ng/mL                                                 OR                                        > 80% decrease in PCT                                     Discontinue / Do not initiate                                             antibiotics Performed at [facility], [address], [place], [place] [address]   . Specimen Description 05/25/2019 BLOOD LEFT WRIST   Final  . Special Requests 05/25/2019 BOTTLES DRAWN AEROBIC AND ANAEROBIC Blood  Culture adequate volume   Final  . Culture 05/25/2019    Final                   Value:NO GROWTH 5 DAYS Performed at [facility], [address] [place], [place] [address]   . Report Status 05/25/2019 05/30/2019 FINAL   Final  . Specimen Description 05/25/2019 BLOOD LEFT ASSIST CONTROL   Final  . Special Requests 05/25/2019 BOTTLES DRAWN AEROBIC AND ANAEROBIC Blood Culture adequate volume   Final  . Culture 05/25/2019    Final                   Value:NO GROWTH 5 DAYS Performed at [facility], [address] [place], [place] [address]   . Report Status 05/25/2019 05/30/2019 FINAL   Final  . Color, Urine 05/25/2019 AMBER* YELLOW Final   BIOCHEMICALS MAY BE AFFECTED BY COLOR  . APPearance 05/25/2019 HAZY* CLEAR Final  . Specific Gravity, Urine 05/25/2019 1.030  1.005 - 1.030 Final  .  pH 05/25/2019 5.0  5.0 - 8.0 Final  . Glucose, UA 05/25/2019 NEGATIVE  NEGATIVE mg/dL Final  . Hgb urine dipstick 05/25/2019 NEGATIVE  NEGATIVE Final  . Bilirubin Urine 05/25/2019 NEGATIVE  NEGATIVE Final  . Ketones, ur 05/25/2019 NEGATIVE  NEGATIVE mg/dL Final  . Protein, ur 05/25/2019 30* NEGATIVE mg/dL Final  . Nitrite 05/25/2019 NEGATIVE  NEGATIVE Final  . Chalmers Guest 05/25/2019 NEGATIVE  NEGATIVE Final  . RBC / HPF 05/25/2019 >50* 0 - 5 RBC/hpf Final  . WBC, UA 05/25/2019 0-5  0 - 5 WBC/hpf Final  . Bacteria, UA 05/25/2019 NONE SEEN  NONE SEEN Final  . Squamous Epithelial / LPF 05/25/2019 0-5  0 - 5 Final  . Mucus 05/25/2019 PRESENT   Final  . Hyaline Casts, UA 05/25/2019 PRESENT   Final   Performed at [facility], [address], [place], [place] [address]  . Specimen Description 05/25/2019    Final                   Value:URINE, RANDOM Performed at [facility], [address] [place], [place] [address]   . Special Requests 05/25/2019    Final                   Value:Normal Performed at [facility], [address] [place], [place]  [address]   . Culture 05/25/2019    Final                   Value:NO GROWTH Performed at [facility], [address] [place],  [address]   . Report Status 05/25/2019 05/26/2019 FINAL   Final  . SARS Coronavirus 2 05/25/2019 NEGATIVE  NEGATIVE Final   Comment: (NOTE) If result is NEGATIVE SARS-CoV-2 target nucleic acids are NOT DETECTED. The SARS-CoV-2 RNA is generally detectable in upper and lower  respiratory specimens during the acute phase of infection. The lowest  concentration of SARS-CoV-2 viral copies this assay can detect is 250  copies / mL. A negative result does not preclude SARS-CoV-2 infection  and should not be used as the sole basis for treatment or other  patient management decisions.  A negative result may occur with  improper specimen collection / handling, submission of specimen other  than nasopharyngeal swab, presence of viral mutation(s) within the  areas targeted by this assay, and inadequate number of viral copies  (<250 copies / mL). A negative result must be combined with clinical  observations, patient history, and epidemiological information. If result is POSITIVE SARS-CoV-2 target nucleic acids are DETECTED. The SARS-CoV-2 RNA is generally detectable in upper and lower  respiratory specimens dur                          ing the acute phase of infection.  Positive  results are indicative of active infection with SARS-CoV-2.  Clinical  correlation with patient history and other diagnostic information is  necessary to determine patient infection status.  Positive results do  not rule out bacterial infection or co-infection with other viruses. If result is PRESUMPTIVE POSTIVE SARS-CoV-2 nucleic acids MAY BE PRESENT.   A presumptive positive result was obtained on the submitted specimen  and confirmed on repeat testing.  While 2019 novel coronavirus  (SARS-CoV-2) nucleic acids may be present in the submitted sample  additional confirmatory  testing may be necessary for epidemiological  and / or clinical management purposes  to differentiate  between  SARS-CoV-2 and other Sarbecovirus currently known to infect humans.  If clinically indicated additional testing with an alternate test  methodology [phone]) is advised. The SARS-CoV-2 RNA is generally  detectable in upper and lower respiratory sp                          ecimens during the acute  phase of infection. The expected result is Negative. Fact Sheet for Patients:  StrictlyIdeas.no Fact Sheet for Healthcare Providers: BankingDealers.co.za This test is not yet approved or cleared by the [place] FDA and has been authorized for detection and/or diagnosis of SARS-CoV-2 by FDA under an Emergency Use Authorization (EUA).  This EUA will remain in effect (meaning this test can be used) for the duration of the COVID-19 declaration under Section 564(b)(1) of the Act, 21 U.S.C. section 360bbb-3(b)(1), unless the authorization is terminated or revoked sooner. Performed at [facility], [address], [place], [place] [address]   . HIV Screen 4th Generation wRfx 05/25/2019 Non Reactive  Non Reactive Final   Comment: (NOTE) Performed At: [facility] [place], [place] [id number] Rush Farmer MD NF:[id number]   . WBC 05/25/2019 8.9  4.0 - 10.5 K/uL Final  . RBC 05/25/2019 3.36* 4.22 - 5.81 MIL/uL Final  . Hemoglobin 05/25/2019 8.9* 13.0 - 17.0 g/dL Final  . HCT 05/25/2019 28.3* 39.0 - 52.0 % Final  . MCV 05/25/2019 84.2  80.0 - 100.0 fL Final  . MCH 05/25/2019 26.5  26.0 - 34.0 pg Final  . MCHC 05/25/2019 31.4  30.0 - 36.0 g/dL Final  . RDW 05/25/2019 15.2  11.5 - 15.5 % Final  . Platelets 05/25/2019 508* 150 - 400 K/uL Final  . nRBC 05/25/2019 0.0  0.0 - 0.2 % Final   Performed at [facility], [address], [place], [place] [address]  . Creatinine, Ser 05/25/2019 0.64  0.61  - 1.24 mg/dL Final  . GFR calc non Af Amer 05/25/2019 >60  >60 mL/min Final  . GFR calc Af Amer 05/25/2019 >60  >60 mL/min Final   Performed at [facility], [address] [place], [place] [address]  . Adenovirus 05/25/2019 NOT DETECTED  NOT DETECTED Final  . Coronavirus 229E 05/25/2019 NOT DETECTED  NOT DETECTED Final   Comment: (NOTE) The Coronavirus on the Respiratory Panel, DOES NOT test for the novel  Coronavirus (2019 nCoV)   . Coronavirus HKU1 05/25/2019 NOT DETECTED  NOT DETECTED Final  . Coronavirus NL63 05/25/2019 NOT DETECTED  NOT DETECTED Final  . Coronavirus OC43 05/25/2019 NOT DETECTED  NOT DETECTED Final  . Metapneumovirus 05/25/2019 NOT DETECTED  NOT DETECTED Final  . Rhinovirus / Enterovirus 05/25/2019 NOT DETECTED  NOT DETECTED Final  . Influenza A 05/25/2019 NOT DETECTED  NOT DETECTED Final  . Influenza B 05/25/2019 NOT DETECTED  NOT DETECTED Final  . Parainfluenza Virus 1 05/25/2019 NOT DETECTED  NOT DETECTED Final  . Parainfluenza Virus 2 05/25/2019 NOT DETECTED  NOT DETECTED Final  . Parainfluenza Virus 3 05/25/2019 NOT DETECTED  NOT DETECTED Final  . Parainfluenza Virus 4 05/25/2019 NOT DETECTED  NOT DETECTED Final  . Respiratory Syncytial Virus 05/25/2019 NOT DETECTED  NOT DETECTED Final  . Bordetella pertussis 05/25/2019 NOT DETECTED  NOT DETECTED Final  . Chlamydophila pneumoniae 05/25/2019 NOT DETECTED  NOT DETECTED Final  . Mycoplasma pneumoniae 05/25/2019 NOT DETECTED  NOT DETECTED Final   Performed at [facility], [address] [place], [place] [address]  . Influenza A By PCR 05/25/2019 NEGATIVE  NEGATIVE Final  . Influenza B By PCR 05/25/2019 NEGATIVE  NEGATIVE Final   Comment: (NOTE) The Xpert Xpress Flu assay is intended as an aid in the diagnosis of  influenza and should not be used as a sole basis for treatment.  This  assay is FDA approved for nasopharyngeal swab specimens only. Nasal  washings and aspirates are unacceptable for  Xpert Xpress Flu testing. Performed at [facility], [address], [place], [place] [address]   . Strep Pneumo Urinary Antigen 05/25/2019 NEGATIVE  NEGATIVE Final   Comment:        Infection due to S. pneumoniae cannot be absolutely ruled out since the antigen present may be below the detection limit of the test. Performed at [facility], [address], [place], [place] [address]   . L. pneumophila Serogp 1 Ur Ag 05/25/2019 Negative  Negative Final   Comment: (NOTE) Presumptive negative for L. pneumophila serogroup 1 antigen in urine, suggesting no recent or current infection. Legionnaires' disease cannot be ruled out since other serogroups and species may also cause disease. Performed At: [facility] [place], [place] [id number] Rush Farmer MD VX:[id number]   . Source of Sample 05/25/2019 URINE, RANDOM   Final   Performed at [facility], [place] [address] [place], [place] [address]  . Adenovirus Antibody 05/25/2019 Negative  Neg:<1:8 Final   Comment: (NOTE) Performed At: [facility] [place], [place] [id number] Rush Farmer MD TG:[id number]   . Retic Ct Pct 05/25/2019 2.0  0.4 - 3.1 % Final  . RBC. 05/25/2019 3.37* 4.22 - 5.81 MIL/uL Final  . Retic Count, Absolute 05/25/2019 68.7  19.0 - 186.0 K/uL Final  . Immature Retic Fract 05/25/2019 9.0  2.3 - 15.9 % Final  . Reticulocyte Hemoglobin 05/25/2019 26.5* >27.9 pg Final   Comment:        A RET-He < 28 pg is an indication of iron-deficient or iron- insufficient erythropoiesis. Patients with thalassemia may also have a decreased RET-He result unrelated to iron availability.     If this patient has chronic kidney disease and does not have a hemoglobinopathy he/she meets criteria for iron deficiency per the 2016 NICE guidelines. Refer to specific guidelines to determine the appropriate thresholds for treating CKD- associated iron  deficiency. TSAT and ferritin should be used in patients with hemoglobinopathies (e.g. thalassemia). Performed at [facility], [address] [place], [place] [address]   . Sed Rate 05/26/2019 65* 0 - 20 mm/hr Final   Performed at [facility], [address] [place], [place] [address]  . CRP 05/26/2019 24.0* <1.0 mg/dL Final   Performed at [facility] [address] [place], [place] [address]  . Total CK 05/26/2019 18* 49 - 397 U/L Final   Performed at [facility], [address] [place], [place] [address]  . GGT 05/26/2019 78* 7 - 50 U/L Final   Performed at [facility] [address], [place], [place] [address]  . Total Protein ELP 05/26/2019 5.1* 6.0 - 8.5 g/dL Final  . IgG (Immunoglobin G), Serum 05/26/2019 1,223  603 - 1,613 mg/dL Final  . IgA 05/26/2019 347  61 - 437 mg/dL Final  . IgM (Immunoglobulin M), Srm 05/26/2019 71  15 - 143 mg/dL Final   Comment: (NOTE) Performed At: [facility] [address] [place], [place] [id number]  Rush Farmer MD EX:[id number]   . Immunofixation Result, Serum 05/26/2019 Comment   Corrected   Comment: (NOTE) The immunofixation pattern appears unremarkable. Evidence of monoclonal protein is not apparent.   . Immunofixation, Urine 05/26/2019 Comment   Final   Comment: (NOTE) The immunofixation pattern appears unremarkable. Evidence of monoclonal protein is not apparent. Performed At: [facility] [place], [place] [id number] Rush Farmer MD YQ:[id number]   . Creatinine, Urine 05/26/2019 99  mg/dL Final  . Total Protein, Urine 05/26/2019 43  mg/dL Final   NO NORMAL RANGE ESTABLISHED FOR THIS TEST  . Protein Creatinine Ratio 05/26/2019 0.43* 0.00 - 0.15 mg/mg[Cre] Final   Performed at [facility], [address] [place], [place] [address]  . Cortisol - AM 05/26/2019 14.4  6.7 - 22.6 ug/dL Final   Performed at [facility], [place]  [address] [place], [place] [address]  . TSH 05/26/2019 4.289  0.350 - 4.500 uIU/mL Final   Comment: Performed by a 3rd Generation assay with a functional sensitivity of <=0.01 uIU/mL. Performed at [facility], [address] [place], [place] [address]   . Aldolase 05/26/2019 3.5  3.3 - 10.3 U/L Final   Comment: (NOTE) Performed At: [facility] [place], [place] [id number] Rush Farmer MD WF:[id number]   . Procalcitonin 05/26/2019 0.19  ng/mL Final   Comment:        Interpretation: PCT (Procalcitonin) <= 0.5 ng/mL: Systemic infection (sepsis) is not likely. Local bacterial infection is possible. (NOTE)       Sepsis PCT Algorithm           Lower Respiratory Tract                                      Infection PCT Algorithm    ----------------------------     ----------------------------         PCT < 0.25 ng/mL                PCT < 0.10 ng/mL         Strongly encourage             Strongly discourage   discontinuation of antibiotics    initiation of antibiotics    ----------------------------     -----------------------------       PCT 0.25 - 0.50 ng/mL            PCT 0.10 - 0.25 ng/mL               OR       >80% decrease in PCT            Discourage initiation of                                            antibiotics      Encourage discontinuation           of antibiotics    ----------------------------     -----------------------------         PCT >= 0.50 ng/mL              PCT 0.26 - 0.50 ng/mL               AND                                 <  80% decrease in PCT             Encourage initiation of                                             antibiotics       Encourage continuation           of antibiotics    ----------------------------     -----------------------------        PCT >= 0.50 ng/mL                  PCT > 0.50 ng/mL               AND         increase in PCT                  Strongly encourage                                       initiation of antibiotics    Strongly encourage escalation           of antibiotics                                     -----------------------------                                           PCT <= 0.25 ng/mL                                                 OR                                        > 80% decrease in PCT                                     Discontinue / Do not initiate                                             antibiotics Performed at [facility], [address] [place], [place] [address]   . WBC 05/27/2019 9.5  4.0 - 10.5 K/uL Final  . RBC 05/27/2019 2.99* 4.22 - 5.81 MIL/uL Final  . Hemoglobin 05/27/2019 8.0* 13.0 - 17.0 g/dL Final  . HCT 05/27/2019 25.0* 39.0 - 52.0 % Final  . MCV 05/27/2019 83.6  80.0 - 100.0 fL Final  . MCH 05/27/2019 26.8  26.0 - 34.0 pg Final  . MCHC 05/27/2019 32.0  30.0 - 36.0 g/dL Final  . RDW 05/27/2019 15.1  11.5 - 15.5 % Final  . Platelets 05/27/2019 424* 150 - 400 K/uL Final  .  nRBC 05/27/2019 0.0  0.0 - 0.2 % Final   Performed at [facility], [address] [place], [place] [address]  . Sodium 05/27/2019 130* 135 - 145 mmol/L Final  . Potassium 05/27/2019 3.5  3.5 - 5.1 mmol/L Final  . Chloride 05/27/2019 97* 98 - 111 mmol/L Final  . CO2 05/27/2019 27  22 - 32 mmol/L Final  . Glucose, Bld 05/27/2019 142* 70 - 99 mg/dL Final  . BUN 05/27/2019 6* 8 - 23 mg/dL Final  . Creatinine, Ser 05/27/2019 0.56* 0.61 - 1.24 mg/dL Final  . Calcium 05/27/2019 7.5* 8.9 - 10.3 mg/dL Final  . GFR calc non Af Amer 05/27/2019 >60  >60 mL/min Final  . GFR calc Af Amer 05/27/2019 >60  >60 mL/min Final  . Anion gap 05/27/2019 6  5 - 15 Final   Performed at [facility], [address] [place], [place] [address]  Hospital Outpatient Visit on 05/23/2019  Component Date Value Ref Range Status  . Fibrin derivatives D-dimer ([facility]) 05/23/2019 4,773.20* 0.00 - 499.00 ng/mL (FEU) Final   Comment: (NOTE) <> Exclusion of Venous  Thromboembolism (VTE) - OUTPATIENT ONLY   (Emergency Department or [place])   0-499 ng/ml (FEU): With a low to intermediate pretest probability                      for VTE this test result excludes the diagnosis                      of VTE.   >499 ng/ml (FEU) : VTE not excluded; additional work up for VTE is                      required. <> Testing on Inpatients and Evaluation of Disseminated Intravascular   Coagulation (DIC) Reference Range:   0-499 ng/ml (FEU) Performed at [facility], [address] [place], [place] [address]   Admission on 05/16/2019, Discharged on 05/16/2019  Component Date Value Ref Range Status  . Sodium 05/16/2019 132* 135 - 145 mmol/L Final  . Potassium 05/16/2019 4.3  3.5 - 5.1 mmol/L Final  . Chloride 05/16/2019 96* 98 - 111 mmol/L Final  . CO2 05/16/2019 25  22 - 32 mmol/L Final  . Glucose, Bld 05/16/2019 143* 70 - 99 mg/dL Final  . BUN 05/16/2019 10  8 - 23 mg/dL Final  . Creatinine, Ser 05/16/2019 0.63  0.61 - 1.24 mg/dL Final  . Calcium 05/16/2019 8.1* 8.9 - 10.3 mg/dL Final  . GFR calc non Af Amer 05/16/2019 >60  >60 mL/min Final  . GFR calc Af Amer 05/16/2019 >60  >60 mL/min Final  . Anion gap 05/16/2019 11  5 - 15 Final   Performed at [facility], [address] [place], [place] [address]  . WBC 05/16/2019 11.8* 4.0 - 10.5 K/uL Final  . RBC 05/16/2019 3.17* 4.22 - 5.81 MIL/uL Final  . Hemoglobin 05/16/2019 8.6* 13.0 - 17.0 g/dL Final  . HCT 05/16/2019 26.9* 39.0 - 52.0 % Final  . MCV 05/16/2019 84.9  80.0 - 100.0 fL Final  . MCH 05/16/2019 27.1  26.0 - 34.0 pg Final  . MCHC 05/16/2019 32.0  30.0 - 36.0 g/dL Final  . RDW 05/16/2019 14.9  11.5 - 15.5 % Final  . Platelets 05/16/2019 628* 150 - 400 K/uL Final  . nRBC 05/16/2019 0.0  0.0 - 0.2 % Final   Performed at [facility],  [place], [place] [address]  Hospital Outpatient Visit on 05/16/2019  Component Date Value Ref Range Status  . Fibrin  derivatives D-dimer ([facility]) 05/16/2019 5,522.47* 0.00 - 499.00 ng/mL (FEU) Final   Comment: (NOTE) <> Exclusion of Venous Thromboembolism (VTE) - OUTPATIENT ONLY   (Emergency Department or [place])   0-499 ng/ml (FEU): With a low to intermediate pretest probability                      for VTE this test result excludes the diagnosis                      of VTE.   >499 ng/ml (FEU) : VTE not excluded; additional work up for VTE is                      required. <> Testing on Inpatients and Evaluation of Disseminated Intravascular   Coagulation (DIC) Reference Range:   0-499 ng/ml (FEU) Performed at [facility], [address] [place], [place] [address]     Positive neutrophil antibodies.  07/29/2018 Flowcytometry showed no significant diagnostic immunophenotypic abnormality. 10% large granular lymphocytes.  RADIOGRAPHIC STUDIES: I have personally reviewed the radiological images as listed and agreed with the findings in the report. Ct Chest W Contrast  Result Date: 05/25/2019 CLINICAL DATA:  Chronic dyspnea.  Fever. EXAM: CT CHEST, ABDOMEN, AND PELVIS WITH CONTRAST TECHNIQUE: Multidetector CT imaging of the chest, abdomen and pelvis was performed following the standard protocol during bolus administration of intravenous contrast. CONTRAST:  156m OMNIPAQUE IOHEXOL 300 MG/ML  SOLN COMPARISON:  Chest CT from 9 days ago. Abdomen and pelvis CT 06/25/2010 FINDINGS: CT CHEST FINDINGS Cardiovascular: Normal heart size. Trace pericardial fluid. No acute vascular finding. Mediastinum/Nodes: Stable prominent bilateral hilar lymph nodes which are symmetric and noncalcified. Lungs/Pleura: Unchanged airway thickening with asymmetric right-sided pulmonary reticulation with a mild subpleural predilection. This could reflect atypical infection, sequela of aspiration, or inflammatory pneumonia. No acute airspace disease, edema, effusion, honeycombing, or pneumothorax. Musculoskeletal: Degenerative  disease without acute finding CT ABDOMEN PELVIS FINDINGS Hepatobiliary: No focal liver abnormality.No evidence of biliary obstruction or stone. Pancreas: Unremarkable. Spleen: Unremarkable. Adrenals/Urinary Tract: 11 mm left adrenal adenoma that remain size stable. 6 mm left lower pole renal calculus. No hydronephrosis or ureteral stone. Limited assessment of the bladder due to streak artifact. Tiny right renal cystic density. Stomach/Bowel:  No obstruction. No evidence of bowel inflammation Vascular/Lymphatic: Mild atherosclerosis. Prominent peripancreatic arterial arcade with tortuous hepatic artery, stable. No underlying obstructive process is seen. No mass or adenopathy. Reproductive:Pelvic structures are largely obscured by hip prostheses. Other: No ascites or pneumoperitoneum. Musculoskeletal: Advanced lower lumbar facet and disc degeneration with multilevel listhesis and scoliosis. IMPRESSION: Chest CT: 1. No acute finding or change from 05/16/2019. 2. Asymmetric reticulation in the right lung as described previously, please see follow-up recommendations at that time. Abdominal CT: 1. No emergent finding. 2. Left nephrolithiasis and small left adrenal adenoma. Electronically Signed   By: JMonte FantasiaM.D.   On: 05/25/2019 05:22   Ct Angio Chest Pe W And/or Wo Contrast  Result Date: 05/16/2019 CLINICAL DATA:  PE suspected, positive D-dimer EXAM: CT ANGIOGRAPHY CHEST WITH CONTRAST TECHNIQUE: Multidetector CT imaging of the chest was performed using the standard protocol during bolus administration of intravenous contrast. Multiplanar CT image reconstructions and MIPs were obtained to evaluate the vascular anatomy. CONTRAST:  780mOMNIPAQUE IOHEXOL 350 MG/ML SOLN COMPARISON:  None. FINDINGS: Cardiovascular: Examination is mildly limited by motion artifact and marginal contrast bolus for the evaluation of pulmonary embolism, main pulmonary artery = 195 HU. Within this limitation, no evidence of pulmonary  embolism through the segmental pulmonary arterial level. Normal heart size. No pericardial effusion. Mediastinum/Nodes: Prominent bilateral axillary lymph nodes. Prominent although not pathologically enlarged mediastinal and hilar lymph nodes thyroid gland, trachea, and esophagus demonstrate no significant findings. Lungs/Pleura: There is diffuse bilateral bronchial wall thickening, most conspicuous in the right lower lobe, with extensive, predominantly right-sided subpleural ground-glass and irregular pulmonary opacity nodularity. Scarring and/or atelectasis of the dependent bilateral lung bases. No pleural effusion or pneumothorax. Upper Abdomen: No acute abnormality. Musculoskeletal: No chest wall abnormality. No acute or significant osseous findings. Review of the MIP images confirms the above findings. IMPRESSION: 1. Examination is mildly limited by motion artifact and marginal contrast bolus for the evaluation of pulmonary embolism, main pulmonary artery = 195 HU. Within this limitation, no evidence of pulmonary embolism through the segmental pulmonary arterial level. 2. There is diffuse bilateral bronchial wall thickening, most conspicuous in the right lower lobe, with extensive, predominantly right-sided subpleural ground-glass and irregular pulmonary opacity nodularity. Scarring and/or atelectasis of the dependent bilateral lung bases. Generally suspect atypical infection and/or chronic sequelae, such as aspiration, however mild pulmonary fibrosis is not excluded. Consider follow-up ILD protocol CT in 6-12 months if clinically appropriate. Electronically Signed   By: Eddie Candle M.D.   On: 05/16/2019 18:45   Ct Abdomen Pelvis W Contrast  Result Date: 05/25/2019 CLINICAL DATA:  Chronic dyspnea.  Fever. EXAM: CT CHEST, ABDOMEN, AND PELVIS WITH CONTRAST TECHNIQUE: Multidetector CT imaging of the chest, abdomen and pelvis was performed following the standard protocol during bolus administration of  intravenous contrast. CONTRAST:  134m OMNIPAQUE IOHEXOL 300 MG/ML  SOLN COMPARISON:  Chest CT from 9 days ago. Abdomen and pelvis CT 06/25/2010 FINDINGS: CT CHEST FINDINGS Cardiovascular: Normal heart size. Trace pericardial fluid. No acute vascular finding. Mediastinum/Nodes: Stable prominent bilateral hilar lymph nodes which are symmetric and noncalcified. Lungs/Pleura: Unchanged airway thickening with asymmetric right-sided pulmonary reticulation with a mild subpleural predilection. This could reflect atypical infection, sequela of aspiration, or inflammatory pneumonia. No acute airspace disease, edema, effusion, honeycombing, or pneumothorax. Musculoskeletal: Degenerative disease without acute finding CT ABDOMEN PELVIS FINDINGS Hepatobiliary: No focal liver abnormality.No evidence of biliary obstruction or stone. Pancreas: Unremarkable. Spleen: Unremarkable. Adrenals/Urinary Tract: 11 mm left adrenal adenoma that remain size stable. 6 mm left lower pole renal calculus. No hydronephrosis or ureteral stone. Limited assessment of the bladder due to streak artifact. Tiny right renal cystic density. Stomach/Bowel:  No obstruction. No evidence of bowel inflammation Vascular/Lymphatic: Mild atherosclerosis. Prominent peripancreatic arterial arcade with tortuous hepatic artery, stable. No underlying obstructive process is seen. No mass or adenopathy. Reproductive:Pelvic structures are largely obscured by hip prostheses. Other: No ascites or pneumoperitoneum. Musculoskeletal: Advanced lower lumbar facet and disc degeneration with multilevel listhesis and scoliosis. IMPRESSION: Chest CT: 1. No acute finding or change from 05/16/2019. 2. Asymmetric reticulation in the right lung as described previously, please see follow-up recommendations at that time. Abdominal CT: 1. No emergent finding. 2. Left nephrolithiasis and small left adrenal adenoma. Electronically Signed   By: JMonte FantasiaM.D.   On: 05/25/2019 05:22    Dg Chest Portable 1 View  Result Date: 05/25/2019 CLINICAL DATA:  Fever cough and weakness EXAM: PORTABLE CHEST 1 VIEW COMPARISON:  07/12/2013 FINDINGS: The heart size and mediastinal contours are within normal limits. Both lungs  are clear. The visualized skeletal structures are unremarkable. IMPRESSION: No active disease. Electronically Signed   By: Donavan Foil M.D.   On: 05/25/2019 03:05     ASSESSMENT/PLAN 1. Iron deficiency anemia, unspecified iron deficiency anemia type   2. Weight loss    Repeat labs were reviewed and discussed with patient, and also wife over the phone.   Patient has a white count of 12.2, predominantly neutrophilia Hemoglobin 8.5, MCV 81.6. Reticulocyte hemoglobin remains decreased at 26.7. Iron panel was repeated.  Saturation 12, ferritin level 491. Elevated ferritin likely secondary to chronic inflammation due to autoimmune disease. Recommend patient to proceed with IV Venofer. Plan IV iron with Venofer 228m weekly 2 doses. Allergy reactions/infusion reaction including anaphylactic reaction discussed with patient. Other side effects include but not limited to high blood pressure, skin rash, weight gain, leg swelling, etc. Patient voices understanding and willing to proceed.  Thrombocytosis can be reactive, secondary to chronic inflammation, or iron deficiency.  Need to rule out underlying bone marrow disease.   #Leukocytosis, I will repeat flow cytometry, check LDH, uric acid, #Rheumatoid arthritis, weight loss,  persistent leukocytosis,, will need further work-up for possible LGL, Felty syndrome- no hepatosplenomegaly. ,   # History of PSA, need additional work up.  # elevated GGT,    Need additional work up  Check flowcytometry, CMP, Jak 2 mutation with reflex, uric acid, LDH, hepatitis panel, LDH, smear, BCR ABL, soluble transferrin receptor.  Consider bone marrow biopsy.  Plan was discussed with patient and Mrs. HAline Brochure  They agrees with the  plan. Return visit: to be determined.  Orders Placed This Encounter  Procedures  . Flow cytometry panel-leukemia/lymphoma work-up    Standing Status:   Future    Standing Expiration Date:   06/10/2020  . Hepatitis panel, acute    Standing Status:   Future    Standing Expiration Date:   06/10/2020  . Lactate dehydrogenase    Standing Status:   Future    Standing Expiration Date:   06/10/2020  . Uric acid    Standing Status:   Future    Standing Expiration Date:   06/10/2020  . JAK2 V617F, w Reflex to CALR/E12/MPL    Standing Status:   Future    Standing Expiration Date:   06/10/2020  . PSA    Standing Status:   Future    Standing Expiration Date:   06/10/2020  . BCR-ABL1 FISH    Standing Status:   Future    Standing Expiration Date:   06/10/2020  . Technologist smear review    Standing Status:   Future    Standing Expiration Date:   06/10/2020  . Miscellaneous [facility] test (send-out)    Standing Status:   Future    Standing Expiration Date:   06/10/2020    Order Specific Question:   Test name / description:    Answer:   Soluble Transferrin Receptor, TEST: [id number]   We spent sufficient time to discuss many aspect of care, questions were answered to patient's satisfaction. Total face to face encounter time for this patient visit was 40 min. >50% of the time was  spent in counseling and coordination of care.   ZEarlie Server MD  06/11/2019 11:10 AM

## 2019-06-13 ENCOUNTER — Other Ambulatory Visit: Payer: Self-pay

## 2019-06-13 ENCOUNTER — Inpatient Hospital Stay: Payer: Medicare HMO | Attending: Oncology

## 2019-06-13 ENCOUNTER — Other Ambulatory Visit: Payer: Self-pay | Admitting: *Deleted

## 2019-06-13 DIAGNOSIS — Z7951 Long term (current) use of inhaled steroids: Secondary | ICD-10-CM | POA: Insufficient documentation

## 2019-06-13 DIAGNOSIS — D696 Thrombocytopenia, unspecified: Secondary | ICD-10-CM

## 2019-06-13 DIAGNOSIS — Z8042 Family history of malignant neoplasm of prostate: Secondary | ICD-10-CM | POA: Diagnosis not present

## 2019-06-13 DIAGNOSIS — N400 Enlarged prostate without lower urinary tract symptoms: Secondary | ICD-10-CM | POA: Insufficient documentation

## 2019-06-13 DIAGNOSIS — R634 Abnormal weight loss: Secondary | ICD-10-CM

## 2019-06-13 DIAGNOSIS — Z79899 Other long term (current) drug therapy: Secondary | ICD-10-CM | POA: Insufficient documentation

## 2019-06-13 DIAGNOSIS — D709 Neutropenia, unspecified: Secondary | ICD-10-CM | POA: Diagnosis not present

## 2019-06-13 DIAGNOSIS — R7989 Other specified abnormal findings of blood chemistry: Secondary | ICD-10-CM | POA: Insufficient documentation

## 2019-06-13 DIAGNOSIS — Z87891 Personal history of nicotine dependence: Secondary | ICD-10-CM | POA: Insufficient documentation

## 2019-06-13 DIAGNOSIS — M0510 Rheumatoid lung disease with rheumatoid arthritis of unspecified site: Secondary | ICD-10-CM | POA: Diagnosis not present

## 2019-06-13 DIAGNOSIS — R1319 Other dysphagia: Secondary | ICD-10-CM | POA: Insufficient documentation

## 2019-06-13 DIAGNOSIS — D075 Carcinoma in situ of prostate: Secondary | ICD-10-CM | POA: Diagnosis not present

## 2019-06-13 DIAGNOSIS — D509 Iron deficiency anemia, unspecified: Secondary | ICD-10-CM | POA: Diagnosis not present

## 2019-06-13 DIAGNOSIS — M059 Rheumatoid arthritis with rheumatoid factor, unspecified: Secondary | ICD-10-CM | POA: Diagnosis not present

## 2019-06-13 DIAGNOSIS — R531 Weakness: Secondary | ICD-10-CM | POA: Diagnosis not present

## 2019-06-13 DIAGNOSIS — D3502 Benign neoplasm of left adrenal gland: Secondary | ICD-10-CM | POA: Diagnosis not present

## 2019-06-13 DIAGNOSIS — R5383 Other fatigue: Secondary | ICD-10-CM | POA: Diagnosis not present

## 2019-06-13 DIAGNOSIS — R748 Abnormal levels of other serum enzymes: Secondary | ICD-10-CM

## 2019-06-13 DIAGNOSIS — D649 Anemia, unspecified: Secondary | ICD-10-CM | POA: Insufficient documentation

## 2019-06-13 DIAGNOSIS — M6281 Muscle weakness (generalized): Secondary | ICD-10-CM | POA: Diagnosis not present

## 2019-06-13 DIAGNOSIS — J208 Acute bronchitis due to other specified organisms: Secondary | ICD-10-CM | POA: Diagnosis not present

## 2019-06-13 DIAGNOSIS — I10 Essential (primary) hypertension: Secondary | ICD-10-CM | POA: Diagnosis not present

## 2019-06-13 LAB — CBC WITH DIFFERENTIAL/PLATELET
Abs Immature Granulocytes: 0.16 10*3/uL — ABNORMAL HIGH (ref 0.00–0.07)
Basophils Absolute: 0 10*3/uL (ref 0.0–0.1)
Basophils Relative: 0 %
Eosinophils Absolute: 0 10*3/uL (ref 0.0–0.5)
Eosinophils Relative: 0 %
HCT: 28.6 % — ABNORMAL LOW (ref 39.0–52.0)
Hemoglobin: 8.9 g/dL — ABNORMAL LOW (ref 13.0–17.0)
Immature Granulocytes: 1 %
Lymphocytes Relative: 13 %
Lymphs Abs: 1.6 10*3/uL (ref 0.7–4.0)
MCH: 25.1 pg — ABNORMAL LOW (ref 26.0–34.0)
MCHC: 31.1 g/dL (ref 30.0–36.0)
MCV: 80.8 fL (ref 80.0–100.0)
Monocytes Absolute: 0.9 10*3/uL (ref 0.1–1.0)
Monocytes Relative: 7 %
Neutro Abs: 10.3 10*3/uL — ABNORMAL HIGH (ref 1.7–7.7)
Neutrophils Relative %: 79 %
Platelets: 702 10*3/uL — ABNORMAL HIGH (ref 150–400)
RBC: 3.54 MIL/uL — ABNORMAL LOW (ref 4.22–5.81)
RDW: 16.1 % — ABNORMAL HIGH (ref 11.5–15.5)
WBC: 13 10*3/uL — ABNORMAL HIGH (ref 4.0–10.5)
nRBC: 0 % (ref 0.0–0.2)

## 2019-06-13 LAB — LACTATE DEHYDROGENASE: LDH: 78 U/L — ABNORMAL LOW (ref 98–192)

## 2019-06-13 LAB — PSA: Prostatic Specific Antigen: 1.63 ng/mL (ref 0.00–4.00)

## 2019-06-13 LAB — URIC ACID: Uric Acid, Serum: 3.5 mg/dL — ABNORMAL LOW (ref 3.7–8.6)

## 2019-06-13 LAB — TECHNOLOGIST SMEAR REVIEW: Plt Morphology: INCREASED

## 2019-06-14 LAB — HEPATITIS PANEL, ACUTE
HCV Ab: 0.3 s/co ratio (ref 0.0–0.9)
Hep A IgM: NEGATIVE
Hep B C IgM: NEGATIVE
Hepatitis B Surface Ag: NEGATIVE

## 2019-06-14 LAB — MISC LABCORP TEST (SEND OUT): Labcorp test code: 143305

## 2019-06-15 ENCOUNTER — Other Ambulatory Visit: Payer: Self-pay | Admitting: Radiology

## 2019-06-15 DIAGNOSIS — I10 Essential (primary) hypertension: Secondary | ICD-10-CM | POA: Diagnosis not present

## 2019-06-15 DIAGNOSIS — M6281 Muscle weakness (generalized): Secondary | ICD-10-CM | POA: Diagnosis not present

## 2019-06-15 DIAGNOSIS — D075 Carcinoma in situ of prostate: Secondary | ICD-10-CM | POA: Diagnosis not present

## 2019-06-15 DIAGNOSIS — D709 Neutropenia, unspecified: Secondary | ICD-10-CM | POA: Diagnosis not present

## 2019-06-15 DIAGNOSIS — D3502 Benign neoplasm of left adrenal gland: Secondary | ICD-10-CM | POA: Diagnosis not present

## 2019-06-15 DIAGNOSIS — M0510 Rheumatoid lung disease with rheumatoid arthritis of unspecified site: Secondary | ICD-10-CM | POA: Diagnosis not present

## 2019-06-15 DIAGNOSIS — D509 Iron deficiency anemia, unspecified: Secondary | ICD-10-CM | POA: Diagnosis not present

## 2019-06-15 DIAGNOSIS — N400 Enlarged prostate without lower urinary tract symptoms: Secondary | ICD-10-CM | POA: Diagnosis not present

## 2019-06-15 DIAGNOSIS — J208 Acute bronchitis due to other specified organisms: Secondary | ICD-10-CM | POA: Diagnosis not present

## 2019-06-16 ENCOUNTER — Ambulatory Visit
Admission: RE | Admit: 2019-06-16 | Discharge: 2019-06-16 | Disposition: A | Discharge status: Home / Self Care | Payer: Medicare HMO | Source: Ambulatory Visit | Attending: Oncology | Admitting: Oncology

## 2019-06-16 ENCOUNTER — Other Ambulatory Visit: Payer: Self-pay

## 2019-06-16 ENCOUNTER — Other Ambulatory Visit (HOSPITAL_COMMUNITY)
Admission: RE | Admit: 2019-06-16 | Discharge: 2019-06-16 | Disposition: A | Discharge status: Home / Self Care | Payer: Medicare HMO | Source: Ambulatory Visit | Attending: Oncology | Admitting: Oncology

## 2019-06-16 DIAGNOSIS — D72829 Elevated white blood cell count, unspecified: Secondary | ICD-10-CM | POA: Insufficient documentation

## 2019-06-16 DIAGNOSIS — D696 Thrombocytopenia, unspecified: Secondary | ICD-10-CM | POA: Insufficient documentation

## 2019-06-16 DIAGNOSIS — Z87891 Personal history of nicotine dependence: Secondary | ICD-10-CM | POA: Insufficient documentation

## 2019-06-16 DIAGNOSIS — R634 Abnormal weight loss: Secondary | ICD-10-CM | POA: Insufficient documentation

## 2019-06-16 DIAGNOSIS — Z8042 Family history of malignant neoplasm of prostate: Secondary | ICD-10-CM | POA: Diagnosis not present

## 2019-06-16 DIAGNOSIS — D72822 Plasmacytosis: Secondary | ICD-10-CM | POA: Insufficient documentation

## 2019-06-16 DIAGNOSIS — D509 Iron deficiency anemia, unspecified: Secondary | ICD-10-CM | POA: Diagnosis not present

## 2019-06-16 DIAGNOSIS — M1990 Unspecified osteoarthritis, unspecified site: Secondary | ICD-10-CM | POA: Diagnosis not present

## 2019-06-16 DIAGNOSIS — D3502 Benign neoplasm of left adrenal gland: Secondary | ICD-10-CM | POA: Diagnosis not present

## 2019-06-16 DIAGNOSIS — D708 Other neutropenia: Secondary | ICD-10-CM | POA: Diagnosis not present

## 2019-06-16 DIAGNOSIS — I10 Essential (primary) hypertension: Secondary | ICD-10-CM | POA: Insufficient documentation

## 2019-06-16 DIAGNOSIS — Z7951 Long term (current) use of inhaled steroids: Secondary | ICD-10-CM | POA: Diagnosis not present

## 2019-06-16 DIAGNOSIS — J208 Acute bronchitis due to other specified organisms: Secondary | ICD-10-CM | POA: Diagnosis not present

## 2019-06-16 DIAGNOSIS — M0510 Rheumatoid lung disease with rheumatoid arthritis of unspecified site: Secondary | ICD-10-CM | POA: Diagnosis not present

## 2019-06-16 DIAGNOSIS — D649 Anemia, unspecified: Secondary | ICD-10-CM | POA: Diagnosis not present

## 2019-06-16 DIAGNOSIS — D075 Carcinoma in situ of prostate: Secondary | ICD-10-CM | POA: Diagnosis not present

## 2019-06-16 DIAGNOSIS — D473 Essential (hemorrhagic) thrombocythemia: Secondary | ICD-10-CM | POA: Diagnosis not present

## 2019-06-16 DIAGNOSIS — M6281 Muscle weakness (generalized): Secondary | ICD-10-CM | POA: Diagnosis not present

## 2019-06-16 DIAGNOSIS — N400 Enlarged prostate without lower urinary tract symptoms: Secondary | ICD-10-CM | POA: Diagnosis not present

## 2019-06-16 DIAGNOSIS — Z79899 Other long term (current) drug therapy: Secondary | ICD-10-CM | POA: Insufficient documentation

## 2019-06-16 DIAGNOSIS — Z96643 Presence of artificial hip joint, bilateral: Secondary | ICD-10-CM | POA: Insufficient documentation

## 2019-06-16 DIAGNOSIS — D709 Neutropenia, unspecified: Secondary | ICD-10-CM | POA: Diagnosis not present

## 2019-06-16 LAB — COMP PANEL: LEUKEMIA/LYMPHOMA

## 2019-06-16 LAB — PROTIME-INR
INR: 1.4 — ABNORMAL HIGH (ref 0.8–1.2)
Prothrombin Time: 17.3 seconds — ABNORMAL HIGH (ref 11.4–15.2)

## 2019-06-16 LAB — CBC
HCT: 29.9 % — ABNORMAL LOW (ref 39.0–52.0)
Hemoglobin: 9.2 g/dL — ABNORMAL LOW (ref 13.0–17.0)
MCH: 25.1 pg — ABNORMAL LOW (ref 26.0–34.0)
MCHC: 30.8 g/dL (ref 30.0–36.0)
MCV: 81.7 fL (ref 80.0–100.0)
Platelets: 686 10*3/uL — ABNORMAL HIGH (ref 150–400)
RBC: 3.66 MIL/uL — ABNORMAL LOW (ref 4.22–5.81)
RDW: 16.5 % — ABNORMAL HIGH (ref 11.5–15.5)
WBC: 14.6 10*3/uL — ABNORMAL HIGH (ref 4.0–10.5)
nRBC: 0 % (ref 0.0–0.2)

## 2019-06-16 MED ORDER — FENTANYL CITRATE (PF) 100 MCG/2ML IJ SOLN
INTRAMUSCULAR | Status: AC | PRN
  Administered 2019-06-16: 50 ug via INTRAVENOUS

## 2019-06-16 MED ORDER — FENTANYL CITRATE (PF) 100 MCG/2ML IJ SOLN
INTRAMUSCULAR | Status: AC
  Filled 2019-06-16: qty 2

## 2019-06-16 MED ORDER — MIDAZOLAM HCL 2 MG/2ML IJ SOLN
INTRAMUSCULAR | Status: AC
  Filled 2019-06-16: qty 2

## 2019-06-16 MED ORDER — MIDAZOLAM HCL 2 MG/2ML IJ SOLN
INTRAMUSCULAR | Status: AC | PRN
  Administered 2019-06-16: 1 mg via INTRAVENOUS

## 2019-06-16 MED ORDER — HEPARIN SOD (PORK) LOCK FLUSH 100 UNIT/ML IV SOLN
INTRAVENOUS | Status: AC
  Filled 2019-06-16: qty 5

## 2019-06-16 MED ORDER — SODIUM CHLORIDE 0.9 % IV SOLN
INTRAVENOUS | Status: DC
  Administered 2019-06-16: 08:00:00 via INTRAVENOUS

## 2019-06-16 NOTE — Procedures (Signed)
Interventional Radiology Procedure:   Indications: Anemia  Procedure: CT guided bone marrow biopsy  Findings: 2 aspirates and 1 core from right ilium  Complications: None     EBL: Minimal, less than 10 ml  Plan: Discharge to home in one hour.   Labradford Schnitker R. Myanna Ziesmer, MD  Pager: [phone]    

## 2019-06-16 NOTE — Progress Notes (Signed)
Remains clinically stable post procedure. Vitals stable. Discharge instructions given with wife present, questions answered.

## 2019-06-16 NOTE — Discharge Instructions (Signed)

## 2019-06-16 NOTE — Progress Notes (Signed)
Patient clinically stable post BMB per Dr Anselm Pancoast, tolerated well. Vitals stable. Denies complaints. Wife at bedside post procedure. DR Anselm Pancoast out to speak with wife with questions answered. bandade dry and intact.

## 2019-06-16 NOTE — Consult Note (Signed)
Chief Complaint: Patient was seen in consultation today for CT-guided bone marrow biopsy at the request of Yu,Zhou  Referring Physician(s): Yu,Zhou  Patient Status: [facility] - Out-pt  History of Present Illness: [name] Foster is a 78 y.o. 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  Request for bone marrow biopsy.  Patient's main complaint is bilateral thigh pain, particularly when he is standing and walking.  He denies fevers, chills, chest pain or respiratory issues.  Patient is accompanied by his wife.  Past Medical History:  Diagnosis Date   Arthritis    BPH (benign prostatic hyperplasia)    Former smoker, stopped smoking many years ago    History of needle biopsy of prostate with negative result 2013   Hypertension    Prostate enlargement    Prostatic intraepithelial neoplasm III 2013    Past Surgical History:  Procedure Laterality Date   HEMORROIDECTOMY     HERNIA REPAIR     JOINT REPLACEMENT Left    THR   PROSTATE BIOPSY  2013   TOTAL HIP ARTHROPLASTY Left    TOTAL HIP ARTHROPLASTY Right 04/26/2019   Procedure: TOTAL HIP ARTHROPLASTY ANTERIOR APPROACH;  Surgeon: Hessie Knows, MD;  Location: [facility] ORS;  Service: Orthopedics;  Laterality: Right;    Allergies: Methotrexate  Medications: Prior to Admission medications   Medication Sig Start Date End Date Taking? Authorizing Provider  albuterol (VENTOLIN HFA) 108 (90 Base) MCG/ACT inhaler Inhale 2 puffs into the lungs every 6 (six) hours as needed. 05/23/19  Yes [provider]  amoxicillin-clavulanate (AUGMENTIN) 875-125 MG tablet Take 1 tablet by mouth 2 (two) times a day. 06/03/19  Yes [provider]  azithromycin (ZITHROMAX) 250 MG tablet Take 1 tablet by mouth 1 day or 1 dose. 05/16/19  Yes [provider]  doxazosin (CARDURA) 4 MG tablet Take 1 tablet (4 mg total) by mouth daily. 08/09/18  Yes Stoioff, Ronda Fairly, MD  Ensure Max Protein (ENSURE MAX PROTEIN) LIQD Take 1  Bottle by mouth 2 (two) times a day. 06/03/19  Yes [provider]  ferrous [id number] C-folic acid (TRINSICON / FOLTRIN) capsule Take 1 capsule by mouth 2 (two) times daily. 04/28/19  Yes Duanne Guess, PA-C  finasteride (PROSCAR) 5 MG tablet Take 1 tablet (5 mg total) by mouth daily. 08/09/18  Yes Stoioff, Ronda Fairly, MD  Fluticasone-Salmeterol (ADVAIR) 250-50 MCG/DOSE AEPB Inhale 1 puff into the lungs 2 (two) times a day. 06/02/19 06/01/20 Yes [provider]  ORAL ELECTROLYTES PO Take 8 oz by mouth 1 day or 1 dose. 06/03/19  Yes [provider]     Family History  Problem Relation Age of Onset   Prostate cancer Father    Cancer Father    Prostate cancer Brother    Cancer Mother     Social History   Socioeconomic History   Marital status: Married    Spouse name: Not on file   Number of children: Not on file   Years of education: Not on file   Highest education level: Not on file  Occupational History   Not on file  Social Needs   Financial resource strain: Not on file   Food insecurity    Worry: Not on file    Inability: Not on file   Transportation needs    Medical: Not on file    Non-medical: Not on file  Tobacco Use   Smoking status: Former Smoker    Quit date: 06/04/1980    Years since quitting:  39.0   Smokeless tobacco: Never Used  Substance and Sexual Activity   Alcohol use: No   Drug use: No   Sexual activity: Yes  Lifestyle   Physical activity    Days per week: Not on file    Minutes per session: Not on file   Stress: Not on file  Relationships   Social connections    Talks on phone: Not on file    Gets together: Not on file    Attends religious service: Not on file    Active member of club or organization: Not on file    Attends meetings of clubs or organizations: Not on file    Relationship status: Not on file  Other Topics Concern   Not on file  Social History Narrative   Not on file     Review of Systems  Respiratory: Negative.   Cardiovascular: Negative.   Gastrointestinal: Negative.   Genitourinary: Negative.   Musculoskeletal:       Bilateral leg pain.    Vital Signs: BP 114/69    Pulse (!) 112    Temp 99.2 F (37.3 C) (Oral)    Resp (!) 23    Ht 5' 8"  (1.727 m)    Wt 69.9 kg    SpO2 100%    BMI 23.43 kg/m   Physical Exam Constitutional:      Comments: Slightly lethargic.  Cardiovascular:     Rate and Rhythm: Regular rhythm. Tachycardia present.  Pulmonary:     Effort: Pulmonary effort is normal.     Breath sounds: Normal breath sounds.  Abdominal:     General: Abdomen is flat.     Palpations: Abdomen is soft.     Imaging: Ct Chest W Contrast  Result Date: 05/25/2019 CLINICAL DATA:  Chronic dyspnea.  Fever. EXAM: CT CHEST, ABDOMEN, AND PELVIS WITH CONTRAST TECHNIQUE: Multidetector CT imaging of the chest, abdomen and pelvis was performed following the standard protocol during bolus administration of intravenous contrast. CONTRAST:  157m OMNIPAQUE IOHEXOL 300 MG/ML  SOLN COMPARISON:  Chest CT from 9 days ago. Abdomen and pelvis CT 06/25/2010 FINDINGS: CT CHEST FINDINGS Cardiovascular: Normal heart size. Trace pericardial fluid. No acute vascular finding. Mediastinum/Nodes: Stable prominent bilateral hilar lymph nodes which are symmetric and noncalcified. Lungs/Pleura: Unchanged airway thickening with asymmetric right-sided pulmonary reticulation with a mild subpleural predilection. This could reflect atypical infection, sequela of aspiration, or inflammatory pneumonia. No acute airspace disease, edema, effusion, honeycombing, or pneumothorax. Musculoskeletal: Degenerative disease without acute finding CT ABDOMEN PELVIS FINDINGS Hepatobiliary: No focal liver abnormality.No evidence of biliary obstruction or stone. Pancreas: Unremarkable. Spleen: Unremarkable. Adrenals/Urinary Tract: 11 mm left adrenal adenoma that remain size stable. 6 mm left lower pole renal  calculus. No hydronephrosis or ureteral stone. Limited assessment of the bladder due to streak artifact. Tiny right renal cystic density. Stomach/Bowel:  No obstruction. No evidence of bowel inflammation Vascular/Lymphatic: Mild atherosclerosis. Prominent peripancreatic arterial arcade with tortuous hepatic artery, stable. No underlying obstructive process is seen. No mass or adenopathy. Reproductive:Pelvic structures are largely obscured by hip prostheses. Other: No ascites or pneumoperitoneum. Musculoskeletal: Advanced lower lumbar facet and disc degeneration with multilevel listhesis and scoliosis. IMPRESSION: Chest CT: 1. No acute finding or change from 05/16/2019. 2. Asymmetric reticulation in the right lung as described previously, please see follow-up recommendations at that time. Abdominal CT: 1. No emergent finding. 2. Left nephrolithiasis and small left adrenal adenoma. Electronically Signed   By: JMonte FantasiaM.D.   On: 05/25/2019 05:22  Ct Abdomen Pelvis W Contrast  Result Date: 05/25/2019 CLINICAL DATA:  Chronic dyspnea.  Fever. EXAM: CT CHEST, ABDOMEN, AND PELVIS WITH CONTRAST TECHNIQUE: Multidetector CT imaging of the chest, abdomen and pelvis was performed following the standard protocol during bolus administration of intravenous contrast. CONTRAST:  136m OMNIPAQUE IOHEXOL 300 MG/ML  SOLN COMPARISON:  Chest CT from 9 days ago. Abdomen and pelvis CT 06/25/2010 FINDINGS: CT CHEST FINDINGS Cardiovascular: Normal heart size. Trace pericardial fluid. No acute vascular finding. Mediastinum/Nodes: Stable prominent bilateral hilar lymph nodes which are symmetric and noncalcified. Lungs/Pleura: Unchanged airway thickening with asymmetric right-sided pulmonary reticulation with a mild subpleural predilection. This could reflect atypical infection, sequela of aspiration, or inflammatory pneumonia. No acute airspace disease, edema, effusion, honeycombing, or pneumothorax. Musculoskeletal: Degenerative  disease without acute finding CT ABDOMEN PELVIS FINDINGS Hepatobiliary: No focal liver abnormality.No evidence of biliary obstruction or stone. Pancreas: Unremarkable. Spleen: Unremarkable. Adrenals/Urinary Tract: 11 mm left adrenal adenoma that remain size stable. 6 mm left lower pole renal calculus. No hydronephrosis or ureteral stone. Limited assessment of the bladder due to streak artifact. Tiny right renal cystic density. Stomach/Bowel:  No obstruction. No evidence of bowel inflammation Vascular/Lymphatic: Mild atherosclerosis. Prominent peripancreatic arterial arcade with tortuous hepatic artery, stable. No underlying obstructive process is seen. No mass or adenopathy. Reproductive:Pelvic structures are largely obscured by hip prostheses. Other: No ascites or pneumoperitoneum. Musculoskeletal: Advanced lower lumbar facet and disc degeneration with multilevel listhesis and scoliosis. IMPRESSION: Chest CT: 1. No acute finding or change from 05/16/2019. 2. Asymmetric reticulation in the right lung as described previously, please see follow-up recommendations at that time. Abdominal CT: 1. No emergent finding. 2. Left nephrolithiasis and small left adrenal adenoma. Electronically Signed   By: JMonte FantasiaM.D.   On: 05/25/2019 05:22   Dg Chest Portable 1 View  Result Date: 05/25/2019 CLINICAL DATA:  Fever cough and weakness EXAM: PORTABLE CHEST 1 VIEW COMPARISON:  07/12/2013 FINDINGS: The heart size and mediastinal contours are within normal limits. Both lungs are clear. The visualized skeletal structures are unremarkable. IMPRESSION: No active disease. Electronically Signed   By: KDonavan FoilM.D.   On: 05/25/2019 03:05    Labs:  CBC: Recent Labs    05/27/19 0803 06/10/19 1350 06/13/19 1143 06/16/19 0756  WBC 9.5 12.2* 13.0* 14.6*  HGB 8.0* 8.5* 8.9* 9.2*  HCT 25.0* 27.5* 28.6* 29.9*  PLT 424* 804* 702* 686*    COAGS: Recent Labs    01/26/19 1449 06/16/19 0756  INR 1.0 1.4*  APTT  32  --     BMP: Recent Labs    04/28/19 0436 05/16/19 1511 05/24/19 2157 05/25/19 1037 05/27/19 0803  NA 133* 132* 130*  --  130*  K 4.3 4.3 4.1  --  3.5  CL 98 96* 94*  --  97*  CO2 29 25 28   --  27  GLUCOSE 132* 143* 173*  --  142*  BUN 9 10 16   --  6*  CALCIUM 7.8* 8.1* 8.0*  --  7.5*  CREATININE 0.71 0.63 0.79 0.64 0.56*  GFRNONAA >60 >60 >60 >60 >60  GFRAA >60 >60 >60 >60 >60    LIVER FUNCTION TESTS: Recent Labs    07/29/18 0814 05/24/19 2157  BILITOT 0.6 0.8  AST 29 26  ALT 29 27  ALKPHOS 100 258*  PROT 8.1 6.7  ALBUMIN 4.2 2.1*    TUMOR MARKERS: No results for input(s): AFPTM, CEA, CA199, CHROMGRNA in the last 8760 hours.  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  Request for bone marrow biopsy.  Patient is slightly tachycardic but this appears to be his baseline based on most recent oncology visit note.  Risks and benefits of CT-guided bone marrow biopsy was discussed with the patient and/or patient's family including, but not limited to bleeding, infection, damage to adjacent structures or low yield requiring additional tests.  All of the questions were answered and there is agreement to proceed.  Consent signed and in chart.   Thank you for this interesting consult.  I greatly enjoyed meeting [name] Foster and look forward to participating in their care.  A copy of this report was sent to the requesting provider on this date.  Electronically Signed: Burman Riis, MD 06/16/2019, 8:50 AM   I spent a total of  15 Minutes   in face to face in clinical consultation, greater than 50% of which was counseling/coordinating care for CT-guided bone marrow biopsy.

## 2019-06-17 DIAGNOSIS — N400 Enlarged prostate without lower urinary tract symptoms: Secondary | ICD-10-CM | POA: Diagnosis not present

## 2019-06-17 DIAGNOSIS — J208 Acute bronchitis due to other specified organisms: Secondary | ICD-10-CM | POA: Diagnosis not present

## 2019-06-17 DIAGNOSIS — D509 Iron deficiency anemia, unspecified: Secondary | ICD-10-CM | POA: Diagnosis not present

## 2019-06-17 DIAGNOSIS — D3502 Benign neoplasm of left adrenal gland: Secondary | ICD-10-CM | POA: Diagnosis not present

## 2019-06-17 DIAGNOSIS — D075 Carcinoma in situ of prostate: Secondary | ICD-10-CM | POA: Diagnosis not present

## 2019-06-17 DIAGNOSIS — D709 Neutropenia, unspecified: Secondary | ICD-10-CM | POA: Diagnosis not present

## 2019-06-17 DIAGNOSIS — M6281 Muscle weakness (generalized): Secondary | ICD-10-CM | POA: Diagnosis not present

## 2019-06-17 DIAGNOSIS — M0510 Rheumatoid lung disease with rheumatoid arthritis of unspecified site: Secondary | ICD-10-CM | POA: Diagnosis not present

## 2019-06-17 DIAGNOSIS — I10 Essential (primary) hypertension: Secondary | ICD-10-CM | POA: Diagnosis not present

## 2019-06-21 ENCOUNTER — Ambulatory Visit: Admission: RE | Admit: 2019-06-21 | Payer: Medicare HMO | Source: Ambulatory Visit

## 2019-06-21 DIAGNOSIS — D709 Neutropenia, unspecified: Secondary | ICD-10-CM | POA: Diagnosis not present

## 2019-06-21 DIAGNOSIS — D075 Carcinoma in situ of prostate: Secondary | ICD-10-CM | POA: Diagnosis not present

## 2019-06-21 DIAGNOSIS — D3502 Benign neoplasm of left adrenal gland: Secondary | ICD-10-CM | POA: Diagnosis not present

## 2019-06-21 DIAGNOSIS — N400 Enlarged prostate without lower urinary tract symptoms: Secondary | ICD-10-CM | POA: Diagnosis not present

## 2019-06-21 DIAGNOSIS — I10 Essential (primary) hypertension: Secondary | ICD-10-CM | POA: Diagnosis not present

## 2019-06-21 DIAGNOSIS — D509 Iron deficiency anemia, unspecified: Secondary | ICD-10-CM | POA: Diagnosis not present

## 2019-06-21 DIAGNOSIS — J208 Acute bronchitis due to other specified organisms: Secondary | ICD-10-CM | POA: Diagnosis not present

## 2019-06-21 DIAGNOSIS — M6281 Muscle weakness (generalized): Secondary | ICD-10-CM | POA: Diagnosis not present

## 2019-06-21 DIAGNOSIS — M0510 Rheumatoid lung disease with rheumatoid arthritis of unspecified site: Secondary | ICD-10-CM | POA: Diagnosis not present

## 2019-06-22 ENCOUNTER — Telehealth: Payer: Self-pay | Admitting: *Deleted

## 2019-06-22 LAB — JAK2 V617F, W REFLEX TO CALR/E12/MPL

## 2019-06-22 LAB — CALR + JAK2 E12-15 + MPL (REFLEXED)

## 2019-06-22 NOTE — Telephone Encounter (Signed)
[name] Foster please call and change his visit to virtual after you speak with his wife

## 2019-06-22 NOTE — Telephone Encounter (Signed)
OK to do virtual visit if he has device. Thank  You

## 2019-06-22 NOTE — Telephone Encounter (Signed)
Wife called asking if BM biopsy results are back yet and would like to change the appointment on the 19 th to Virtual appointment because of him being so sick. Please advise.

## 2019-06-23 ENCOUNTER — Telehealth: Payer: Self-pay | Admitting: *Deleted

## 2019-06-23 LAB — BCR-ABL1 FISH
Cells Analyzed: 200
Cells Counted: 200

## 2019-06-23 NOTE — Telephone Encounter (Signed)
Patient wife called Hospice asking for services. If in agreement, please fax referral, notes and demographics to them

## 2019-06-24 ENCOUNTER — Other Ambulatory Visit: Payer: Self-pay

## 2019-06-24 ENCOUNTER — Inpatient Hospital Stay
Admission: EM | Admit: 2019-06-24 | Discharge: 2019-06-29 | DRG: 391 | Disposition: A | Discharge status: Home Health | Payer: Medicare HMO | Attending: Internal Medicine | Admitting: Internal Medicine

## 2019-06-24 ENCOUNTER — Inpatient Hospital Stay (HOSPITAL_BASED_OUTPATIENT_CLINIC_OR_DEPARTMENT_OTHER): Payer: Medicare HMO | Admitting: Oncology

## 2019-06-24 ENCOUNTER — Emergency Department: Payer: Medicare HMO

## 2019-06-24 ENCOUNTER — Encounter: Payer: Self-pay | Admitting: Oncology

## 2019-06-24 DIAGNOSIS — Z8042 Family history of malignant neoplasm of prostate: Secondary | ICD-10-CM | POA: Diagnosis not present

## 2019-06-24 DIAGNOSIS — Z7189 Other specified counseling: Secondary | ICD-10-CM | POA: Diagnosis not present

## 2019-06-24 DIAGNOSIS — R634 Abnormal weight loss: Secondary | ICD-10-CM | POA: Diagnosis not present

## 2019-06-24 DIAGNOSIS — Z4682 Encounter for fitting and adjustment of non-vascular catheter: Secondary | ICD-10-CM | POA: Diagnosis not present

## 2019-06-24 DIAGNOSIS — E43 Unspecified severe protein-calorie malnutrition: Secondary | ICD-10-CM | POA: Diagnosis not present

## 2019-06-24 DIAGNOSIS — I10 Essential (primary) hypertension: Secondary | ICD-10-CM | POA: Diagnosis not present

## 2019-06-24 DIAGNOSIS — R1310 Dysphagia, unspecified: Secondary | ICD-10-CM | POA: Diagnosis not present

## 2019-06-24 DIAGNOSIS — N400 Enlarged prostate without lower urinary tract symptoms: Secondary | ICD-10-CM | POA: Diagnosis present

## 2019-06-24 DIAGNOSIS — Z79899 Other long term (current) drug therapy: Secondary | ICD-10-CM | POA: Diagnosis not present

## 2019-06-24 DIAGNOSIS — J849 Interstitial pulmonary disease, unspecified: Secondary | ICD-10-CM | POA: Diagnosis present

## 2019-06-24 DIAGNOSIS — D649 Anemia, unspecified: Secondary | ICD-10-CM | POA: Insufficient documentation

## 2019-06-24 DIAGNOSIS — K3189 Other diseases of stomach and duodenum: Secondary | ICD-10-CM | POA: Diagnosis not present

## 2019-06-24 DIAGNOSIS — Z96643 Presence of artificial hip joint, bilateral: Secondary | ICD-10-CM | POA: Diagnosis present

## 2019-06-24 DIAGNOSIS — J439 Emphysema, unspecified: Secondary | ICD-10-CM | POA: Diagnosis present

## 2019-06-24 DIAGNOSIS — B9681 Helicobacter pylori [H. pylori] as the cause of diseases classified elsewhere: Secondary | ICD-10-CM | POA: Diagnosis not present

## 2019-06-24 DIAGNOSIS — D473 Essential (hemorrhagic) thrombocythemia: Secondary | ICD-10-CM

## 2019-06-24 DIAGNOSIS — Z8701 Personal history of pneumonia (recurrent): Secondary | ICD-10-CM

## 2019-06-24 DIAGNOSIS — K5900 Constipation, unspecified: Secondary | ICD-10-CM | POA: Diagnosis not present

## 2019-06-24 DIAGNOSIS — N200 Calculus of kidney: Secondary | ICD-10-CM | POA: Diagnosis not present

## 2019-06-24 DIAGNOSIS — Z66 Do not resuscitate: Secondary | ICD-10-CM | POA: Diagnosis not present

## 2019-06-24 DIAGNOSIS — D638 Anemia in other chronic diseases classified elsewhere: Secondary | ICD-10-CM | POA: Diagnosis present

## 2019-06-24 DIAGNOSIS — E46 Unspecified protein-calorie malnutrition: Secondary | ICD-10-CM | POA: Diagnosis not present

## 2019-06-24 DIAGNOSIS — E860 Dehydration: Secondary | ICD-10-CM | POA: Diagnosis not present

## 2019-06-24 DIAGNOSIS — Z515 Encounter for palliative care: Secondary | ICD-10-CM | POA: Diagnosis not present

## 2019-06-24 DIAGNOSIS — R627 Adult failure to thrive: Secondary | ICD-10-CM | POA: Diagnosis not present

## 2019-06-24 DIAGNOSIS — Z888 Allergy status to other drugs, medicaments and biological substances status: Secondary | ICD-10-CM

## 2019-06-24 DIAGNOSIS — M059 Rheumatoid arthritis with rheumatoid factor, unspecified: Secondary | ICD-10-CM | POA: Diagnosis not present

## 2019-06-24 DIAGNOSIS — Z681 Body mass index (BMI) 19 or less, adult: Secondary | ICD-10-CM | POA: Diagnosis not present

## 2019-06-24 DIAGNOSIS — R918 Other nonspecific abnormal finding of lung field: Secondary | ICD-10-CM | POA: Diagnosis not present

## 2019-06-24 DIAGNOSIS — R1319 Other dysphagia: Secondary | ICD-10-CM | POA: Diagnosis not present

## 2019-06-24 DIAGNOSIS — Z20828 Contact with and (suspected) exposure to other viral communicable diseases: Secondary | ICD-10-CM | POA: Diagnosis not present

## 2019-06-24 DIAGNOSIS — Z87891 Personal history of nicotine dependence: Secondary | ICD-10-CM

## 2019-06-24 DIAGNOSIS — R110 Nausea: Secondary | ICD-10-CM | POA: Diagnosis present

## 2019-06-24 DIAGNOSIS — D75839 Thrombocytosis, unspecified: Secondary | ICD-10-CM | POA: Insufficient documentation

## 2019-06-24 LAB — BASIC METABOLIC PANEL
Anion gap: 14 (ref 5–15)
BUN: 14 mg/dL (ref 8–23)
CO2: 27 mmol/L (ref 22–32)
Calcium: 8.4 mg/dL — ABNORMAL LOW (ref 8.9–10.3)
Chloride: 93 mmol/L — ABNORMAL LOW (ref 98–111)
Creatinine, Ser: 0.54 mg/dL — ABNORMAL LOW (ref 0.61–1.24)
GFR calc Af Amer: >60 mL/min (ref >60)
GFR calc non Af Amer: >60 mL/min (ref >60)
Glucose, Bld: 122 mg/dL — ABNORMAL HIGH (ref 70–99)
Potassium: 4.1 mmol/L (ref 3.5–5.1)
Sodium: 134 mmol/L — ABNORMAL LOW (ref 135–145)

## 2019-06-24 LAB — HEPATIC FUNCTION PANEL
ALT: 13 U/L (ref 0–44)
AST: 25 U/L (ref 15–41)
Albumin: 1.7 g/dL — ABNORMAL LOW (ref 3.5–5.0)
Alkaline Phosphatase: 126 U/L (ref 38–126)
Bilirubin, Direct: 0.5 mg/dL — ABNORMAL HIGH (ref 0.0–0.2)
Indirect Bilirubin: 0.9 mg/dL (ref 0.3–0.9)
Total Bilirubin: 1.4 mg/dL — ABNORMAL HIGH (ref 0.3–1.2)
Total Protein: 7.2 g/dL (ref 6.5–8.1)

## 2019-06-24 LAB — CBC
HCT: 30.9 % — ABNORMAL LOW (ref 39.0–52.0)
Hemoglobin: 9.3 g/dL — ABNORMAL LOW (ref 13.0–17.0)
MCH: 24.7 pg — ABNORMAL LOW (ref 26.0–34.0)
MCHC: 30.1 g/dL (ref 30.0–36.0)
MCV: 82 fL (ref 80.0–100.0)
Platelets: 744 10*3/uL — ABNORMAL HIGH (ref 150–400)
RBC: 3.77 MIL/uL — ABNORMAL LOW (ref 4.22–5.81)
RDW: 17 % — ABNORMAL HIGH (ref 11.5–15.5)
WBC: 9.9 10*3/uL (ref 4.0–10.5)
nRBC: 0 % (ref 0.0–0.2)

## 2019-06-24 LAB — URINALYSIS, COMPLETE (UACMP) WITH MICROSCOPIC
Bacteria, UA: NONE SEEN
Bilirubin Urine: NEGATIVE
Glucose, UA: NEGATIVE mg/dL
Ketones, ur: 20 mg/dL — AB
Leukocytes,Ua: NEGATIVE
Nitrite: NEGATIVE
Protein, ur: 30 mg/dL — AB
RBC / HPF: >50 RBC/hpf — ABNORMAL HIGH (ref 0–5)
Specific Gravity, Urine: 1.02 (ref 1.005–1.030)
pH: 5 (ref 5.0–8.0)

## 2019-06-24 MED ORDER — ALBUTEROL SULFATE (2.5 MG/3ML) 0.083% IN NEBU: 2.5000 mg | INHALATION_SOLUTION | Freq: Four times a day (QID) | RESPIRATORY_TRACT | Status: DC | PRN

## 2019-06-24 MED ORDER — ENOXAPARIN SODIUM 40 MG/0.4ML ~~LOC~~ SOLN
40.0000 mg | SUBCUTANEOUS | Status: DC
  Administered 2019-06-24: 40 mg via SUBCUTANEOUS
  Filled 2019-06-24: qty 0.4

## 2019-06-24 MED ORDER — POLYETHYLENE GLYCOL 3350 17 G PO PACK: 17.0000 g | PACK | Freq: Every day | ORAL | Status: DC | PRN

## 2019-06-24 MED ORDER — MOMETASONE FURO-FORMOTEROL FUM 200-5 MCG/ACT IN AERO
2.0000 | INHALATION_SPRAY | Freq: Two times a day (BID) | RESPIRATORY_TRACT | Status: DC
  Filled 2019-06-24: qty 8.8

## 2019-06-24 MED ORDER — SODIUM CHLORIDE 0.9 % IV BOLUS
1000.0000 mL | Freq: Once | INTRAVENOUS | Status: AC
  Administered 2019-06-24: 1000 mL via INTRAVENOUS

## 2019-06-24 MED ORDER — SODIUM CHLORIDE 0.9 % IV SOLN
INTRAVENOUS | Status: DC
  Administered 2019-06-24 – 2019-06-26 (×5): via INTRAVENOUS

## 2019-06-24 MED ORDER — ACETAMINOPHEN 325 MG PO TABS: 650.0000 mg | ORAL_TABLET | Freq: Four times a day (QID) | ORAL | Status: DC | PRN

## 2019-06-24 MED ORDER — DOXAZOSIN MESYLATE 4 MG PO TABS
4.0000 mg | ORAL_TABLET | Freq: Every day | ORAL | Status: DC
  Administered 2019-06-25 – 2019-06-29 (×4): 4 mg via ORAL
  Filled 2019-06-24 (×6): qty 1

## 2019-06-24 MED ORDER — FINASTERIDE 5 MG PO TABS
5.0000 mg | ORAL_TABLET | Freq: Every day | ORAL | Status: DC
  Administered 2019-06-25 – 2019-06-29 (×4): 5 mg via ORAL
  Filled 2019-06-24 (×5): qty 1

## 2019-06-24 MED ORDER — ACETAMINOPHEN 650 MG RE SUPP: 650.0000 mg | Freq: Four times a day (QID) | RECTAL | Status: DC | PRN

## 2019-06-24 MED ORDER — ENSURE ENLIVE PO LIQD
237.0000 mL | Freq: Two times a day (BID) | ORAL | Status: DC
  Administered 2019-06-25: 237 mL via ORAL

## 2019-06-24 MED ORDER — ONDANSETRON HCL 4 MG/2ML IJ SOLN: 4.0000 mg | Freq: Four times a day (QID) | INTRAMUSCULAR | Status: DC | PRN

## 2019-06-24 MED ORDER — PANTOPRAZOLE SODIUM 40 MG PO TBEC
40.0000 mg | DELAYED_RELEASE_TABLET | Freq: Every day | ORAL | Status: DC
  Administered 2019-06-25 – 2019-06-29 (×4): 40 mg via ORAL
  Filled 2019-06-24 (×5): qty 1

## 2019-06-24 MED ORDER — ONDANSETRON HCL 4 MG PO TABS: 4.0000 mg | ORAL_TABLET | Freq: Four times a day (QID) | ORAL | Status: DC | PRN

## 2019-06-24 NOTE — ED Notes (Signed)
Pt agrees to use urinal and let this RN know once sample ready.

## 2019-06-24 NOTE — Progress Notes (Signed)
HEMATOLOGY-ONCOLOGY TeleHEALTH VISIT PROGRESS NOTE  I connected with [name] Foster on 06/24/19 at 10:15 AM EDT by video enabled telemedicine visit and verified that I am speaking with the correct person using two identifiers. I discussed the limitations, risks, security and privacy concerns of performing an evaluation and management service by telemedicine and the availability of in-person appointments. I also discussed with the patient that there may be a patient responsible charge related to this service. The patient expressed understanding and agreed to proceed.   Other persons participating in the visit and their role in the encounter:  Daughter and wife, to provide history as patient is too weak to do so.  Patient's location: Home  Provider's location: office Chief Complaint: Follow-up to discuss bone marrow biopsy results.  I attempted to connect the patient for visual enabled telehealth visit via [employer].   I was able to have face-to-face virtual visit with patient and her family for a few minutes.  Due to the technical difficulties with video, encounter was transitioned to audio only visit.   INTERVAL HISTORY [name] Foster is a 78 y.o. male who has above history reviewed by me today presents for follow up visit for management of anemia, discussed bone marrow biopsy results. Problems and complaints are listed below:  Patient reports very weak and fatigued.  Not able to talk much. Daughter reports that patient has been being able to eat or drink anything this week.  No bowel movement since last Saturday. Extremely weak. Patient had bone marrow biopsy done.  Wife had called earlier in the request hospice evaluation.  Review of Systems  Unable to perform ROS: Other (Due to profound weakness)  Not able to talk much.  Denies pain Past Medical History:  Diagnosis Date  . Arthritis   . BPH (benign prostatic hyperplasia)   . Former smoker, stopped smoking many years ago   .  History of needle biopsy of prostate with negative result 2013  . Hypertension   . Prostate enlargement   . Prostatic intraepithelial neoplasm III 2013   Past Surgical History:  Procedure Laterality Date  . HEMORROIDECTOMY    . HERNIA REPAIR    . JOINT REPLACEMENT Left    THR  . PROSTATE BIOPSY  2013  . TOTAL HIP ARTHROPLASTY Left   . TOTAL HIP ARTHROPLASTY Right 04/26/2019   Procedure: TOTAL HIP ARTHROPLASTY ANTERIOR APPROACH;  Surgeon: Hessie Knows, MD;  Location: [facility] ORS;  Service: Orthopedics;  Laterality: Right;    Family History  Problem Relation Age of Onset  . Prostate cancer Father   . Cancer Father   . Prostate cancer Brother   . Cancer Mother     Social History   Socioeconomic History  . Marital status: Married    Spouse name: Not on file  . Number of children: Not on file  . Years of education: Not on file  . Highest education level: Not on file  Occupational History  . Not on file  Social Needs  . Financial resource strain: Not on file  . Food insecurity    Worry: Not on file    Inability: Not on file  . Transportation needs    Medical: Not on file    Non-medical: Not on file  Tobacco Use  . Smoking status: Former Smoker    Quit date: 06/04/1980    Years since quitting: 39.0  . Smokeless tobacco: Never Used  Substance and Sexual Activity  . Alcohol use: No  . Drug use:  No  . Sexual activity: Yes  Lifestyle  . Physical activity    Days per week: Not on file    Minutes per session: Not on file  . Stress: Not on file  Relationships  . Social Herbalist on phone: Not on file    Gets together: Not on file    Attends religious service: Not on file    Active member of club or organization: Not on file    Attends meetings of clubs or organizations: Not on file    Relationship status: Not on file  . Intimate partner violence    Fear of current or ex partner: Not on file    Emotionally abused: Not on file    Physically abused: Not on file     Forced sexual activity: Not on file  Other Topics Concern  . Not on file  Social History Narrative  . Not on file    Current Outpatient Medications on File Prior to Visit  Medication Sig Dispense Refill  . albuterol (VENTOLIN HFA) 108 (90 Base) MCG/ACT inhaler Inhale 2 puffs into the lungs every 6 (six) hours as needed.    . doxazosin (CARDURA) 4 MG tablet Take 1 tablet (4 mg total) by mouth daily. (Patient not taking: Reported on 06/24/2019) 90 tablet 3  . Ensure Max Protein (ENSURE MAX PROTEIN) LIQD Take 1 Bottle by mouth 2 (two) times a day.    . ferrous [id number] C-folic acid (TRINSICON / FOLTRIN) capsule Take 1 capsule by mouth 2 (two) times daily. (Patient not taking: Reported on 06/24/2019) 20 capsule 0  . finasteride (PROSCAR) 5 MG tablet Take 1 tablet (5 mg total) by mouth daily. (Patient not taking: Reported on 06/24/2019) 90 tablet 3  . Fluticasone-Salmeterol (ADVAIR) 250-50 MCG/DOSE AEPB Inhale 1 puff into the lungs 2 (two) times a day.    . ORAL ELECTROLYTES PO Take 8 oz by mouth 1 day or 1 dose.     No current facility-administered medications on file prior to visit.     Allergies  Allergen Reactions  . Methotrexate     Other reaction(s): Other (See Comments) Weakness,        Observations/Objective: There were no vitals filed for this visit. There is no height or weight on file to calculate BMI.  Physical Exam  Neurological: He is alert.    CBC    Component Value Date/Time   WBC 14.6 (H) 06/16/2019 0756   RBC 3.66 (L) 06/16/2019 0756   HGB 9.2 (L) 06/16/2019 0756   HGB 10.7 (L) 04/29/2013 0619   HCT 29.9 (L) 06/16/2019 0756   HCT 39.8 (L) 04/11/2013 1454   PLT 686 (H) 06/16/2019 0756   PLT 187 04/29/2013 0619   MCV 81.7 06/16/2019 0756   MCV 90 04/11/2013 1454   MCH 25.1 (L) 06/16/2019 0756   MCHC 30.8 06/16/2019 0756   RDW 16.5 (H) 06/16/2019 0756   RDW 13.8 04/11/2013 1454   LYMPHSABS 1.6 06/13/2019 1143   MONOABS 0.9 06/13/2019 1143    EOSABS 0.0 06/13/2019 1143   BASOSABS 0.0 06/13/2019 1143    CMP     Component Value Date/Time   NA 130 (L) 05/27/2019 0803   NA 136 04/29/2013 0619   K 3.5 05/27/2019 0803   K 3.8 04/29/2013 0619   CL 97 (L) 05/27/2019 0803   CL 104 04/29/2013 0619   CO2 27 05/27/2019 0803   CO2 27 04/29/2013 0619   GLUCOSE 142 (H) 05/27/2019 7829  GLUCOSE 127 (H) 04/29/2013 0619   BUN 6 (L) 05/27/2019 0803   BUN 11 04/29/2013 0619   CREATININE 0.56 (L) 05/27/2019 0803   CREATININE 0.83 04/29/2013 0619   CALCIUM 7.5 (L) 05/27/2019 0803   CALCIUM 8.2 (L) 04/29/2013 0619   PROT 6.7 05/24/2019 2157   PROT 7.3 07/30/2012 1328   ALBUMIN 2.1 (L) 05/24/2019 2157   ALBUMIN 3.6 07/30/2012 1328   AST 26 05/24/2019 2157   AST 29 07/30/2012 1328   ALT 27 05/24/2019 2157   ALT 36 07/30/2012 1328   ALKPHOS 258 (H) 05/24/2019 2157   ALKPHOS 104 07/30/2012 1328   BILITOT 0.8 05/24/2019 2157   BILITOT 0.5 07/30/2012 1328   GFRNONAA >60 05/27/2019 0803   GFRNONAA >60 04/29/2013 0619   GFRAA >60 05/27/2019 0803   GFRAA >60 04/29/2013 0619     Assessment and Plan: 1. Normocytic anemia   2. Thrombocytosis (HCC)   3. Seropositive rheumatoid arthritis ([place])   4. Other dysphagia     Bone marrow biopsy results were reviewed with family members as well as with patient. Patient has trilineage hematopoiesis, hypercellular bone marrow for his age.  Polyclonal plasmacytosis likely reactive. There is no evidence of lymphoproliferative process.  Overall changes are generally nonspecific and may be related to patient's known history of rheumatoid arthritis.  Abundant iron stores. Jak 2 mutation analysis with reflex to CARL,MPL Jak exon 12-15 are negative. Cytogenetics are pending. Discussed with patient and family members that at least morphologically there is no evidence of lymphoproliferative disease in the marrow. Chronic anemia, thrombocytosis, can be secondary to his autoimmune condition/rheumatoid  arthritis. He is very weak.  And the wife has requested hospice evaluation. I discussed that if patient wants to proceed with hospice, I recommend patient to ask primary care provider to authorize evaluation as there is no confirmed malignancy so far. Swallowing difficulty/dysphagia, I discussed about speech eval, gastroenterology evaluation for possible PEG tube evaluation.  Patient and family members were discussed and think about that.  If patient does not want to proceed with hospice at this point, I encourage patient to go to emergency room for IV fluid and further management.   I discussed the assessment and treatment plan with the patient. The patient was provided an opportunity to ask questions and all were answered. The patient agreed with the plan and demonstrated an understanding of the instructions.  The patient was advised to call back or seek an in-person evaluation if the symptoms worsen or if the condition fails to improve as anticipated.   I provided 25 minutes of face-to-face video visit time during this encounter, and > 50% was spent counseling as documented under my assessment & plan.  Earlie Server, MD 06/24/2019 1:43 PM

## 2019-06-24 NOTE — Progress Notes (Signed)
Advance care planning  Purpose of Encounter Dysphagia, severe protein calorie malnutrition  Parties in Attendance Patient, his wife who is the healthcare power of attorney at bedside.  Daughter was on face time who is an Therapist, sports.  Patients Decisional capacity Alert and oriented.  Able to make medical decisions.  Discussed in detail regarding dysphagia, severe protein calorie malnutrition.  Treatment plan , prognosis discussed.  All questions answered  CODE STATUS discussed.  Patient wishes to be DNR/DNI.  Okay getting a feeding tube.  Orders and CODE STATUS changed  DNR/DNI  Time spent - 17 minutes

## 2019-06-24 NOTE — ED Notes (Signed)
Pt/family given brief update.

## 2019-06-24 NOTE — ED Notes (Addendum)
This RN assessed patient.  Patient bent over holding his chest.  Patient's wife states, "he isn't having chest pain, I said, he was just tired."  Protocol orders adjusted accordingly to weakness protocols.

## 2019-06-24 NOTE — ED Notes (Signed)
EDP Archie Balboa at bedside updating pt and family.

## 2019-06-24 NOTE — H&P (Addendum)
[facility] [place] NAME: [name] Foster    MR#:  [id number]  DATE OF BIRTH:  08-16-1941  DATE OF ADMISSION:  06/24/2019  PRIMARY CARE PHYSICIAN: Cyndie Chime, MD   REQUESTING/REFERRING PHYSICIAN: Dr. Archie Balboa  CHIEF COMPLAINT:   Chief Complaint  Patient presents with  . Feeding tube placement    HISTORY OF PRESENT ILLNESS:  [name] Foster  is a 78 y.o. male with a known history of rheumatoid arthritis, emphysema, interstitial lung disease presents to the emergency room brought in by family due to weakness, dysphagia not tolerating solids.  Patient tolerated thin liquids.  He has loss 50 pounds in the last few months.  Worsening weakness and is unable to walk at this time. He does have some nausea but controlled with Zofran.  Does not complain of any odynophagia.  No abdominal pain.  Patient has been following up with oncology Dr. Tasia Catchings and had a bone marrow biopsy.  No malignancy found.  Dr. Tasia Catchings referred to emergency room for further work-up and possible PEG tube placement. Afebrile.  During prior admission patient had started methotrexate and received 2 doses.  Family believes his multiple problems are secondary to methotrexate.  This was stopped.  PAST MEDICAL HISTORY:   Past Medical History:  Diagnosis Date  . Arthritis   . BPH (benign prostatic hyperplasia)   . Former smoker, stopped smoking many years ago   . History of needle biopsy of prostate with negative result 2013  . Hypertension   . Prostate enlargement   . Prostatic intraepithelial neoplasm III 2013    PAST SURGICAL HISTORY:   Past Surgical History:  Procedure Laterality Date  . HEMORROIDECTOMY    . HERNIA REPAIR    . JOINT REPLACEMENT Left    THR  . PROSTATE BIOPSY  2013  . TOTAL HIP ARTHROPLASTY Left   . TOTAL HIP ARTHROPLASTY Right 04/26/2019   Procedure: TOTAL HIP ARTHROPLASTY ANTERIOR APPROACH;  Surgeon: Hessie Knows, MD;  Location: [facility] ORS;   Service: Orthopedics;  Laterality: Right;    SOCIAL HISTORY:   Social History   Tobacco Use  . Smoking status: Former Smoker    Quit date: 06/04/1980    Years since quitting: 39.0  . Smokeless tobacco: Never Used  Substance Use Topics  . Alcohol use: No    FAMILY HISTORY:   Family History  Problem Relation Age of Onset  . Prostate cancer Father   . Cancer Father   . Prostate cancer Brother   . Cancer Mother     DRUG ALLERGIES:   Allergies  Allergen Reactions  . Methotrexate     Other reaction(s): Other (See Comments) Weakness,     REVIEW OF SYSTEMS:   Review of Systems  Constitutional: Negative for chills and fever.  HENT: Negative for sore throat.   Eyes: Negative for blurred vision, double vision and pain.  Respiratory: Negative for cough, hemoptysis, shortness of breath and wheezing.   Cardiovascular: Negative for chest pain, palpitations, orthopnea and leg swelling.  Gastrointestinal: Negative for abdominal pain, constipation, diarrhea, heartburn, nausea and vomiting.  Genitourinary: Negative for dysuria and hematuria.  Musculoskeletal: Negative for back pain and joint pain.  Skin: Negative for rash.  Neurological: Negative for sensory change, speech change, focal weakness and headaches.  Endo/Heme/Allergies: Does not bruise/bleed easily.  Psychiatric/Behavioral: Negative for depression. The patient is not nervous/anxious.    MEDICATIONS AT HOME:   Prior to Admission medications   Medication Sig Start Date  End Date Taking? Authorizing Provider  albuterol (VENTOLIN HFA) 108 (90 Base) MCG/ACT inhaler Inhale 2 puffs into the lungs every 6 (six) hours as needed. 05/23/19  Yes [provider]  doxazosin (CARDURA) 4 MG tablet Take 1 tablet (4 mg total) by mouth daily. 08/09/18  Yes Stoioff, Ronda Fairly, MD  finasteride (PROSCAR) 5 MG tablet Take 1 tablet (5 mg total) by mouth daily. 08/09/18  Yes Stoioff, Ronda Fairly, MD  Fluticasone-Salmeterol (ADVAIR) 250-50  MCG/DOSE AEPB Inhale 1 puff into the lungs 2 (two) times a day. 06/02/19 06/01/20 Yes [provider]     VITAL SIGNS:  Blood pressure (!) 106/55, pulse (!) 102, temperature 98.1 F (36.7 C), temperature source Oral, resp. rate (!) 22, height 5' 8"  (1.727 m), weight 69 kg, SpO2 98 %.  PHYSICAL EXAMINATION:  Physical Exam  GENERAL:  78 y.o.-year-old patient lying in the bed with no acute distress.  EYES: Pupils equal, round, reactive to light and accommodation. No scleral icterus. Extraocular muscles intact.  HEENT: Head atraumatic, normocephalic. Oropharynx and nasopharynx clear. No oropharyngeal erythema, dry oral mucosa  NECK:  Supple, no jugular venous distention. No thyroid enlargement, no tenderness.  LUNGS: Normal breath sounds bilaterally, no wheezing, rales, rhonchi. No use of accessory muscles of respiration.  CARDIOVASCULAR: S1, S2 normal. No murmurs, rubs, or gallops.  ABDOMEN: Soft, nontender, nondistended. Bowel sounds present. No organomegaly or mass.  EXTREMITIES: No pedal edema, cyanosis, or clubbing. + 2 pedal & radial pulses b/l.   NEUROLOGIC: Cranial nerves II through XII are intact. No focal Motor or sensory deficits appreciated b/l PSYCHIATRIC: The patient is alert and oriented x 3. Flat affect.  SKIN: No obvious rash, lesion, or ulcer.   LABORATORY PANEL:   CBC Recent Labs  Lab 06/24/19 1400  WBC 9.9  HGB 9.3*  HCT 30.9*  PLT 744*   ------------------------------------------------------------------------------------------------------------------  Chemistries  Recent Labs  Lab 06/24/19 1400  NA 134*  K 4.1  CL 93*  CO2 27  GLUCOSE 122*  BUN 14  CREATININE 0.54*  CALCIUM 8.4*  AST 25  ALT 13  ALKPHOS 126  BILITOT 1.4*   ------------------------------------------------------------------------------------------------------------------  Cardiac Enzymes No results for input(s): TROPONINI in the last 168  hours. ------------------------------------------------------------------------------------------------------------------  RADIOLOGY:  Dg Chest Portable 1 View  Result Date: 06/24/2019 CLINICAL DATA:  Feeding tube placement.  Inability to swallow. EXAM: PORTABLE CHEST 1 VIEW COMPARISON:  May 25, 2019 FINDINGS: No feeding tube identified. The heart, hila, mediastinum, lungs, and pleura are unchanged with no acute abnormalities. IMPRESSION: No active disease. Electronically Signed   By: Dorise Bullion III M.D   On: 06/24/2019 15:36     IMPRESSION AND PLAN:   *Dysphagia to solids.  Tolerating thin liquids.  Etiology unclear.  CT scan from last month showed no masses or abnormalities with esophagus.  Will consult GI for EGD. Family requesting PEG tube placement as advised by his oncologist.  At this time I have advised we need to find out why patient has dysphagia prior to a PEG tube.  *Dehydration due to decreased oral intake.  Start IV fluids.  *Interstitial lung disease/emphysema.  Not on oxygen.  No significant shortness of breath.  Nebulizers as needed  *Seropositive rheumatoid arthritis.  Not on treatment at this time.  *Severe protein calorie malnutrition.  Protein supplement ordered.  DVT prophylaxis with Lovenox  All the records are reviewed and case discussed with ED provider. Management plans discussed with the patient, family and they are in agreement.  CODE STATUS: DNR/DNI  TOTAL TIME TAKING CARE OF THIS PATIENT: 40 minutes.   Neita Carp M.D on 06/24/2019 at 7:46 PM  Between 7am to 6pm - Pager - [phone]  After 6pm go to [email or web address] - password [email or web address] [facility]  Office  [phone]  CC: Primary care physician; Cyndie Chime, MD  Note: This dictation was prepared with Dragon dictation along with smaller phrase technology. Any transcriptional errors that result from this process are unintentional.

## 2019-06-24 NOTE — ED Notes (Signed)
Vicente Males, RN first nurse aware that pt did not have protocols ordered, she acknowledges priority of patient placement into bed

## 2019-06-24 NOTE — ED Notes (Signed)
Patient stopped staff and told them that patient is experiencing chest pain.  This RN notified.

## 2019-06-24 NOTE — ED Notes (Signed)
ED TO INPATIENT HANDOFF REPORT  ED Nurse Name and Phone #: Metta Clines [phone]  S Name/Age/Gender [name] 78 y.o. male Room/Bed: ED14A/ED14A  Code Status   Code Status: Prior  Home/SNF/Other Home Patient oriented to: self, place, time and situation Is this baseline? Yes   Triage Complete: Triage complete  Chief Complaint feeding tube placement  Triage Note Here with family for feeding tube placement, pt not eating or drinking due to inability to swallow. X 3 weeks.    Allergies Allergies  Allergen Reactions  . Methotrexate     Other reaction(s): Other (See Comments) Weakness,     Level of Care/Admitting Diagnosis ED Disposition    None      B Medical/Surgery History Past Medical History:  Diagnosis Date  . Arthritis   . BPH (benign prostatic hyperplasia)   . Former smoker, stopped smoking many years ago   . History of needle biopsy of prostate with negative result 2013  . Hypertension   . Prostate enlargement   . Prostatic intraepithelial neoplasm III 2013   Past Surgical History:  Procedure Laterality Date  . HEMORROIDECTOMY    . HERNIA REPAIR    . JOINT REPLACEMENT Left    THR  . PROSTATE BIOPSY  2013  . TOTAL HIP ARTHROPLASTY Left   . TOTAL HIP ARTHROPLASTY Right 04/26/2019   Procedure: TOTAL HIP ARTHROPLASTY ANTERIOR APPROACH;  Surgeon: Hessie Knows, MD;  Location: [facility] ORS;  Service: Orthopedics;  Laterality: Right;     A IV Location/Drains/Wounds Patient Lines/Drains/Airways Status   Active Line/Drains/Airways    Name:   Placement date:   Placement time:   Site:   Days:   Peripheral IV 06/16/19 Anterior;Right Wrist   06/16/19    0804    Wrist   8   Peripheral IV 06/24/19 Right Antecubital   06/24/19    1403    Antecubital   less than 1   Negative Pressure Wound Therapy Hip Right   04/26/19    0850    -   59   Airway 7.5 mm   04/26/19    0728     59   Incision (Closed) 04/26/19 Hip Right   04/26/19    0848     59           Intake/Output Last 24 hours  Intake/Output Summary (Last 24 hours) at 06/24/2019 1858 Last data filed at 06/24/2019 1700 Gross per 24 hour  Intake -  Output 100 ml  Net -100 ml    Labs/Imaging Results for orders placed or performed during the hospital encounter of 06/24/19 (from the past 48 hour(s))  Basic metabolic panel     Status: Abnormal   Collection Time: 06/24/19  2:00 PM  Result Value Ref Range   Sodium 134 (L) 135 - 145 mmol/L   Potassium 4.1 3.5 - 5.1 mmol/L   Chloride 93 (L) 98 - 111 mmol/L   CO2 27 22 - 32 mmol/L   Glucose, Bld 122 (H) 70 - 99 mg/dL   BUN 14 8 - 23 mg/dL   Creatinine, Ser 0.54 (L) 0.61 - 1.24 mg/dL   Calcium 8.4 (L) 8.9 - 10.3 mg/dL   GFR calc non Af Amer >60 >60 mL/min   GFR calc Af Amer >60 >60 mL/min   Anion gap 14 5 - 15    Comment: Performed at [facility], [address] [place], [place] [address]  CBC     Status: Abnormal   Collection  Time: 06/24/19  2:00 PM  Result Value Ref Range   WBC 9.9 4.0 - 10.5 K/uL   RBC 3.77 (L) 4.22 - 5.81 MIL/uL   Hemoglobin 9.3 (L) 13.0 - 17.0 g/dL   HCT 30.9 (L) 39.0 - 52.0 %   MCV 82.0 80.0 - 100.0 fL   MCH 24.7 (L) 26.0 - 34.0 pg   MCHC 30.1 30.0 - 36.0 g/dL   RDW 17.0 (H) 11.5 - 15.5 %   Platelets 744 (H) 150 - 400 K/uL   nRBC 0.0 0.0 - 0.2 %    Comment: Performed at [facility], [address] [place], [place] [address]  Hepatic function panel     Status: Abnormal   Collection Time: 06/24/19  2:00 PM  Result Value Ref Range   Total Protein 7.2 6.5 - 8.1 g/dL   Albumin 1.7 (L) 3.5 - 5.0 g/dL   AST 25 15 - 41 U/L   ALT 13 0 - 44 U/L   Alkaline Phosphatase 126 38 - 126 U/L   Total Bilirubin 1.4 (H) 0.3 - 1.2 mg/dL   Bilirubin, Direct 0.5 (H) 0.0 - 0.2 mg/dL   Indirect Bilirubin 0.9 0.3 - 0.9 mg/dL    Comment: Performed at [facility], [address] [place], [place] [address]  Urinalysis, Complete w Microscopic     Status: Abnormal   Collection Time:  06/24/19  2:00 PM  Result Value Ref Range   Color, Urine AMBER (A) YELLOW    Comment: BIOCHEMICALS MAY BE AFFECTED BY COLOR   APPearance HAZY (A) CLEAR   Specific Gravity, Urine 1.020 1.005 - 1.030   pH 5.0 5.0 - 8.0   Glucose, UA NEGATIVE NEGATIVE mg/dL   Hgb urine dipstick LARGE (A) NEGATIVE   Bilirubin Urine NEGATIVE NEGATIVE   Ketones, ur 20 (A) NEGATIVE mg/dL   Protein, ur 30 (A) NEGATIVE mg/dL   Nitrite NEGATIVE NEGATIVE   Leukocytes,Ua NEGATIVE NEGATIVE   RBC / HPF >50 (H) 0 - 5 RBC/hpf   WBC, UA 0-5 0 - 5 WBC/hpf   Bacteria, UA NONE SEEN NONE SEEN   Squamous Epithelial / LPF 0-5 0 - 5   Mucus PRESENT     Comment: Performed at [facility], [address], [place],  [address]   Dg Chest Portable 1 View  Result Date: 06/24/2019 CLINICAL DATA:  Feeding tube placement.  Inability to swallow. EXAM: PORTABLE CHEST 1 VIEW COMPARISON:  May 25, 2019 FINDINGS: No feeding tube identified. The heart, hila, mediastinum, lungs, and pleura are unchanged with no acute abnormalities. IMPRESSION: No active disease. Electronically Signed   By: Dorise Bullion III M.D   On: 06/24/2019 15:36    Pending Labs Unresulted Labs (From admission, onward)    Start     Ordered   06/24/19 1839  SARS CORONAVIRUS 2 Nasal Swab Aptima Multi Swab  (Asymptomatic/Tier 2 Patients Labs)  Once,   STAT    Question Answer Comment  Is this test for diagnosis or screening Screening   Symptomatic for COVID-19 as defined by CDC Unknown   Hospitalized for COVID-19 Unknown   Admitted to ICU for COVID-19 Unknown   Previously tested for COVID-19 Unknown   Resident in a congregate (group) care setting Unknown   Employed in healthcare setting Unknown      06/24/19 1838          Vitals/Pain Today's Vitals   06/24/19 1800 06/24/19 1815 06/24/19 1830 06/24/19 1845  BP: (!) 105/47  Marland Kitchen)  103/53   Pulse: (!) 101 99 100 95  Resp: (!) 30 (!) 28 (!) 25 (!) 21  Temp:      TempSrc:      SpO2: 97% 97%  97% 98%  Weight:      Height:      PainSc:        Isolation Precautions No active isolations  Medications Medications  sodium chloride 0.9 % bolus 1,000 mL (0 mLs Intravenous Stopped 06/24/19 1530)    Mobility Usually walks with device; inc weakness as of the last few weeks Low fall risk   Focused Assessments Weakness; ST; dec appetite--family requesting feeding tube placement; failure to thrive  Dec urine output   R Recommendations: See Admitting Provider Note  Report given to:   Additional Notes:  L ac 22g IV

## 2019-06-24 NOTE — ED Triage Notes (Signed)
Here with family for feeding tube placement, pt not eating or drinking due to inability to swallow. X 3 weeks.

## 2019-06-24 NOTE — ED Notes (Signed)
Pt and family given brief update.

## 2019-06-24 NOTE — ED Notes (Signed)
Pt continues to rest in bed with family at bedside. Bed locked low. Call bell within reach. Rail up.

## 2019-06-24 NOTE — Telephone Encounter (Signed)
Dr. Tasia Catchings will discuss during dox visit this morning.

## 2019-06-24 NOTE — Progress Notes (Signed)
Patient scheduled for doximity visit this morning, call placed to patient and family to review chart. All of patients information reviewed with patients wife as she states patient is unable to participate. Name and DOB verified, patients wife reports he is currently having difficulty swallowing and is unable to take any of his medications.

## 2019-06-24 NOTE — ED Notes (Signed)
Pt/family given more warm blankets. 

## 2019-06-24 NOTE — ED Notes (Signed)
Pt being moved to room 16, room 16 became occupied-so patient moved to subwait with family member who accompanies him. No protocols ordered at this time as patient was going straight to tx room.

## 2019-06-24 NOTE — ED Notes (Signed)
Secretary placing request for transport to room 203. Pt/family updated.

## 2019-06-24 NOTE — ED Notes (Addendum)
Family c/o pt having dec in appetite/eating/weakness over last few weeks post new med methotrexate. Family states that pt was supposed to be placed on "2.5 dose but was accidentally placed on 10 instead." States that since then pt stopped taking it but has remained weak and unable to eat/drink. EDP Archie Balboa at bedside.

## 2019-06-24 NOTE — Plan of Care (Signed)
  Problem: Education: Goal: Knowledge of [place] [facility] Education information/materials will improve Outcome: Progressing Goal: Emotional status will improve Outcome: Progressing Goal: Mental status will improve Outcome: Progressing Goal: Verbalization of understanding the information provided will improve Outcome: Progressing   Problem: Activity: Goal: Interest or engagement in activities will improve Outcome: Progressing Goal: Sleeping patterns will improve Outcome: Progressing   Problem: Coping: Goal: Ability to verbalize frustrations and anger appropriately will improve Outcome: Progressing Goal: Ability to demonstrate self-control will improve Outcome: Progressing   Problem: Health Behavior/Discharge Planning: Goal: Identification of resources available to assist in meeting health care needs will improve Outcome: Progressing Goal: Compliance with treatment plan for underlying cause of condition will improve Outcome: Progressing   Problem: Physical Regulation: Goal: Ability to maintain clinical measurements within normal limits will improve Outcome: Progressing   Problem: Safety: Goal: Periods of time without injury will increase Outcome: Progressing   

## 2019-06-24 NOTE — ED Notes (Signed)
Pt attempting to use urinal with wife's assistance. Pt understands that if he cannot provide urine sample an I&O cath will need to be completed.

## 2019-06-24 NOTE — ED Provider Notes (Signed)
[facility] Emergency Department Provider Note   ____________________________________________   I have reviewed the triage vital signs and the nursing notes.   HISTORY  Chief Complaint Feeding tube placement   History limited by and level 5 caveat: Patient not talking  HPI [name] Foster is a 78 y.o. male who presents to the emergency department today with family for feeding tube placement. Family states that they were discussing with oncologist today who recommended the patient come in for feeding tube placement. The patient apparently has not eaten for the past 5 days. Has been taking sips of water and ginger ale. Family says that the patient has a hard time swallowing. They have also noticed increased weakness.    Records reviewed. Per medical record review patient has a history of recent hospice referral  Past Medical History:  Diagnosis Date  . Arthritis   . BPH (benign prostatic hyperplasia)   . Former smoker, stopped smoking many years ago   . History of needle biopsy of prostate with negative result 2013  . Hypertension   . Prostate enlargement   . Prostatic intraepithelial neoplasm III 2013    Patient Active Problem List   Diagnosis Date Noted  . Palliative care by specialist 06/02/2019  . Iron deficiency anemia 06/02/2019  . 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 Generalized weakness 06/02/2019  . 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 Pneumonia 05/25/2019  . Sepsis ([place])   . Goals of care, counseling/discussion   . DNR (do not resuscitate) discussion   . Atypical pneumonia   . H/O bilateral hip replacements 04/26/2019  . 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 Seropositive rheumatoid arthritis ([place]) 04/21/2019  . Incomplete emptying of bladder 02/03/2017  . 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 Abnormal finding on chest xray 07/18/2013  . Chronic cough 07/18/2013  . Neutropenia ([place]) 09/03/2012  .  Tests ordered 09/03/2012  . 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 Benign localized prostatic hyperplasia with lower urinary tract symptoms (LUTS) 07/07/2012  . Elevated prostate specific antigen (PSA) 07/07/2012    Past Surgical History:  Procedure Laterality Date  . HEMORROIDECTOMY    . HERNIA REPAIR    . JOINT REPLACEMENT Left    THR  . PROSTATE BIOPSY  2013  . TOTAL HIP ARTHROPLASTY Left   . TOTAL HIP ARTHROPLASTY Right 04/26/2019   Procedure: TOTAL HIP ARTHROPLASTY ANTERIOR APPROACH;  Surgeon: Hessie Knows, MD;  Location: [facility] ORS;  Service: Orthopedics;  Laterality: Right;    Prior to Admission medications   Medication Sig Start Date End Date Taking? Authorizing Provider  albuterol (VENTOLIN HFA) 108 (90 Base) MCG/ACT inhaler Inhale 2 puffs into the lungs every 6 (six) hours as needed. 05/23/19   [provider]  doxazosin (CARDURA) 4 MG tablet Take 1 tablet (4 mg total) by mouth daily. Patient not taking: Reported on 06/24/2019 08/09/18   [name], MD  Ensure Max Protein (ENSURE MAX PROTEIN) LIQD Take 1 Bottle by mouth 2 (two) times a day. 06/03/19   [provider]  ferrous [id number] C-folic acid (TRINSICON / FOLTRIN) capsule Take 1 capsule by mouth 2 (two) times daily. Patient not taking: Reported on 06/24/2019 04/28/19   Duanne Guess, PA-C  finasteride (PROSCAR) 5 MG tablet Take 1 tablet (5 mg total) by mouth daily. Patient not taking: Reported on 06/24/2019 08/09/18   [name], MD  Fluticasone-Salmeterol (ADVAIR) 250-50 MCG/DOSE AEPB Inhale 1 puff into the lungs 2 (  two) times a day. 06/02/19 06/01/20  [provider]  ORAL ELECTROLYTES PO Take 8 oz by mouth 1 day or 1 dose. 06/03/19   [provider]    Allergies Methotrexate  Family History  Problem Relation Age of Onset  . Prostate cancer Father   . Cancer Father   . Prostate cancer Brother   . Cancer Mother     Social  History Social History   Tobacco Use  . Smoking status: Former Smoker    Quit date: 06/04/1980    Years since quitting: 39.0  . Smokeless tobacco: Never Used  Substance Use Topics  . Alcohol use: No  . Drug use: No    Review of Systems Unable to obtain secondary to not communicating. ____________________________________________   PHYSICAL EXAM:  VITAL SIGNS: ED Triage Vitals  Enc Vitals Group     BP 06/24/19 1237 (!) 106/57     Pulse Rate 06/24/19 1237 (!) 136     Resp 06/24/19 1237 20     Temp 06/24/19 1237 98.1 F (36.7 C)     Temp Source 06/24/19 1237 Oral     SpO2 06/24/19 1237 98 %     Weight 06/24/19 1238 152 lb 1.9 oz (69 kg)     Height 06/24/19 1238 5\' 8"  (1.727 m)     Head Circumference --      Peak Flow --      Pain Score 06/24/19 1238 0   Constitutional: Somnolent Eyes: Conjunctivae are normal.  ENT      Head: Normocephalic and atraumatic.      Nose: No congestion/rhinnorhea.      Mouth/Throat: Mucous membranes are moist.      Neck: No stridor. Hematological/Lymphatic/Immunilogical: No cervical lymphadenopathy. Cardiovascular: Tachycardic, regular rhythm.  No murmurs, rubs, or gallops.  Respiratory: Normal respiratory effort without tachypnea nor retractions. Breath sounds are clear and equal bilaterally. No wheezes/rales/rhonchi. Gastrointestinal: Soft and non tender. No rebound. No guarding.  Genitourinary: Deferred Musculoskeletal: Normal range of motion in all extremities. No lower extremity edema. Neurologic:  Somnolent. Skin:  Skin is warm, dry and intact. No rash noted. Psychiatric: Mood and affect are normal. Speech and behavior are normal. Patient exhibits appropriate insight and judgment.  ____________________________________________    LABS (pertinent positives/negatives)  BMP na 134, k 4.1, cr 0.54 CBC wbc 9.9, hgb 9.3, plt 744 UA hazy, large dipstick, ketones 20, protein 30, negative nitrite, negative leukocytes, >50rbc Hepatic  function panel, alb 1.7, t bili 1.4  ____________________________________________   EKG  I, Nance Pear, attending physician, personally viewed and interpreted this EKG  EKG Time: 1326 Rate: 137 Rhythm: sinus tachycardia Axis: normal Intervals: qtc 449 QRS: narrow ST changes: no st elevation Impression: abnormal ekg  ____________________________________________    RADIOLOGY  CXR No acute abnormality  ____________________________________________   PROCEDURES  Procedures  ____________________________________________   INITIAL IMPRESSION / ASSESSMENT AND PLAN / ED COURSE  Pertinent labs & imaging results that were available during my care of the patient were reviewed by me and considered in my medical decision making (see chart for details).   Patient presented to the emergency department today accompanied by family because of concern for decreased oral intake over the past week. On initial exam patient was quite tachycardic. This did however improve significantly after IV fluids. Work up negative for infectious disease. Given significance of decreased oral intake will plan on admission. ____________________________________________   FINAL CLINICAL IMPRESSION(S) / ED DIAGNOSES  Final diagnoses:  Dysphagia, unspecified type  Note: This dictation was prepared with Dragon dictation. Any transcriptional errors that result from this process are unintentional     Nance Pear, MD 06/24/19 1930

## 2019-06-25 DIAGNOSIS — R1310 Dysphagia, unspecified: Principal | ICD-10-CM

## 2019-06-25 LAB — CBC
HCT: 25.2 % — ABNORMAL LOW (ref 39.0–52.0)
Hemoglobin: 7.5 g/dL — ABNORMAL LOW (ref 13.0–17.0)
MCH: 24 pg — ABNORMAL LOW (ref 26.0–34.0)
MCHC: 29.8 g/dL — ABNORMAL LOW (ref 30.0–36.0)
MCV: 80.5 fL (ref 80.0–100.0)
Platelets: 617 10*3/uL — ABNORMAL HIGH (ref 150–400)
RBC: 3.13 MIL/uL — ABNORMAL LOW (ref 4.22–5.81)
RDW: 16.9 % — ABNORMAL HIGH (ref 11.5–15.5)
WBC: 9.3 10*3/uL (ref 4.0–10.5)
nRBC: 0 % (ref 0.0–0.2)

## 2019-06-25 LAB — BASIC METABOLIC PANEL
Anion gap: 9 (ref 5–15)
BUN: 12 mg/dL (ref 8–23)
CO2: 28 mmol/L (ref 22–32)
Calcium: 7.8 mg/dL — ABNORMAL LOW (ref 8.9–10.3)
Chloride: 99 mmol/L (ref 98–111)
Creatinine, Ser: 0.48 mg/dL — ABNORMAL LOW (ref 0.61–1.24)
GFR calc Af Amer: >60 mL/min (ref >60)
GFR calc non Af Amer: >60 mL/min (ref >60)
Glucose, Bld: 101 mg/dL — ABNORMAL HIGH (ref 70–99)
Potassium: 3.2 mmol/L — ABNORMAL LOW (ref 3.5–5.1)
Sodium: 136 mmol/L (ref 135–145)

## 2019-06-25 LAB — SARS CORONAVIRUS 2 (TAT 6-24 HRS): SARS Coronavirus 2: NEGATIVE

## 2019-06-25 MED ORDER — OLANZAPINE 2.5 MG PO TABS
2.5000 mg | ORAL_TABLET | Freq: Every day | ORAL | Status: DC
  Administered 2019-06-25 – 2019-06-28 (×2): 2.5 mg via ORAL
  Filled 2019-06-25 (×5): qty 1

## 2019-06-25 MED ORDER — POTASSIUM CHLORIDE 20 MEQ PO PACK
40.0000 meq | PACK | Freq: Once | ORAL | Status: AC
  Administered 2019-06-25: 40 meq via ORAL
  Filled 2019-06-25: qty 2

## 2019-06-25 NOTE — Progress Notes (Signed)
Dr. Marius Ditch approved for the patient to drink ensure even though the patient is on clear liquid diet.

## 2019-06-25 NOTE — Progress Notes (Signed)
[place] at [place] NAME: [name] Foster    MR#:  [id number]  DATE OF BIRTH:  04/30/1941  SUBJECTIVE:  CHIEF COMPLAINT:   Chief Complaint  Patient presents with  . Feeding tube placement   No new complaint this morning.  Patient still reports having difficulty swallowing solids.  Patient stated gastroenterologist saw him already this morning and making plans for endoscopy.  REVIEW OF SYSTEMS:  ROS Constitutional: Negative for chills and fever.  HENT: Negative for sore throat.   Eyes: Negative for blurred vision, double vision and pain.  Respiratory: Negative for cough, hemoptysis, shortness of breath and wheezing.   Cardiovascular: Negative for chest pain, palpitations, orthopnea and leg swelling.  Gastrointestinal: Positive for difficulty swallowing.  Negative for abdominal pain, constipation, diarrhea, heartburn, nausea and vomiting.  Genitourinary: Negative for dysuria and hematuria.  Musculoskeletal: Negative for back pain and joint pain.  Skin: Negative for rash.  Neurological: Negative for sensory change, speech change, focal weakness and headaches.  Endo/Heme/Allergies: Does not bruise/bleed easily.  Psychiatric/Behavioral: Negative for depression. The patient is not nervous  DRUG ALLERGIES:   Allergies  Allergen Reactions  . Methotrexate     Other reaction(s): Other (See Comments) Weakness,    VITALS:  Blood pressure 105/63, pulse 99, temperature 98.4 F (36.9 C), temperature source Oral, resp. rate 18, height 5\' 8"  (1.727 m), weight 58.5 kg, SpO2 99 %. PHYSICAL EXAMINATION:  Physical Exam   GENERAL:  78 y.o.-year-old patient lying in the bed with no acute distress.  EYES: Pupils equal, round, reactive to light and accommodation. No scleral icterus. Extraocular muscles intact.  HEENT: Head atraumatic, normocephalic. Oropharynx and nasopharynx clear. No oropharyngeal erythema, dry oral mucosa  NECK:  Supple, no  jugular venous distention. No thyroid enlargement, no tenderness.  LUNGS: Normal breath sounds bilaterally, no wheezing, rales, rhonchi. No use of accessory muscles of respiration.  CARDIOVASCULAR: S1, S2 normal. No murmurs, rubs, or gallops.  ABDOMEN: Soft, nontender, nondistended. Bowel sounds present. No organomegaly or mass.  EXTREMITIES: No pedal edema, cyanosis, or clubbing. + 2 pedal & radial pulses b/l.   NEUROLOGIC: Cranial nerves II through XII are intact. No focal Motor or sensory deficits appreciated b/l PSYCHIATRIC: The patient is alert and oriented x 3.   Moving all extremities with no focal deficit SKIN: No obvious rash, lesion, or ulcer.   LABORATORY PANEL:  Male CBC Recent Labs  Lab 06/25/19 0636  WBC 9.3  HGB 7.5*  HCT 25.2*  PLT 617*   ------------------------------------------------------------------------------------------------------------------ Chemistries  Recent Labs  Lab 06/24/19 1400 06/25/19 0636  NA 134* 136  K 4.1 3.2*  CL 93* 99  CO2 27 28  GLUCOSE 122* 101*  BUN 14 12  CREATININE 0.54* 0.48*  CALCIUM 8.4* 7.8*  AST 25  --   ALT 13  --   ALKPHOS 126  --   BILITOT 1.4*  --    RADIOLOGY:  Dg Chest Portable 1 View  Result Date: 06/24/2019 CLINICAL DATA:  Feeding tube placement.  Inability to swallow. EXAM: PORTABLE CHEST 1 VIEW COMPARISON:  May 25, 2019 FINDINGS: No feeding tube identified. The heart, hila, mediastinum, lungs, and pleura are unchanged with no acute abnormalities. IMPRESSION: No active disease. Electronically Signed   By: Dorise Bullion III M.D   On: 06/24/2019 15:36   ASSESSMENT AND PLAN:   *Dysphagia to solids.   Tolerating thin liquids.  Etiology unclear.  Patient had CT of the chest and abdomen and  pelvis with contrast done on admission with no acute findings compared to prior CT scan.  Noted asymmetric reticulation in the right lung as described previously.  Referred to follow-up recommendations at that time.   Nonemergent findings on the CT abdomen. Noted order for esophageal studies with barium; cannot be done on the weekend. Follow-up on gastroenterology evaluation and recommendations.  *Dehydration due to decreased oral intake.    Continue IV fluids.  *Interstitial lung disease/emphysema.  Not on oxygen.  No significant shortness of breath.  Nebulizers as needed  *Seropositive rheumatoid arthritis.  Not on treatment at this time.  *Severe protein calorie malnutrition.  Protein supplement ordered.  *Anemia Likely due to anemia of chronic disease. Noted drop in hemoglobin to 7.5 this morning. Requested for iron studies.  Hemoccult test.  DVT prophylaxis ; Lovenox discontinued due to drop in hemoglobin. Ordered SCDs   All the records are reviewed and case discussed with Care Management/Social Worker. Management plans discussed with the patient, family and they are in agreement.  CODE STATUS: DNR  TOTAL TIME TAKING CARE OF THIS PATIENT: 36 minutes.   More than 50% of the time was spent in counseling/coordination of care: YES  POSSIBLE D/C IN 2 DAYS, DEPENDING ON CLINICAL CONDITION.   Kaelie Henigan M.D on 06/25/2019 at 10:17 AM  Between 7am to 6pm - Pager - [phone]  After 6pm go to [email or web address] - Proofreader  [facility]  Office  [phone]  CC: Primary care physician; Cyndie Chime, MD  Note: This dictation was prepared with Dragon dictation along with smaller phrase technology. Any transcriptional errors that result from this process are unintentional.

## 2019-06-25 NOTE — Plan of Care (Signed)
The patient was assisted to the chair today. Tolerating clear liquid diet. No falls. Bed alarm on. Aox4. Uses the urinal. Wife was at bedside today. Consent obtained for EGD tomorrow. The patient will be NPO after midnight.  Problem: Education: Goal: Knowledge of [place] [facility] Education information/materials will improve Outcome: Progressing Goal: Emotional status will improve Outcome: Progressing Goal: Mental status will improve Outcome: Progressing Goal: Verbalization of understanding the information provided will improve Outcome: Progressing   Problem: Activity: Goal: Interest or engagement in activities will improve Outcome: Progressing Goal: Sleeping patterns will improve Outcome: Progressing   Problem: Coping: Goal: Ability to verbalize frustrations and anger appropriately will improve Outcome: Progressing Goal: Ability to demonstrate self-control will improve Outcome: Progressing   Problem: Health Behavior/Discharge Planning: Goal: Identification of resources available to assist in meeting health care needs will improve Outcome: Progressing Goal: Compliance with treatment plan for underlying cause of condition will improve Outcome: Progressing   Problem: Physical Regulation: Goal: Ability to maintain clinical measurements within normal limits will improve Outcome: Progressing   Problem: Safety: Goal: Periods of time without injury will increase Outcome: Progressing   Problem: Education: Goal: Knowledge of General Education information will improve Description: Including pain rating scale, medication(s)/side effects and non-pharmacologic comfort measures Outcome: Progressing   Problem: Health Behavior/Discharge Planning: Goal: Ability to manage health-related needs will improve Outcome: Progressing   Problem: Clinical Measurements: Goal: Ability to maintain clinical measurements within normal limits will improve Outcome: Progressing Goal: Will remain free  from infection Outcome: Progressing Goal: Diagnostic test results will improve Outcome: Progressing Goal: Respiratory complications will improve Outcome: Progressing Goal: Cardiovascular complication will be avoided Outcome: Progressing   Problem: Activity: Goal: Risk for activity intolerance will decrease Outcome: Progressing   Problem: Nutrition: Goal: Adequate nutrition will be maintained Outcome: Progressing   Problem: Coping: Goal: Level of anxiety will decrease Outcome: Progressing   Problem: Elimination: Goal: Will not experience complications related to bowel motility Outcome: Progressing Goal: Will not experience complications related to urinary retention Outcome: Progressing   Problem: Pain Managment: Goal: General experience of comfort will improve Outcome: Progressing   Problem: Safety: Goal: Ability to remain free from injury will improve Outcome: Progressing   Problem: Skin Integrity: Goal: Risk for impaired skin integrity will decrease Outcome: Progressing

## 2019-06-25 NOTE — Progress Notes (Signed)
PT Cancellation Note  Patient Details Name: [name] Foster MRN: [id number] DOB: Dec 21, 1940   Cancelled Treatment:    Reason Eval/Treat Not Completed: Patient declined, no reason specified.  PT consult received.  Chart reviewed.  Upon PT arrival, pt in bed eating; visitor present.  Therapist was going to come back later (d/t pt eating) but pt then declined any PT--initially when therapist asked why pt did not appear happy with therapist but then pt reported he could not stand and was weak.  Therapist educated pt that physical therapy would be able to work on improving this but pt did not appear happy with therapist and looked away.  Visitor told therapist to try another time.  Will re-attempt PT evaluation at a later date/time as able.  Leitha Bleak, PT 06/25/19, 10:32 AM [phone]

## 2019-06-25 NOTE — Evaluation (Signed)
Physical Therapy Evaluation Patient Details Name: [name] Foster MRN: [id number] DOB: 1941/05/31 Today's Date: 06/25/2019   History of Present Illness  Pt is a 78 y.o. male presenting to hospital 06/24/19 d/t concerns with difficulty eating/drinking and increased weakness (unable to walk); 50# weight loss last few months; question about feeding tube placement.  Pt admitted with dysphagia to solids and dehydration.  PMH includes R THA 04/26/19 (Dr. Rudene Christians) WBAT, h/o L THA, BPH, htn, iron deficiency anemia, autoimmune neutropenia, RA, emphysema.  Clinical Impression  Prior to hospital admission, pt was most recently requiring assist for stand pivot transfer to a chair the last couple weeks (unable to ambulate); prior to 2 weeks ago pt was able to ambulate household distances with walker and someone walking behind pt.  Pt lives with his wife in 1 level home with 2 steps (R railing) to enter.  Currently pt is mod assist semi-supine to sit; min assist to stand up to RW (CGA of 2nd for safety); and CGA x2 to take steps bed to recliner with RW.  No knee buckling noted.  Pt appearing with generalized weakness and decreased activity tolerance; limited assessment d/t pt stating "no" to therapist intermittently when attempting to assess pt's strength and pt often looking away from therapist with flat affect.  Pt's wife present and appearing supportive and tending to answer questions instead of pt (d/t pt not appearing to want to talk much).  Pt would benefit from skilled PT to address noted impairments and functional limitations (see below for any additional details).  Upon hospital discharge, recommend pt discharge home with 24/7 assist; HHPT; and recommend light wheelchair (anticipate pt will not be strong enough and also not have the endurance to be able to self propel a regular weight wheelchair).   Patient suffers from progressive weakness which impairs his ability to perform daily activities like toileting,  feeding, dressing, grooming, bathing in the home. A cane, walker, crutch will not resolve the patient's issue with performing activities of daily living. A lightweight wheelchair is required/recommended and will allow patient to safely perform daily activities.   Patient can safely propel the wheelchair in the home or has a caregiver who can provide assistance.     Follow Up Recommendations Home health PT;Supervision/Assistance - 24 hour    Equipment Recommendations  Wheelchair (measurements PT);Wheelchair cushion (measurements PT)(Lightweight wheelchair)    Recommendations for Other Services       Precautions / Restrictions Precautions Precautions: Fall Restrictions Weight Bearing Restrictions: No      Mobility  Bed Mobility Overal bed mobility: Needs Assistance Bed Mobility: Supine to Sit     Supine to sit: Mod assist;HOB elevated     General bed mobility comments: pt able to move B LE's towards edge of bed but required mod assist for trunk; able to scoot to edge of bed on own but with increased effort  Transfers Overall transfer level: Needs assistance Equipment used: Rolling walker (2 wheeled) Transfers: Sit to/from Stand Sit to Stand: Min guard;+2 safety/equipment;Min assist         General transfer comment: CGA x1 plus min assist of 2nd to stand from bed up to RW  Ambulation/Gait Ambulation/Gait assistance: Min guard;+2 safety/equipment Gait Distance (Feet): 3 Feet(bed to recliner) Assistive device: Rolling walker (2 wheeled)   Gait velocity: decreased   General Gait Details: decreased B step length/foot clearance/heelstrike; no knee buckling noted  Science writer  Modified Rankin (Stroke Patients Only)       Balance Overall balance assessment: Needs assistance Sitting-balance support: No upper extremity supported;Feet supported Sitting balance-Leahy Scale: Good Sitting balance - Comments: steady sitting reaching  within BOS   Standing balance support: Single extremity supported Standing balance-Leahy Scale: Poor Standing balance comment: pt requiring at least single UE spport for static standing balance                             Pertinent Vitals/Pain Pain Assessment: No/denies pain  Vitals (HR and O2 on room air) stable and WFL throughout treatment session.    Home Living Family/patient expects to be discharged to:: Private residence   Available Help at Discharge: Family Type of Home: House Home Access: Stairs to enter Entrance Stairs-Rails: Right Entrance Stairs-Number of Steps: 2 Home Layout: One level Home Equipment: [employer] - 2 wheels;Cane - single point;Shower seat - built in;Bedside commode;Grab bars - tub/shower      Prior Function Level of Independence: Needs assistance   Gait / Transfers Assistance Needed: Unable to walk for past 2 weeks (was able to walk household distances prior to that with RW and someone walking behind pt); recently performing stand pivot transfers to chair with assist           Hand Dominance        Extremity/Trunk Assessment   Upper Extremity Assessment Upper Extremity Assessment: (fair B hand grip strength; at least 3/5 AROM B elbow flexion/extension; pt refused to perform B shoulder flexion and reported h/o L shoulder issues)    Lower Extremity Assessment Lower Extremity Assessment: (3/5 B hip flexion (pt reporting quad pain with minimal resistance to B hip flexion); pt refused to perform B knee flexion/extension assessment and rest of LE assessment deferred d/t pt not appearing to want to participate in assessment anymore))    Cervical / Trunk Assessment Cervical / Trunk Assessment: Normal  Communication   Communication: (pt tending to look away from therapist; wife tending to answer questions)  Cognition Arousal/Alertness: Awake/alert Behavior During Therapy: Flat affect Overall Cognitive Status: Difficult to assess                                         General Comments   Nursing reporting pt wanting to get to chair and present assisting during functional mobility.  Pt agreeable to PT session.  Pt's wife present entire session.    Exercises     Assessment/Plan    PT Assessment Patient needs continued PT services  PT Problem List Decreased strength;Decreased activity tolerance;Decreased balance;Decreased mobility;Decreased knowledge of use of DME       PT Treatment Interventions DME instruction;Gait training;Stair training;Functional mobility training;Therapeutic activities;Therapeutic exercise;Balance training;Patient/family education    PT Goals (Current goals can be found in the Care Plan section)  Acute Rehab PT Goals Patient Stated Goal: to improve strength and mobility PT Goal Formulation: With patient/family Time For Goal Achievement: 07/09/19 Potential to Achieve Goals: Fair    Frequency Min 2X/week   Barriers to discharge        Co-evaluation               AM-PAC PT "6 Clicks" Mobility  Outcome Measure Help needed turning from your back to your side while in a flat bed without using bedrails?: A Little Help needed moving from lying on  your back to sitting on the side of a flat bed without using bedrails?: A Lot Help needed moving to and from a bed to a chair (including a wheelchair)?: A Little Help needed standing up from a chair using your arms (e.g., wheelchair or bedside chair)?: A Little Help needed to walk in hospital room?: A Lot Help needed climbing 3-5 steps with a railing? : Total 6 Click Score: 14    End of Session Equipment Utilized During Treatment: Gait belt Activity Tolerance: Patient tolerated treatment well Patient left: in chair;with call bell/phone within reach;with chair alarm set;with family/visitor present Nurse Communication: Mobility status;Precautions PT Visit Diagnosis: Other abnormalities of gait and mobility (R26.89);Muscle  weakness (generalized) (M62.81);Difficulty in walking, not elsewhere classified (R26.2)    Time: 4010-2725 PT Time Calculation (min) (ACUTE ONLY): 19 min   Charges:   PT Evaluation $PT Eval Low Complexity: 1 Low         Avigail Pilling, PT 06/25/19, 5:05 PM [phone]

## 2019-06-25 NOTE — TOC Initial Note (Signed)
Transition of Care [facility]) - Initial/Assessment Note    Patient Details  Name: [name] Foster MRN: [id number] Date of Birth: 04-Aug-1941  Transition of Care [facility]) CM/SW Contact:    Latanya Maudlin, RN Phone Number: 06/25/2019, 1:01 PM  Clinical Narrative:  [facility] team consulted to assist with disposition. Patient is from home with his spouse. They were previously looking into palliative or hospice care prior to admission. Spoke with spouse and daughter at bedside. Their current preference is for home health with palliative care as they feel they can continue to pursue treatment. Patient is weak at baseline and needs assistance with activities of daily living. They have modified their home to be more handicap accessible. Patient has rolling walker, shower chair and grab bars in the bathroom. [facility] have recently used [facility] and wish to resume. Will also notify [facility] of need for outpatient palliative during the week. Corene Cornea from [facility] is following. PT pending but they voiced that they will refuse SNF. Patient is scheduled for EGD tomorrow and may need PEG tube at some point. TOC team will continue to follow.                Expected Discharge Plan: [place] Barriers to Discharge: Continued Medical Work up   Patient Goals and CMS Choice   CMS Medicare.gov Compare Post Acute Care list provided to:: Patient Represenative (must comment)(spouse) Choice offered to / list presented to : Spouse  Expected Discharge Plan and Services Expected Discharge Plan: [place] In-house Referral: Clinical Social Work Discharge Planning Services: CM Consult Post Acute Care Choice: [place] arrangements for the past 2 months: Single Family Home                           HH Arranged: RN, PT, Nurse's Aide [place] Agency: [place] ([facility]) Date HH Agency Contacted: 06/25/19 Time HH Agency Contacted: 1300 Representative spoke with at [place]  Prior Living Arrangements/Services Living arrangements for the past 2 months: [place] with:: Spouse Patient language and need for interpreter reviewed:: Yes Do you feel safe going back to the place where you live?: Yes      Need for Family Participation in Patient Care: Yes (Comment)(pt refuses to talk to staff)     Criminal Activity/Legal Involvement Pertinent to Current Situation/Hospitalization: No - Comment as needed  Activities of Daily Living Home Assistive Devices/Equipment: Eyeglasses, Environmental consultant (specify type) ADL Screening (condition at time of admission) Patient's cognitive ability adequate to safely complete daily activities?: No Is the patient deaf or have difficulty hearing?: No Does the patient have difficulty seeing, even when wearing glasses/contacts?: No Does the patient have difficulty concentrating, remembering, or making decisions?: No Patient able to express need for assistance with ADLs?: Yes Does the patient have difficulty dressing or bathing?: Yes Independently performs ADLs?: No Communication: Independent Dressing (OT): Dependent Is this a change from baseline?: Pre-admission baseline Grooming: Dependent Is this a change from baseline?: Pre-admission baseline Feeding: Independent Bathing: Dependent Is this a change from baseline?: Pre-admission baseline Toileting: Needs assistance Is this a change from baseline?: Pre-admission baseline In/Out Bed: Dependent Is this a change from baseline?: Pre-admission baseline Walks in Home: Dependent Is this a change from baseline?: Pre-admission baseline Does the patient have difficulty walking or climbing stairs?: Yes Weakness of Legs: Both Weakness of Arms/Hands: None  Permission Sought/Granted Permission sought to share information with :  Case Manager                Emotional Assessment Appearance:: Appears stated age Attitude/Demeanor/Rapport: Guarded, Apprehensive,  Avoidant Affect (typically observed): Irritable Orientation: : Oriented to Self, Oriented to Place, Oriented to Situation, Oriented to  Time      Admission diagnosis:  Dysphagia [R13.10] Dysphagia, unspecified type [R13.10] Patient Active Problem List   Diagnosis Date Noted  . Normocytic anemia 06/24/2019  . Thrombocytosis ([place]) 06/24/2019  . Other dysphagia 06/24/2019  . Dysphagia 06/24/2019  . Palliative care by specialist 06/02/2019  . Iron deficiency anemia 06/02/2019  . Autoimmune neutropenia ([place]) 06/02/2019  . Generalized weakness 06/02/2019  . Panlobular emphysema ([place]) 06/02/2019  . Pneumonia 05/25/2019  . Sepsis ([place])   . Goals of care, counseling/discussion   . DNR (do not resuscitate) discussion   . Atypical pneumonia   . H/O bilateral hip replacements 04/26/2019  . 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 Seropositive rheumatoid arthritis ([place]) 04/21/2019  . Incomplete emptying of bladder 02/03/2017  . 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 Abnormal finding on chest xray 07/18/2013  . Chronic cough 07/18/2013  . Neutropenia ([place]) 09/03/2012  . Tests ordered 09/03/2012  . 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 Benign localized prostatic hyperplasia with lower urinary tract symptoms (LUTS) 07/07/2012  . Elevated prostate specific antigen (PSA) 07/07/2012   PCP:  Cyndie Chime, MD Pharmacy:   [facility], [place] [address] Phone: [phone] Fax: [phone]  [place] [address], [place] - [place] [address] Phone: [phone] Fax: [phone]     Social Determinants of Health (SDOH) Interventions    Readmission Risk Interventions Readmission Risk Prevention Plan 06/25/2019  Transportation Screening Complete  PCP or Specialist Appt within 5-7 Days Complete  Home Care  Screening Complete  Medication Review (RN CM) Complete  Some recent data might be hidden

## 2019-06-25 NOTE — Consult Note (Signed)
[name] Darby, MD [address]  [place]  [place], [place] [address]  Main: [phone]  Fax: [phone] Pager: [phone]   Consultation  Referring Provider:     No ref. provider found Primary Care Physician:  Cyndie Chime, MD Primary Gastroenterologist: none       Reason for Consultation:     Failure to thrive, difficulty swallowing  Date of Admission:  06/24/2019 Date of Consultation:  06/25/2019         HPI:   [name] Foster is a 78 y.o. male with history of rheumatoid arthritis, hip replacement admitted with failure to thrive, significant weight loss as well as unable to swallow solids for the last 2 weeks. Patient provided some history but most of the history was obtained from his daughter over phone.  I had a lengthy conversation with her. Patient underwent hip replacement on June 6th, completely recovered from surgery, underwent PT, was walking with cane, eating well, weight stable.  Around the same time, he was also diagnosed with seropositive rheumatoid arthritis, was seen by the rheumatologist at [facility] and was staretd on MTX 10 mg weekly on July 1st.  He received short course of prednisone as well.  He took only 2 doses, within in 2weeks, he was not tolerating the medication, reported severe nausea, lack of appetite, bumps in his throat that has resulted in weight loss of 30lbs.  In those 2 weeks, he was also admitted to [facility] secondary to pneumonia resulting in sepsis.  And his health condition further declined to get poor p.o. intake.  For the last 1 week or so, patient refused to swallow pills and also not eating any solid food.  He is only drinking liquids, that has led to further weight loss.  In the meantime, patient was evaluated by oncologist for thrombocytosis and anemia, underwent bone marrow biopsy which did not reveal any evidence of malignancy.  His iron studies are consistent with anemia of chronic disease.  Patient had a video visit with his  oncologist, Dr. Tasia Catchings yesterday and he was recommended to go to ER for feeding tube placement due to failure to thrive and unable to eat anything He does not smoke or drink alcohol   NSAIDs: None  Antiplts/Anticoagulants/Anti thrombotics: None  GI Procedures: None  Past Medical History:  Diagnosis Date  . Arthritis   . BPH (benign prostatic hyperplasia)   . Former smoker, stopped smoking many years ago   . History of needle biopsy of prostate with negative result 2013  . Hypertension   . Prostate enlargement   . Prostatic intraepithelial neoplasm III 2013    Past Surgical History:  Procedure Laterality Date  . HEMORROIDECTOMY    . HERNIA REPAIR    . JOINT REPLACEMENT Left    THR  . PROSTATE BIOPSY  2013  . TOTAL HIP ARTHROPLASTY Left   . TOTAL HIP ARTHROPLASTY Right 04/26/2019   Procedure: TOTAL HIP ARTHROPLASTY ANTERIOR APPROACH;  Surgeon: Hessie Knows, MD;  Location: [facility] ORS;  Service: Orthopedics;  Laterality: Right;    Prior to Admission medications   Medication Sig Start Date End Date Taking? Authorizing Provider  albuterol (VENTOLIN HFA) 108 (90 Base) MCG/ACT inhaler Inhale 2 puffs into the lungs every 6 (six) hours as needed. 05/23/19  Yes [provider]  doxazosin (CARDURA) 4 MG tablet Take 1 tablet (4 mg total) by mouth daily. 08/09/18  Yes Stoioff, Ronda Fairly, MD  finasteride (PROSCAR) 5 MG tablet Take 1  tablet (5 mg total) by mouth daily. 08/09/18  Yes Stoioff, Ronda Fairly, MD  Fluticasone-Salmeterol (ADVAIR) 250-50 MCG/DOSE AEPB Inhale 1 puff into the lungs 2 (two) times a day. 06/02/19 06/01/20 Yes [provider]   Current Facility-Administered Medications:  .  0.9 %  sodium chloride infusion, , Intravenous, Continuous, Sudini, Srikar, MD, Last Rate: 100 mL/hr at 06/25/19 0815 .  acetaminophen (TYLENOL) tablet 650 mg, 650 mg, Oral, Q6H PRN **OR** acetaminophen (TYLENOL) suppository 650 mg, 650 mg, Rectal, Q6H PRN, Sudini, Srikar, MD .  albuterol  (PROVENTIL) (2.5 MG/3ML) 0.083% nebulizer solution 2.5 mg, 2.5 mg, Inhalation, Q6H PRN, Sudini, Srikar, MD .  doxazosin (CARDURA) tablet 4 mg, 4 mg, Oral, Daily, Sudini, Srikar, MD, 4 mg at 06/25/19 1129 .  feeding supplement (ENSURE ENLIVE) (ENSURE ENLIVE) liquid 237 mL, 237 mL, Oral, BID BM, Sudini, Srikar, MD, 237 mL at 06/25/19 1140 .  finasteride (PROSCAR) tablet 5 mg, 5 mg, Oral, Daily, Sudini, Srikar, MD, 5 mg at 06/25/19 1128 .  mometasone-formoterol (DULERA) 200-5 MCG/ACT inhaler 2 puff, 2 puff, Inhalation, BID, Sudini, Srikar, MD .  OLANZapine (ZYPREXA) tablet 2.5 mg, 2.5 mg, Oral, QHS, Jesicca Dipierro, Tally Due, MD .  ondansetron (ZOFRAN) tablet 4 mg, 4 mg, Oral, Q6H PRN **OR** ondansetron (ZOFRAN) injection 4 mg, 4 mg, Intravenous, Q6H PRN, Sudini, Srikar, MD .  pantoprazole (PROTONIX) EC tablet 40 mg, 40 mg, Oral, Daily, Sudini, Srikar, MD, 40 mg at 06/25/19 1129 .  polyethylene glycol (MIRALAX / GLYCOLAX) packet 17 g, 17 g, Oral, Daily PRN, Hillary Bow, MD   Family History  Problem Relation Age of Onset  . Prostate cancer Father   . Cancer Father   . Prostate cancer Brother   . Cancer Mother      Social History   Tobacco Use  . Smoking status: Former Smoker    Quit date: 06/04/1980    Years since quitting: 39.0  . Smokeless tobacco: Never Used  Substance Use Topics  . Alcohol use: No  . Drug use: No    Allergies as of 06/24/2019 - Review Complete 06/24/2019  Allergen Reaction Noted  . Methotrexate  06/02/2019    Review of Systems:    All systems reviewed and negative except where noted in HPI.   Physical Exam:  Vital signs in last 24 hours: Temp:  [97.9 F (36.6 C)-98.4 F (36.9 C)] 98.1 F (36.7 C) (08/15 1206) Pulse Rate:  [94-115] 97 (08/15 1206) Resp:  [15-30] 15 (08/15 1206) BP: (94-121)/(47-77) 94/56 (08/15 1206) SpO2:  [97 %-100 %] 100 % (08/15 1206) Weight:  [58.5 kg] 58.5 kg (08/15 0536) Last BM Date: 06/19/19 General:   Dull appearing,  cooperative, cachectic in NAD Head:  Normocephalic and atraumatic, bitemporal wasting. Eyes:   No icterus.   Conjunctiva pink. PERRLA. Ears:  Normal auditory acuity. Neck:  Supple; no masses or thyroidomegaly Lungs: Respirations even and unlabored. Lungs clear to auscultation bilaterally.   No wheezes, crackles, or rhonchi.  Heart:  Regular rate and rhythm;  Without murmur, clicks, rubs or gallops Abdomen:  Soft, nondistended, nontender. Normal bowel sounds. No appreciable masses or hepatomegaly.  No rebound or guarding.  Rectal:  Not performed. Msk:  Symmetrical without gross deformities.  Extremities:  Without edema, cyanosis or clubbing. Neurologic:  Alert and oriented x3;  grossly normal neurologically. Skin:  Intact without significant lesions or rashes. Psych:  Alert and cooperative. Normal affect.  LAB RESULTS: CBC Latest Ref Rng & Units 06/25/2019 06/24/2019 06/16/2019  WBC 4.0 -  10.5 K/uL 9.3 9.9 14.6(H)  Hemoglobin 13.0 - 17.0 g/dL 7.5(L) 9.3(L) 9.2(L)  Hematocrit 39.0 - 52.0 % 25.2(L) 30.9(L) 29.9(L)  Platelets 150 - 400 K/uL 617(H) 744(H) 686(H)    BMET BMP Latest Ref Rng & Units 06/25/2019 06/24/2019 05/27/2019  Glucose 70 - 99 mg/dL 101(H) 122(H) 142(H)  BUN 8 - 23 mg/dL 12 14 6(L)  Creatinine 0.61 - 1.24 mg/dL 0.48(L) 0.54(L) 0.56(L)  Sodium 135 - 145 mmol/L 136 134(L) 130(L)  Potassium 3.5 - 5.1 mmol/L 3.2(L) 4.1 3.5  Chloride 98 - 111 mmol/L 99 93(L) 97(L)  CO2 22 - 32 mmol/L _0 Calcium 8.9 - 10.3 mg/dL 7.8(L) 8.4(L) 7.5(L)    LFT Hepatic Function Latest Ref Rng & Units 06/24/2019 05/24/2019 07/29/2018  Total Protein 6.5 - 8.1 g/dL 7.2 6.7 8.1  Albumin 3.5 - 5.0 g/dL 1.7(L) 2.1(L) 4.2  AST 15 - 41 U/L _1 ALT 0 - 44 U/L _2 Alk Phosphatase 38 - 126 U/L 126 258(H) 100  Total Bilirubin 0.3 - 1.2 mg/dL 1.4(H) 0.8 0.6  Bilirubin, Direct 0.0 - 0.2 mg/dL 0.5(H) - -     STUDIES: Dg Chest Portable 1 View  Result Date: 06/24/2019 CLINICAL DATA:   Feeding tube placement.  Inability to swallow. EXAM: PORTABLE CHEST 1 VIEW COMPARISON:  May 25, 2019 FINDINGS: No feeding tube identified. The heart, hila, mediastinum, lungs, and pleura are unchanged with no acute abnormalities. IMPRESSION: No active disease. Electronically Signed   By: Dorise Bullion III M.D   On: 06/24/2019 15:36      Impression / Plan:   [name] Foster is a 78 y.o. male with recent diagnosis of seropositive rheumatoid arthritis, briefly on methotrexate, recent hip replacement with acute onset of failure to thrive within last 1 month as well as acute onset of difficulty swallowing to solids and pills.  Family is requesting feeding tube placement.  I had a lengthy discussion with patient, his wife and his daughter about possibility of feeding tube placement, risks and benefits of it after performing upper endoscopy to rule out any structural lesions in the esophagus and in his stomach.  He can have a Dobbhoff tube temporarily and see if he tolerates tube feeds well before placing a permanent gastrostomy tube Will perform EGD tomorrow N.p.o. past midnight Okay with clear liquid diet and protein supplements  Thank you for involving me in the care of this patient.  Will follow along with you    LOS: 1 day   Sherri Sear, MD  06/25/2019, 1:07 PM   Note: This dictation was prepared with Dragon dictation along with smaller phrase technology. Any transcriptional errors that result from this process are unintentional.

## 2019-06-26 ENCOUNTER — Encounter: Payer: Self-pay | Admitting: *Deleted

## 2019-06-26 ENCOUNTER — Encounter
Admission: EM | Disposition: A | Discharge status: Home Health | Payer: Self-pay | Source: Home / Self Care | Attending: Internal Medicine

## 2019-06-26 ENCOUNTER — Inpatient Hospital Stay: Payer: Medicare HMO | Admitting: Anesthesiology

## 2019-06-26 DIAGNOSIS — R634 Abnormal weight loss: Secondary | ICD-10-CM

## 2019-06-26 DIAGNOSIS — K3189 Other diseases of stomach and duodenum: Secondary | ICD-10-CM

## 2019-06-26 DIAGNOSIS — E43 Unspecified severe protein-calorie malnutrition: Secondary | ICD-10-CM | POA: Insufficient documentation

## 2019-06-26 HISTORY — PX: ESOPHAGOGASTRODUODENOSCOPY: SHX5428

## 2019-06-26 LAB — BASIC METABOLIC PANEL
Anion gap: 5 (ref 5–15)
BUN: 7 mg/dL — ABNORMAL LOW (ref 8–23)
CO2: 27 mmol/L (ref 22–32)
Calcium: 7.7 mg/dL — ABNORMAL LOW (ref 8.9–10.3)
Chloride: 105 mmol/L (ref 98–111)
Creatinine, Ser: <0.3 mg/dL — ABNORMAL LOW (ref 0.61–1.24)
Glucose, Bld: 110 mg/dL — ABNORMAL HIGH (ref 70–99)
Potassium: 3.1 mmol/L — ABNORMAL LOW (ref 3.5–5.1)
Sodium: 137 mmol/L (ref 135–145)

## 2019-06-26 LAB — CBC
HCT: 24.5 % — ABNORMAL LOW (ref 39.0–52.0)
Hemoglobin: 7.4 g/dL — ABNORMAL LOW (ref 13.0–17.0)
MCH: 24.3 pg — ABNORMAL LOW (ref 26.0–34.0)
MCHC: 30.2 g/dL (ref 30.0–36.0)
MCV: 80.3 fL (ref 80.0–100.0)
Platelets: 532 10*3/uL — ABNORMAL HIGH (ref 150–400)
RBC: 3.05 MIL/uL — ABNORMAL LOW (ref 4.22–5.81)
RDW: 17 % — ABNORMAL HIGH (ref 11.5–15.5)
WBC: 8 10*3/uL (ref 4.0–10.5)
nRBC: 0 % (ref 0.0–0.2)

## 2019-06-26 LAB — PHOSPHORUS: Phosphorus: 3 mg/dL (ref 2.5–4.6)

## 2019-06-26 LAB — GLUCOSE, CAPILLARY: Glucose-Capillary: 115 mg/dL — ABNORMAL HIGH (ref 70–99)

## 2019-06-26 LAB — PREALBUMIN: Prealbumin: <5 mg/dL — ABNORMAL LOW (ref 18–38)

## 2019-06-26 LAB — POTASSIUM: Potassium: 3.6 mmol/L (ref 3.5–5.1)

## 2019-06-26 LAB — MAGNESIUM: Magnesium: 1.8 mg/dL (ref 1.7–2.4)

## 2019-06-26 SURGERY — EGD (ESOPHAGOGASTRODUODENOSCOPY)
Anesthesia: General

## 2019-06-26 MED ORDER — POTASSIUM CHLORIDE 10 MEQ/50ML IV SOLN: 10.0000 meq | INTRAVENOUS | Status: DC

## 2019-06-26 MED ORDER — PRO-STAT SUGAR FREE PO LIQD
30.0000 mL | Freq: Two times a day (BID) | ORAL | Status: DC
  Administered 2019-06-26 – 2019-06-28 (×2): 30 mL

## 2019-06-26 MED ORDER — PROPOFOL 500 MG/50ML IV EMUL
INTRAVENOUS | Status: DC | PRN
  Administered 2019-06-26: 175 ug/kg/min via INTRAVENOUS

## 2019-06-26 MED ORDER — SODIUM CHLORIDE 0.9 % IV SOLN
INTRAVENOUS | Status: DC | PRN
  Administered 2019-06-26: 250 mL via INTRAVENOUS

## 2019-06-26 MED ORDER — OSMOLITE 1.5 CAL PO LIQD
1000.0000 mL | ORAL | Status: DC
  Administered 2019-06-26 – 2019-06-28 (×2): 1000 mL

## 2019-06-26 MED ORDER — BOOST / RESOURCE BREEZE PO LIQD CUSTOM
1.0000 | Freq: Three times a day (TID) | ORAL | Status: DC
  Administered 2019-06-27: 1 via ORAL

## 2019-06-26 MED ORDER — POTASSIUM CHLORIDE 10 MEQ/100ML IV SOLN
10.0000 meq | INTRAVENOUS | Status: AC
  Administered 2019-06-26 (×3): 10 meq via INTRAVENOUS
  Filled 2019-06-26: qty 100

## 2019-06-26 MED ORDER — LIDOCAINE HCL (CARDIAC) PF 100 MG/5ML IV SOSY
PREFILLED_SYRINGE | INTRAVENOUS | Status: DC | PRN
  Administered 2019-06-26: 50 mg via INTRAVENOUS

## 2019-06-26 MED ORDER — JEVITY 1.2 CAL PO LIQD: 1000.0000 mL | ORAL | Status: DC

## 2019-06-26 MED ORDER — FREE WATER
115.0000 mL | Status: DC
  Administered 2019-06-26 – 2019-06-29 (×17): 115 mL

## 2019-06-26 MED ORDER — PROPOFOL 10 MG/ML IV BOLUS
INTRAVENOUS | Status: DC | PRN
  Administered 2019-06-26: 40 mg via INTRAVENOUS

## 2019-06-26 MED ORDER — PROPOFOL 10 MG/ML IV BOLUS
INTRAVENOUS | Status: AC
  Filled 2019-06-26: qty 20

## 2019-06-26 NOTE — Progress Notes (Signed)
[place] [facility] [place] NAME: [name] Foster    MR#:  [id number]  DATE OF BIRTH:  07/12/1941  SUBJECTIVE:  CHIEF COMPLAINT:   Chief Complaint  Patient presents with  . Feeding tube placement   No new complaint this morning.  Patient still reported having difficulty swallowing solids.   Patient already seen by gastroenterologist and scheduled for EGD today.  REVIEW OF SYSTEMS:  ROS Constitutional: Negative for chills and fever.  HENT: Negative for sore throat.   Eyes: Negative for blurred vision, double vision and pain.  Respiratory: Negative for cough, hemoptysis, shortness of breath and wheezing.   Cardiovascular: Negative for chest pain, palpitations, orthopnea and leg swelling.  Gastrointestinal: Positive for difficulty swallowing.  Negative for abdominal pain, constipation, diarrhea, heartburn, nausea and vomiting.  Genitourinary: Negative for dysuria and hematuria.  Musculoskeletal: Negative for back pain and joint pain.  Skin: Negative for rash.  Neurological: Negative for sensory change, speech change, focal weakness and headaches.  Endo/Heme/Allergies: Does not bruise/bleed easily.  Psychiatric/Behavioral: Negative for depression. The patient is not nervous  DRUG ALLERGIES:   Allergies  Allergen Reactions  . Methotrexate     Other reaction(s): Other (See Comments) Weakness,    VITALS:  Blood pressure 116/61, pulse 98, temperature 99.1 F (37.3 C), temperature source Oral, resp. rate 18, height 5\' 8"  (1.727 m), weight 58.5 kg, SpO2 100 %. PHYSICAL EXAMINATION:  Physical Exam   GENERAL:  78 y.o.-year-old patient lying in the bed with no acute distress.  EYES: Pupils equal, round, reactive to light and accommodation. No scleral icterus. Extraocular muscles intact.  HEENT: Head atraumatic, normocephalic. Oropharynx and nasopharynx clear. No oropharyngeal erythema, dry oral mucosa  NECK:  Supple, no jugular venous  distention. No thyroid enlargement, no tenderness.  LUNGS: Normal breath sounds bilaterally, no wheezing, rales, rhonchi. No use of accessory muscles of respiration.  CARDIOVASCULAR: S1, S2 normal. No murmurs, rubs, or gallops.  ABDOMEN: Soft, nontender, nondistended. Bowel sounds present. No organomegaly or mass.  EXTREMITIES: No pedal edema, cyanosis, or clubbing. + 2 pedal & radial pulses b/l.   NEUROLOGIC: Cranial nerves II through XII are intact. No focal Motor or sensory deficits appreciated b/l PSYCHIATRIC: The patient is alert and oriented x 3.   Moving all extremities with no focal deficit SKIN: No obvious rash, lesion, or ulcer.   LABORATORY PANEL:  Male CBC Recent Labs  Lab 06/26/19 0531  WBC 8.0  HGB 7.4*  HCT 24.5*  PLT 532*   ------------------------------------------------------------------------------------------------------------------ Chemistries  Recent Labs  Lab 06/24/19 1400  06/26/19 0531  NA 134*   < > 137  K 4.1   < > 3.1*  CL 93*   < > 105  CO2 27   < > 27  GLUCOSE 122*   < > 110*  BUN 14   < > 7*  CREATININE 0.54*   < > <0.30*  CALCIUM 8.4*   < > 7.7*  MG  --   --  1.8  AST 25  --   --   ALT 13  --   --   ALKPHOS 126  --   --   BILITOT 1.4*  --   --    < > = values in this interval not displayed.   RADIOLOGY:  No results found. ASSESSMENT AND PLAN:   *Dysphagia to solids.   Tolerating thin liquids.  Etiology unclear.  Patient had CT of the chest and abdomen and pelvis with  contrast done on admission with no acute findings compared to prior CT scan.  Noted asymmetric reticulation in the right lung as described previously.  Referred to follow-up recommendations at that time.  Nonemergent findings on the CT abdomen. Patient seen by gastroenterologist.  Scheduled for EGD today.  We will follow-up on findings of EGD and make a decision if placement of Dobbhoff tube is recommended for additional nutrition.  Patient appears to do okay with clear  liquids.  *Dehydration due to decreased oral intake.    Continue IV fluids.  *Interstitial lung disease/emphysema.  Not on oxygen.  No significant shortness of breath.  Nebulizers as needed  *Seropositive rheumatoid arthritis.  Not on treatment at this time.  *Severe protein calorie malnutrition.  Protein supplement ordered.  *Anemia Likely due to anemia of chronic disease. Noted drop in hemoglobin to 7.4 this morning. Iron studies done with iron saturation of 12% suggestive of iron deficiency anemia.  Will place on supplemental iron after endoscopic evaluation.  DVT prophylaxis ; Lovenox discontinued previously due to drop in hemoglobin. Ordered SCDs   All the records are reviewed and case discussed with Care Management/Social Worker. Management plans discussed with the patient, family and they are in agreement. I Was Notified by Case Manager primary care physician and patient will begin discussion about setting up home health with palliative care on discharge from the hospital.  CODE STATUS: DNR  TOTAL TIME TAKING CARE OF THIS PATIENT: 34 minutes.   More than 50% of the time was spent in counseling/coordination of care: YES  POSSIBLE D/C IN 2 DAYS, DEPENDING ON CLINICAL CONDITION.   Correy Weidner M.D on 06/26/2019 at 10:24 AM  Between 7am to 6pm - Pager - [phone]  After 6pm go to [email or web address] - Proofreader  [facility]  Office  [phone]  CC: Primary care physician; Cyndie Chime, MD  Note: This dictation was prepared with Dragon dictation along with smaller phrase technology. Any transcriptional errors that result from this process are unintentional.

## 2019-06-26 NOTE — Op Note (Signed)
[facility] Gastroenterology Patient Name: [name] Foster Procedure Date: 06/26/2019 11:37 AM MRN: [id number] Account #: [id number] Date of Birth: 1941/07/05 Admit Type: Inpatient Age: 78 Room: [facility] ENDO ROOM 4 Gender: Male Note Status: Finalized Procedure:            Upper GI endoscopy Indications:          Dysphagia, Weight loss Providers:            Lin Landsman MD, MD Referring MD:         Sarajane Jews Medicines:            Monitored Anesthesia Care Complications:        No immediate complications. Estimated blood loss: None. Procedure:            Pre-Anesthesia Assessment:                       - Prior to the procedure, a History and Physical was                        performed, and patient medications and allergies were                        reviewed. The patient is competent. The risks and                        benefits of the procedure and the sedation options and                        risks were discussed with the patient. All questions                        were answered and informed consent was obtained.                        Patient identification and proposed procedure were                        verified by the physician, the nurse, the                        anesthesiologist, the anesthetist and the technician in                        the pre-procedure area in the procedure room in the                        endoscopy suite. Mental Status Examination: alert and                        oriented. Airway Examination: normal oropharyngeal                        airway and neck mobility. Respiratory Examination:                        clear to auscultation. CV Examination: normal.                        Prophylactic Antibiotics: The patient does not require  prophylactic antibiotics. Prior Anticoagulants: The                        patient has taken no previous anticoagulant or                        antiplatelet  agents. ASA Grade Assessment: III - A                        patient with severe systemic disease. After reviewing                        the risks and benefits, the patient was deemed in                        satisfactory condition to undergo the procedure. The                        anesthesia plan was to use monitored anesthesia care                        (MAC). Immediately prior to administration of                        medications, the patient was re-assessed for adequacy                        to receive sedatives. The heart rate, respiratory rate,                        oxygen saturations, blood pressure, adequacy of                        pulmonary ventilation, and response to care were                        monitored throughout the procedure. The physical status                        of the patient was re-assessed after the procedure.                       After obtaining informed consent, the endoscope was                        passed under direct vision. Throughout the procedure,                        the patient's blood pressure, pulse, and oxygen                        saturations were monitored continuously. The Endoscope                        was introduced through the mouth, and advanced to the                        second part of duodenum. The upper GI endoscopy was  accomplished without difficulty. The patient tolerated                        the procedure well. Findings:      The duodenal bulb and second portion of the duodenum were normal.       Biopsies were taken with a cold forceps for histology.      Diffuse mild atrophic mucosa was found in the entire examined stomach.       Biopsies were taken with a cold forceps for histology.      The cardia and gastric fundus were normal on retroflexion.      The gastroesophageal junction and examined esophagus were normal. A 10       Fr nasogastric tube was placed through the nares into the  esophagus.       Under endoscopic guidance, the tube was advanced into the duodenum.       Placement was confirmed by scope visualization. Impression:           - Normal duodenal bulb and second portion of the                        duodenum. Biopsied.                       - Mild gastric mucosal atrophy. Biopsied.                       - Normal gastroesophageal junction and esophagus.                       - Feeding tube placement was successfully performed. Recommendation:       - Return patient to hospital ward for ongoing care.                       - Full liquid diet.                       - Continue present medications.                       - Consult dietician for tube feeds                       - Await pathology results.                       - Will attempt PEG if patient tolerating tube feeds for                        next 24-48years Procedure Code(s):    --- Professional ---                       [phone], Esophagogastroduodenoscopy, flexible, transoral;                        with insertion of intraluminal tube or catheter                       43239, Esophagogastroduodenoscopy, flexible, transoral;                        with biopsy, single or multiple Diagnosis Code(s):    ---  Professional ---                       K31.89, Other diseases of stomach and duodenum                       R13.10, Dysphagia, unspecified                       R63.4, Abnormal weight loss CPT copyright 2019 American Medical Association. All rights reserved. The codes documented in this report are preliminary and upon coder review may  be revised to meet current compliance requirements. Dr. Ulyess Mort Lin Landsman MD, MD 06/26/2019 1:01:27 PM This report has been signed electronically. Number of Addenda: 0 Note Initiated On: 06/26/2019 11:37 AM Estimated Blood Loss: Estimated blood loss: none.      [facility]

## 2019-06-26 NOTE — Anesthesia Preprocedure Evaluation (Addendum)
Anesthesia Evaluation  Patient identified by MRN, date of birth, ID band Patient awake    Reviewed: Allergy & Precautions, H&P , NPO status , reviewed documented beta blocker date and time   Airway Mallampati: II  TM Distance: >3 FB Neck ROM: limited    Dental  (+) Chipped, Poor Dentition   Pulmonary pneumonia, resolved, COPD, former smoker,    Pulmonary exam normal        Cardiovascular hypertension, Normal cardiovascular exam     Neuro/Psych    GI/Hepatic   Endo/Other    Renal/GU      Musculoskeletal  (+) Arthritis ,   Abdominal   Peds  Hematology  (+) Blood dyscrasia, anemia ,   Anesthesia Other Findings Past Medical History: No date: Arthritis No date: BPH (benign prostatic hyperplasia) No date: Former smoker, stopped smoking many years ago 2013: History of needle biopsy of prostate with negative result No date: Hypertension No date: 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  Past Surgical History: No date: HEMORROIDECTOMY No date: HERNIA REPAIR No date: JOINT REPLACEMENT; Left     Comment:  THR 2013: PROSTATE BIOPSY No date: TOTAL HIP ARTHROPLASTY; Left 04/26/2019: TOTAL HIP ARTHROPLASTY; Right     Comment:  Procedure: TOTAL HIP ARTHROPLASTY ANTERIOR APPROACH;                Surgeon: Hessie Knows, MD;  Location: [facility] ORS;                Service: Orthopedics;  Laterality: Right;  BMI    Body Mass Index: 19.61 kg/m      Reproductive/Obstetrics                            Anesthesia Physical Anesthesia Plan  ASA: III and emergent  Anesthesia Plan: General   Post-op Pain Management:    Induction: Intravenous  PONV Risk Score and Plan: 2 and Treatment may vary due to age or medical condition and TIVA  Airway Management Planned: Nasal Cannula and Natural Airway  Additional Equipment:   Intra-op Plan:   Post-operative Plan:   Informed  Consent: I have reviewed the patients History and Physical, chart, labs and discussed the procedure including the risks, benefits and alternatives for the proposed anesthesia with the patient or authorized representative who has indicated his/her understanding and acceptance.     Dental Advisory Given  Plan Discussed with: CRNA  Anesthesia Plan Comments:        Anesthesia Quick Evaluation

## 2019-06-26 NOTE — Progress Notes (Signed)
Initial Nutrition Assessment  DOCUMENTATION CODES:   Severe malnutrition in context of chronic illness  INTERVENTION:  Initiate Osmolite 1.5 Cal at 15 mL/hr and advance by 15 mL/hr every 8 hours to goal rate of 45 mL/hr. Also provide Pro-Stat 30 mL BID per tube. Goal regimen provides 1820 kcal, 98 grams of protein, 821 mL H2O daily.  Provide free water flush of 115 mL Q4hrs. Provides 1511 mL H2O daily including water in tube feed regimen.  Goal TF regimen meets 100% RDIs for vitamins/minerals.  Monitor magnesium, potassium, and phosphorus daily for at least 3 days, MD to replete as needed, as pt is at risk for refeeding syndrome given severe malnutrition.  Will discontinue Ensure Enlive as wife reports it made patient nauseas. Provide Boost Breeze po TID, each supplement provides 250 kcal and 9 grams of protein. Can titrate tube feeding goal regimen based on patient's PO intake.  NUTRITION DIAGNOSIS:   Severe Malnutrition related to chronic illness(dysphagia, inadequate oral intake) as evidenced by energy intake < or equal to 75% for > or equal to 1 month, severe fat depletion, moderate-severe muscle depletion, 28.3% weight loss over the past 11 months (most of that weight loss occurring in the past 3 months).  GOAL:   Patient will meet greater than or equal to 90% of their needs  MONITOR:   PO intake, Labs, Weight trends, TF tolerance, I & O's  REASON FOR ASSESSMENT:   Malnutrition Screening Tool, Consult Enteral/tube feeding initiation and management, Poor PO  ASSESSMENT:   78 year old male with PMHx of BPH, arthritis, HTN, interstitial lung disease/emphysema, seropositive rheumatoid arthritis admitted with dysphagia to solids, dehydration.   Met with patient and his wife this morning at bedside prior to patient being taken for EGD. Most of history provided by wife as patient was resting with his eyes closed. Wife reports he has had dysphagia with solids since July. She  reports it was around the same time he started on methotrexate. Prior to admission he was only taking in water and sips of coffee. He tried the [employer] yesterday and it made him nauseas. She is requesting it be discontinued. Patient now with post-pyloric Dobbhoff tube in place and plan is to start enteral nutrition. If patient tolerates for 24-48 hours plan is for placement of PEG tube per GI note. EGD findings from today included normal duodenal bulb and second portion of duodenum (biopsied), mild gastric mucosal atrophy (biopsied), normal GE junction and esophagus.  Wife reports patient's UBW was 185-190 lbs. Per chart he was 81.6 kg on 08/06/2018. He is now 58.5 kg (128.97 lbs). He has lost 23.1 kg (28.3% body weight) over the past 11 months, which is significant for time frame. Most of that weight loss occurred in the past 3 months per wife's report. However, due to degree of wasting found on NFPE malnutrition appears to be more of a chronic nature.  Enteral Access: 10 Fr. Dobbhoff tube placed by Dr. Marius Ditch today; terminates in duodenum  Medications reviewed and include: pantoprazole.  Labs reviewed: Potassium 3.1, BUN 7, Creatinine <0.3.  NUTRITION - FOCUSED PHYSICAL EXAM:    Most Recent Value  Orbital Region  Severe depletion  Upper Arm Region  Severe depletion  Thoracic and Lumbar Region  Moderate depletion  Buccal Region  Severe depletion  Temple Region  Severe depletion  Clavicle Bone Region  Severe depletion  Clavicle and Acromion Bone Region  Severe depletion  Scapular Bone Region  Unable to assess  Dorsal  Hand  Severe depletion  Patellar Region  Moderate depletion  Anterior Thigh Region  Moderate depletion  Posterior Calf Region  Moderate depletion  Edema (RD Assessment)  None  Hair  Reviewed  Eyes  Unable to assess  Mouth  Unable to assess  Skin  Reviewed  Nails  Reviewed     Diet Order:   Diet Order            Diet full liquid Room service appropriate? Yes; Fluid  consistency: Thin  Diet effective now             EDUCATION NEEDS:   No education needs have been identified at this time  Skin:  Skin Assessment: Reviewed RN Assessment  Last BM:  06/19/2019 per chart  Height:   Ht Readings from Last 1 Encounters:  06/24/19 5' 8"  (1.727 m)   Weight:   Wt Readings from Last 1 Encounters:  06/25/19 58.5 kg   Ideal Body Weight:  70 kg  BMI:  Body mass index is 19.61 kg/m.  Estimated Nutritional Needs:   Kcal:  1545-1800 (MSJ x 1.2-1.4)  Protein:  85-100 grams (1.5-1.7 grams/kg)  Fluid:  1.5-1.8 L/day  Willey Blade, MS, RD, LDN Office: [phone] Pager: [phone] After Hours/Weekend Pager: [phone]

## 2019-06-26 NOTE — Transfer of Care (Signed)
Immediate Anesthesia Transfer of Care Note  Patient: [name] Foster  Procedure(s) Performed: ESOPHAGOGASTRODUODENOSCOPY (EGD) (N/A )  Patient Location: PACU  Anesthesia Type:General  Level of Consciousness: awake  Airway & Oxygen Therapy: Patient Spontanous Breathing and Patient connected to nasal cannula oxygen  Post-op Assessment: Report given to RN and Post -op Vital signs reviewed and stable  Post vital signs: Reviewed and stable  Last Vitals:  Vitals Value Taken Time  BP    Temp    Pulse    Resp    SpO2      Last Pain:  Vitals:   06/26/19 1229  TempSrc: Oral  PainSc:          Complications: No apparent anesthesia complications

## 2019-06-26 NOTE — Anesthesia Post-op Follow-up Note (Signed)
Anesthesia QCDR form completed.        

## 2019-06-26 NOTE — Brief Op Note (Signed)
32F dobhoff placed right nare into duodenum.Taped securely at 85cm at nare. Correct position confirmed under direct view with egd scope. X-ray not required, per Dr. Marius Ditch.

## 2019-06-27 ENCOUNTER — Encounter: Payer: Self-pay | Admitting: Gastroenterology

## 2019-06-27 ENCOUNTER — Inpatient Hospital Stay: Payer: Medicare HMO

## 2019-06-27 DIAGNOSIS — E43 Unspecified severe protein-calorie malnutrition: Secondary | ICD-10-CM

## 2019-06-27 DIAGNOSIS — Z515 Encounter for palliative care: Secondary | ICD-10-CM

## 2019-06-27 LAB — GLUCOSE, CAPILLARY
Glucose-Capillary: 133 mg/dL — ABNORMAL HIGH (ref 70–99)
Glucose-Capillary: 143 mg/dL — ABNORMAL HIGH (ref 70–99)
Glucose-Capillary: 153 mg/dL — ABNORMAL HIGH (ref 70–99)
Glucose-Capillary: 159 mg/dL — ABNORMAL HIGH (ref 70–99)
Glucose-Capillary: 173 mg/dL — ABNORMAL HIGH (ref 70–99)
Glucose-Capillary: 188 mg/dL — ABNORMAL HIGH (ref 70–99)

## 2019-06-27 LAB — BASIC METABOLIC PANEL
Anion gap: 14 (ref 5–15)
BUN: 7 mg/dL — ABNORMAL LOW (ref 8–23)
CO2: 29 mmol/L (ref 22–32)
Calcium: 7.6 mg/dL — ABNORMAL LOW (ref 8.9–10.3)
Chloride: 91 mmol/L — ABNORMAL LOW (ref 98–111)
Creatinine, Ser: 0.32 mg/dL — ABNORMAL LOW (ref 0.61–1.24)
GFR calc Af Amer: >60 mL/min (ref >60)
GFR calc non Af Amer: >60 mL/min (ref >60)
Glucose, Bld: 166 mg/dL — ABNORMAL HIGH (ref 70–99)
Potassium: 3.1 mmol/L — ABNORMAL LOW (ref 3.5–5.1)
Sodium: 134 mmol/L — ABNORMAL LOW (ref 135–145)

## 2019-06-27 LAB — MAGNESIUM: Magnesium: 1.9 mg/dL (ref 1.7–2.4)

## 2019-06-27 MED ORDER — IOHEXOL 240 MG/ML SOLN: 25.0000 mL | INTRAMUSCULAR | Status: AC

## 2019-06-27 MED ORDER — FERROUS SULFATE 220 (44 FE) MG/5ML PO ELIX
220.0000 mg | ORAL_SOLUTION | Freq: Two times a day (BID) | ORAL | Status: DC
  Administered 2019-06-27 – 2019-06-28 (×2): 220 mg
  Filled 2019-06-27 (×5): qty 5

## 2019-06-27 MED ORDER — ORAL CARE MOUTH RINSE
15.0000 mL | Freq: Two times a day (BID) | OROMUCOSAL | Status: DC
  Administered 2019-06-28 – 2019-06-29 (×2): 15 mL via OROMUCOSAL

## 2019-06-27 MED ORDER — SODIUM CHLORIDE 0.9 % IV SOLN
INTRAVENOUS | Status: DC
  Administered 2019-06-27 – 2019-06-29 (×4): via INTRAVENOUS

## 2019-06-27 MED ORDER — DOCUSATE SODIUM 50 MG/5ML PO LIQD
100.0000 mg | Freq: Two times a day (BID) | ORAL | Status: DC
  Administered 2019-06-27 – 2019-06-29 (×5): 100 mg
  Filled 2019-06-27 (×6): qty 10

## 2019-06-27 MED ORDER — POLYETHYLENE GLYCOL 3350 17 G PO PACK
17.0000 g | PACK | Freq: Once | ORAL | Status: AC
  Administered 2019-06-27: 17 g via ORAL
  Filled 2019-06-27: qty 1

## 2019-06-27 MED ORDER — POTASSIUM CHLORIDE 20 MEQ PO PACK
40.0000 meq | PACK | Freq: Once | ORAL | Status: AC
  Administered 2019-06-27: 40 meq via NASOGASTRIC
  Filled 2019-06-27: qty 2

## 2019-06-27 MED ORDER — IOHEXOL 300 MG/ML  SOLN
100.0000 mL | Freq: Once | INTRAMUSCULAR | Status: AC | PRN
  Administered 2019-06-27: 100 mL via INTRAVENOUS

## 2019-06-27 NOTE — Plan of Care (Signed)
Increased feeding tube rate to 70LK/HV without complications. Will continue to monitor.  [name] Foster

## 2019-06-27 NOTE — TOC Progression Note (Signed)
Transition of Care [facility]) - Progression Note    Patient Details  Name: [name] Foster MRN: [id number] Date of Birth: 01-Jul-1941  Transition of Care [facility]) CM/SW [place], LCSW Phone Number: 06/27/2019, 9:49 AM  Clinical Narrative: Notified Flo Shanks, RN with [facility] of outpatient palliative referral.    Expected Discharge Plan: [place] Barriers to Discharge: Continued Medical Work up  Expected Discharge Plan and Services Expected Discharge Plan: [place] In-house Referral: Clinical Social Work Discharge Planning Services: CM Consult Post Acute Care Choice: [place] arrangements for the past 2 months: Single Family Home                           HH Arranged: RN, PT, Nurse's Aide [place] Agency: [place] ([facility]) Date HH Agency Contacted: 06/25/19 Time HH Agency Contacted: 1300 Representative spoke with at [place]: [place] ([place]) Interventions    Readmission Risk Interventions Readmission Risk Prevention Plan 06/25/2019  Transportation Screening Complete  PCP or Specialist Appt within 5-7 Days Complete  Home Care Screening Complete  Medication Review (RN CM) Complete  Some recent data might be hidden

## 2019-06-27 NOTE — Progress Notes (Signed)
Daily Progress Note   Patient Name: [name] Foster       Date: 06/27/2019 DOB: [birth year]  Age: 78 y.o. MRN#: [id number] Attending Physician: Otila Back, MD Primary Care Physician: Cyndie Chime, MD Admit Date: 06/24/2019  Reason for Consultation/Follow-up: Psychosocial/spiritual support  Subjective: Patient is resting in bed on his side with eyes closed. His daughter is at bedside. She is a Marine scientist. 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  He had a hip replacement June 16th, did well with rehab, and then started Methotrexate for RA. She states he began to decline with decreased mobility, and not eating well with 60 pounds of weight loss after starting this. She tells me hospice was due to start given his decline, but now she realizes his decline was due to Methotrexate and is hopeful he will improve. She states the DHT is temporary, and she is hopeful his appetite and swallowing will improve so that he does not need a PEG. She feels he has a lot of life left in him. Palliative to follow outpatient.    Length of Stay: 3  Current Medications: Scheduled Meds:  . docusate  100 mg Per Tube BID  . doxazosin  4 mg Oral Daily  . feeding supplement  1 Container Oral TID BM  . feeding supplement (PRO-STAT SUGAR FREE 64)  30 mL Per Tube BID  . ferrous sulfate  220 mg Per Tube BID WC  . finasteride  5 mg Oral Daily  . free water  115 mL Per Tube Q4H  . mometasone-formoterol  2 puff Inhalation BID  . OLANZapine  2.5 mg Oral QHS  . pantoprazole  40 mg Oral Daily    Continuous Infusions: . sodium chloride Stopped (06/26/19 0905)  . sodium chloride 100 mL/hr at 06/27/19 1242  . feeding supplement (OSMOLITE 1.5 CAL) 1,000 mL  (06/26/19 1537)    PRN Meds: sodium chloride, acetaminophen **OR** acetaminophen, albuterol, ondansetron **OR** ondansetron (ZOFRAN) IV, polyethylene glycol  Physical Exam Constitutional:      Comments: Eyes opened briefly on entry.   HENT:     Head:     Comments: DHT in place.  Pulmonary:     Effort: Pulmonary effort is normal.  Vital Signs: BP 118/68 (BP Location: Left Arm)   Pulse 97   Temp 98.8 F (37.1 C) (Oral)   Resp (!) 24   Ht 5\' 8"  (1.727 m)   Wt 62.8 kg   SpO2 98%   BMI 21.05 kg/m  SpO2: SpO2: 98 % O2 Device: O2 Device: Room Air O2 Flow Rate: O2 Flow Rate (L/min): 2 L/min  Intake/output summary:   Intake/Output Summary (Last 24 hours) at 06/27/2019 1436 Last data filed at 06/27/2019 1327 Gross per 24 hour  Intake 450 ml  Output 325 ml  Net 125 ml   LBM: Last BM Date: 06/24/19 Baseline Weight: Weight: 69 kg Most recent weight: Weight: 62.8 kg       Palliative Assessment/Data:      Patient Active Problem List   Diagnosis Date Noted  . Protein-calorie malnutrition, severe 06/26/2019  . Normocytic anemia 06/24/2019  . Thrombocytosis ([place]) 06/24/2019  . Other dysphagia 06/24/2019  . Dysphagia 06/24/2019  . Palliative care by specialist 06/02/2019  . Iron deficiency anemia 06/02/2019  . 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 Generalized weakness 06/02/2019  . Panlobular emphysema ([place]) 06/02/2019  . Pneumonia 05/25/2019  . Sepsis ([place])   . Goals of care, counseling/discussion   . DNR (do not resuscitate) discussion   . Atypical pneumonia   . H/O bilateral hip replacements 04/26/2019  . 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 Seropositive rheumatoid arthritis ([place]) 04/21/2019  . Incomplete emptying of bladder 02/03/2017  . 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 Abnormal finding on chest xray 07/18/2013  . Chronic cough 07/18/2013  . Neutropenia ([place]) 09/03/2012  . Tests ordered 09/03/2012  . PIN III  (prostatic intraepithelial neoplasm III) 08/06/2012  . Benign localized prostatic hyperplasia with lower urinary tract symptoms (LUTS) 07/07/2012  . Elevated prostate specific antigen (PSA) 07/07/2012    Palliative Care Assessment & Plan    Recommendations/Plan:  Patient D/Cing with palliative to follow.     Code Status:    Code Status Orders  (From admission, onward)         Start     Ordered   06/24/19 1942  Do not attempt resuscitation (DNR)  Continuous    Question Answer Comment  In the event of cardiac or respiratory ARREST Do not call a "code blue"   In the event of cardiac or respiratory ARREST Do not perform Intubation, CPR, defibrillation or ACLS   In the event of cardiac or respiratory ARREST Use medication by any route, position, wound care, and other measures to relive pain and suffering. May use oxygen, suction and manual treatment of airway obstruction as needed for comfort.      06/24/19 1943        Code Status History    Date Active Date Inactive Code Status Order ID Comments User Context   05/25/2019 0958 05/27/2019 1724 Full Code [id number]  Max Sane, MD Inpatient   04/26/2019 1243 04/28/2019 1421 Full Code [id number]  Hessie Knows, MD Inpatient   Advance Care Planning Activity       Prognosis:   Unable to determine   Thank you for allowing the Palliative Medicine Team to assist in the care of this patient.   Total Time 35 min Prolonged Time Billed No       Greater than 50%  of this time was spent counseling and coordinating care related to the above assessment and plan.  Asencion Gowda, NP  Please contact Palliative Medicine Team phone at [phone]  for questions and concerns.

## 2019-06-27 NOTE — Progress Notes (Signed)
[name] Foster , MD [address] [place], [place], [place], [address] [address], [place], [place], [place], [address] Phone: [phone]  Fax: [phone]   [name] Foster is being followed for failure to thrive and difficulty swallowing day 2 of follow up   Subjective: Denies any difficulty with swallowing.  He just states that food does not taste right and has an abnormal sensation in his mouth.   Objective: Vital signs in last 24 hours: Vitals:   06/26/19 1443 06/26/19 2023 06/27/19 0416 06/27/19 0418  BP: 112/60 120/61 118/68   Pulse: 90 99 97   Resp: 16 (!) 24 (!) 24   Temp: 98.9 F (37.2 C) 99.2 F (37.3 C) 98.8 F (37.1 C)   TempSrc: Oral Oral Oral   SpO2: 97% 100% 98%   Weight:    62.8 kg  Height:       Weight change:   Intake/Output Summary (Last 24 hours) at 06/27/2019 6378 Last data filed at 06/27/2019 5885 Gross per 24 hour  Intake 1852.04 ml  Output 425 ml  Net 1427.04 ml     Exam: Heart:: Regular rate and rhythm, S1S2 present or without murmur or extra heart sounds Lungs: normal, clear to auscultation and clear to auscultation and percussion Abdomen: soft, nontender, normal bowel sounds   Lab Results: @LABTEST2 @ Micro Results: Recent Results (from the past 240 hour(s))  SARS CORONAVIRUS 2 Nasal Swab Aptima Multi Swab     Status: None   Collection Time: 06/24/19  6:48 PM   Specimen: Aptima Multi Swab; Nasal Swab  Result Value Ref Range Status   SARS Coronavirus 2 NEGATIVE NEGATIVE Final    Comment: (NOTE) SARS-CoV-2 target nucleic acids are NOT DETECTED. The SARS-CoV-2 RNA is generally detectable in upper and lower respiratory specimens during the acute phase of infection. Negative results do not preclude SARS-CoV-2 infection, do not rule out co-infections with other pathogens, and should not be used as the sole basis for treatment or other patient management decisions. Negative results must be combined with clinical observations,  patient history, and epidemiological information. The expected result is Negative. Fact Sheet for Patients: SugarRoll.be Fact Sheet for Healthcare Providers: https://www.woods-mathews.com/ This test is not yet approved or cleared by the [place] FDA and  has been authorized for detection and/or diagnosis of SARS-CoV-2 by FDA under an Emergency Use Authorization (EUA). This EUA will remain  in effect (meaning this test can be used) for the duration of the COVID-19 declaration under Section 56 4(b)(1) of the Act, 21 U.S.C. section 360bbb-3(b)(1), unless the authorization is terminated or revoked sooner. Performed at [facility], [place] [address] [place], [place] [address]    Studies/Results: No results found. Medications: I have reviewed the patient's current medications. Scheduled Meds: . doxazosin  4 mg Oral Daily  . feeding supplement  1 Container Oral TID BM  . feeding supplement (PRO-STAT SUGAR FREE 64)  30 mL Per Tube BID  . finasteride  5 mg Oral Daily  . free water  115 mL Per Tube Q4H  . mometasone-formoterol  2 puff Inhalation BID  . OLANZapine  2.5 mg Oral QHS  . pantoprazole  40 mg Oral Daily   Continuous Infusions: . sodium chloride Stopped (06/26/19 0905)  . feeding supplement (OSMOLITE 1.5 CAL) 1,000 mL (06/26/19 1537)   PRN Meds:.sodium chloride, acetaminophen **OR** acetaminophen, albuterol, ondansetron **OR** ondansetron (ZOFRAN) IV, polyethylene glycol   Assessment: Active Problems:   Dysphagia   Protein-calorie malnutrition, severe  [name] Foster 78 y.o. male underwent hip replacement on June 6.  Acutely developed what appeared of 2 weeks loss of appetite weight loss.  Admitted to [facility] for pneumonia resulting in sepsis.  Refusing to eat anything but only drinking liquids. EGD performed on 06/26/2019 which showed no gross abnormalities to contribute towards the weight loss.  Feeding tube placed  endoscopically.  Iron levels are low on iron studies.  He denies any dysphagia.  His main issue is that he states that the food does not feel and taste right in his mouth.  I spoke to him in the presence of his daughter and we both agreed that there is no easy fix for this abnormal sensation in his mouth that he feels.  It probably may be related to the methotrexate which she clearly states changed the way he started to eat and drink once he was commenced in June.  It has since been stopped.  Plan: 1.  Continue tube feeding 2.  Commence on clears and advanced to a pured diet.  If he tolerates that we can gradually advance him to a regular diet. 3.  Input from dietitian. 4.  I do not believe he requires any further radiological evaluation at this point of time as he has no issues with swallowing as such   LOS: 3 days   [name] Bellows, MD 06/27/2019, 8:22 AM

## 2019-06-27 NOTE — Progress Notes (Signed)
[facility] [place] NAME: [name] Foster    MR#:  [id number]  DATE OF BIRTH:  30-Jul-1941  SUBJECTIVE:  CHIEF COMPLAINT:   Chief Complaint  Patient presents with  . Feeding tube placement   No new complaint this morning.  Patient has Dobbhoff tube in place and currently tolerating tube feeds at current rate.  No fevers.  REVIEW OF SYSTEMS:  ROS Constitutional: Negative for chills and fever.  HENT: Negative for sore throat.   Eyes: Negative for blurred vision, double vision and pain.  Respiratory: Negative for cough, hemoptysis, shortness of breath and wheezing.   Cardiovascular: Negative for chest pain, palpitations, orthopnea and leg swelling.  Gastrointestinal: Positive for difficulty swallowing.  Negative for abdominal pain, constipation, diarrhea, heartburn, nausea and vomiting.  Genitourinary: Negative for dysuria and hematuria.  Musculoskeletal: Negative for back pain and joint pain.  Skin: Negative for rash.  Neurological: Negative for sensory change, speech change, focal weakness and headaches.  Endo/Heme/Allergies: Does not bruise/bleed easily.  Psychiatric/Behavioral: Negative for depression. The patient is not nervous  DRUG ALLERGIES:   Allergies  Allergen Reactions  . Methotrexate     Other reaction(s): Other (See Comments) Weakness,    VITALS:  Blood pressure 118/68, pulse 97, temperature 98.8 F (37.1 C), temperature source Oral, resp. rate (!) 24, height 5\' 8"  (1.727 m), weight 62.8 kg, SpO2 98 %. PHYSICAL EXAMINATION:  Physical Exam   GENERAL:  78 y.o.-year-old patient lying in the bed with no acute distress.  EYES: Pupils equal, round, reactive to light and accommodation. No scleral icterus. Extraocular muscles intact.  HEENT: Has Dobbhoff tube in place.  Oropharynx and nasopharynx clear. No oropharyngeal erythema, dry oral mucosa  NECK:  Supple, no jugular venous distention. No thyroid enlargement, no  tenderness.  LUNGS: Normal breath sounds bilaterally, no wheezing, rales, rhonchi. No use of accessory muscles of respiration.  CARDIOVASCULAR: S1, S2 normal. No murmurs, rubs, or gallops.  ABDOMEN: Soft, nontender, nondistended. Bowel sounds present. No organomegaly or mass.  EXTREMITIES: No pedal edema, cyanosis, or clubbing. + 2 pedal & radial pulses b/l.   NEUROLOGIC: Cranial nerves II through XII are intact. No focal Motor or sensory deficits appreciated b/l PSYCHIATRIC: The patient is alert and oriented x 3.   Moving all extremities with no focal deficit SKIN: No obvious rash, lesion, or ulcer.   LABORATORY PANEL:  Male CBC Recent Labs  Lab 06/26/19 0531  WBC 8.0  HGB 7.4*  HCT 24.5*  PLT 532*   ------------------------------------------------------------------------------------------------------------------ Chemistries  Recent Labs  Lab 06/24/19 1400  06/27/19 0352  NA 134*   < > 134*  K 4.1   < > 3.1*  CL 93*   < > 91*  CO2 27   < > 29  GLUCOSE 122*   < > 166*  BUN 14   < > 7*  CREATININE 0.54*   < > 0.32*  CALCIUM 8.4*   < > 7.6*  MG  --    < > 1.9  AST 25  --   --   ALT 13  --   --   ALKPHOS 126  --   --   BILITOT 1.4*  --   --    < > = values in this interval not displayed.   RADIOLOGY:  No results found. ASSESSMENT AND PLAN:   *Dysphagia to solids.   Tolerating thin liquids.  Etiology unclear.  On further review of the chart patient noted  to have had CT abdomen and pelvis and chest on 05/25/2019.  This morning patient reported the symptoms of dysphagia started within the last 1 to 2 weeks after the last CT scan. Since etiology of dysphagia not yet found, I have requested for repeat imaging with CT abdomen and pelvis with chest today. Patient seen by gastroenterologist and status post EGD with Dobbhoff tube placement on 06/26/2019.  Tube feeding initiated yesterday to ensure patient can tolerate tube feeds before placement of PEG tube placement. I later  discussed with patient's daughter Ms. [name] who visited with dad this morning.  She reported patient was treated with methotrexate and first week of July by his rheumatologist for rheumatoid arthritis and she states that patient's overall clinical condition has been declining since then.  Patient however stated to me the difficulty with swallowing started within the last 2 weeks or so.  *Dehydration due to decreased oral intake.    Continue IV fluids.  *Interstitial lung disease/emphysema.  Not on oxygen.  No significant shortness of breath.  Nebulizers as needed  *Seropositive rheumatoid arthritis.  Not on treatment at this time.  *Severe protein calorie malnutrition.  Protein supplement ordered.  *Anemia Likely due to anemia of chronic disease. Follow-up CBC in a.m.  Iron studies done with iron saturation of 12% suggestive of iron deficiency anemia.  Started on ferrous sulfate  DVT prophylaxis ; Lovenox discontinued previously due to drop in hemoglobin. Ordered SCDs   All the records are reviewed and case discussed with Care Management/Social Worker. Management plans discussed with the patient, family and they are in agreement. I updated patient's daughter Ms. Bridget at bedside on treatment plans.  All questions were answered.  She is in agreement with the plan of care as outlined above. I Was Notified by Case Manager primary care physician and patient will begin discussion about setting up home health with palliative care on discharge from the hospital.  CODE STATUS: DNR  TOTAL TIME TAKING CARE OF THIS PATIENT: 34 minutes.   More than 50% of the time was spent in counseling/coordination of care: YES  POSSIBLE D/C IN 2 DAYS, DEPENDING ON CLINICAL CONDITION.   Dianelly Ferran M.D on 06/27/2019 at 10:50 AM  Between 7am to 6pm - Pager - [phone]  After 6pm go to [email or web address] - Proofreader  [facility]  Office  [phone]  CC: Primary  care physician; Cyndie Chime, MD  Note: This dictation was prepared with Dragon dictation along with smaller phrase technology. Any transcriptional errors that result from this process are unintentional.

## 2019-06-27 NOTE — Evaluation (Signed)
Clinical/Bedside Swallow Evaluation Patient Details  Name: [name] Foster MRN: [id number] Date of Birth: [birth year]  Today's Date: 06/27/2019 Time: SLP Start Time (ACUTE ONLY): 39 SLP Stop Time (ACUTE ONLY): 1305 SLP Time Calculation (min) (ACUTE ONLY): 45 min  Past Medical History:  Past Medical History:  Diagnosis Date  . Arthritis   . BPH (benign prostatic hyperplasia)   . Former smoker, stopped smoking many years ago   . History of needle biopsy of prostate with negative result 2013  . Hypertension   . Prostate enlargement   . Prostatic intraepithelial neoplasm III 2013   Past Surgical History:  Past Surgical History:  Procedure Laterality Date  . ESOPHAGOGASTRODUODENOSCOPY N/A 06/26/2019   Procedure: ESOPHAGOGASTRODUODENOSCOPY (EGD);  Surgeon: Lin Landsman, MD;  Location: [facility] ENDOSCOPY;  Service: Gastroenterology;  Laterality: N/A;  . HEMORROIDECTOMY    . HERNIA REPAIR    . JOINT REPLACEMENT Left    THR  . PROSTATE BIOPSY  2013  . TOTAL HIP ARTHROPLASTY Left   . TOTAL HIP ARTHROPLASTY Right 04/26/2019   Procedure: TOTAL HIP ARTHROPLASTY ANTERIOR APPROACH;  Surgeon: Hessie Knows, MD;  Location: [facility] ORS;  Service: Orthopedics;  Laterality: Right;   HPI:  Pt is a 78 y.o. 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 Family states that they were discussing with oncologist today who recommended the patient come in for feeding tube placement. The patient apparently has not eaten for the past 5 days. Has been taking sips of water and ginger ale. Family says that the patient has a hard time swallowing. They have also noticed  increased weakness.  Patient tolerated thin liquids.  He has loss 50 pounds in the last few months.  Worsening weakness and is unable to walk at this time.  He does have some nausea but controlled with Zofran.  Does not complain of any odynophagia.  No abdominal pain.  Patient has been following up with oncology Dr. Tasia Catchings.  During prior admission patient had started methotrexate and received 2 doses.  Family believes his multiple problems are secondary to methotrexate (it has the side effect of decreased appetite, tender gums) -- it has since been stopped ~1 month ago.  Unsure of pt's baseline Cognitive functioning; no Head CT per Imaging section of chart.  Pt lives w/ Wife at home; family near to support. Per Office Visit notes in chart, pt has been losing Weight since at least April of 2020, maybe prior -- Dietician noted same.  Pt's complaint appears centered around meats, solid foods; Dtr agreed.  Pt has a Dobhoff feeding tube in place for nutritional support; on a full liquid diet per GI.  Noted CXR is improved per report.    Assessment / Plan / Recommendation Clinical Impression  Pt appears to present w/ adequate oropharyngeal phase swallow function w/ no overt, clinical s/s of aspiration; no decline in respiratory status during/post po trials. Pt appears at reduced risk for aspiration when following general aspiration precautions w/ oral intake. Pt consumed few po trials of thin liquids via Straw and purees after setup given w/ no overt s/s of aspiration noted; no cough or wet vocal quality following trials. No apparent s/s pharyngeal residue remaining post swallow. During  the oral phase, pt exhibited adequate oral phase time/effort w/ the trials; appropriate bolus management and oral clearing w/ trials. No solids were attempted at this evaluation - will assess further during tx. OM exam was [facility]. Pt able feed self. No c/o Odynophagia; pt stated he "has never had a sore mouth" as reported. When questioned  further about the nature of his eating behaviors and deficits in recent months, he stated the reason why he did not solids (the complaint) was that it was "too tough to chew the meats... they grew larger the longer they stayed in my mouth". He also stated he "was not interested in the foods anymore". He seemed unaware of the weight loss ongoing back in the Spring b/f the Hip surgery and MXT medication for RA. The oral phase complaints can be seen w/ issues such as Cognitive decline w/ regard to oral intake and perceptions. Recommend pt and family ID 3 solid foods of preference to try tomorrow during tx session in order to better assess oral phase bolus management. Possibly a more Mech Soft or Minced foods diet consistency w/ Gravy added to moisten might be beneficial; Thin liquids. Recommend general aspiration precautions; assistance during all meals w/ setup. Recommend Pills in Puree for safer swallowing as needed. Recommend f/u by Dietician for support as needed -- drink supplement recommended. NSG/MDs updated.  SLP Visit Diagnosis: Dysphagia, oral phase (R13.11)(suspect could be a component of Cognitive impact)    Aspiration Risk  (reduced when following general precs)    Diet Recommendation  continue current diet as ordered by GI w/ therapeutic trials of mech soft foods in order to upgrade diet consistency to least restrictive consistency; Thin liquids. General aspiration precautions; Reflux precautions.   Medication Administration: Whole meds with puree(if needed for easier swallowing)    Other  Recommendations Recommended Consults: (GI and Dietician f/u) Oral Care Recommendations: Oral care BID;Patient independent with oral care(support) Other Recommendations: (n/a)   Follow up Recommendations None(TBD)      Frequency and Duration min 3x week  1 week       Prognosis Prognosis for Safe Diet Advancement: Fair(-Good) Barriers to Reach Goals: Time post onset;Severity of deficits(unsure if  any baseline Cognitive decline(?))      Swallow Study   General Date of Onset: 06/24/19 HPI: Pt is a 78 y.o. 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 Family states that they were discussing with oncologist today who recommended the patient come in for feeding tube placement. The patient apparently has not eaten for the past 5 days. Has been taking sips of water and ginger ale. Family says that the patient has a hard time swallowing. They have also noticed increased weakness.  Patient tolerated thin liquids.  He has loss 50 pounds in the last few months.  Worsening weakness and is unable to walk at this time.  He does have some nausea but controlled with Zofran.  Does not complain of any odynophagia.  No abdominal pain.  Patient has been following up with oncology Dr. Tasia Catchings.  During prior admission patient had started methotrexate and received 2 doses.  Family believes his multiple problems are secondary to methotrexate (it has the side effect of decreased appetite,  tender gums) -- it has since been stopped ~1 month ago.  Unsure of pt's baseline Cognitive functioning; no Head CT per Imaging section of chart.  Pt lives w/ Wife at home; family near to support. Per Office Visit notes in chart, pt has been losing Weight since at least April of 2020, maybe prior -- Dietician noted same.  Pt's complaint appears centered around meats, solid foods; Dtr agreed.  Pt has a Dobhoff feeding tube in place for nutritional support; on a full liquid diet per GI.  Noted CXR is improved per report.  Type of Study: Bedside Swallow Evaluation Previous Swallow Assessment: none  reported Diet Prior to this Study: Thin liquids(Full Liquids currently per GI; regular diet at home) Temperature Spikes Noted: No(wbc 8.0) Respiratory Status: Nasal cannula(2 liters) History of Recent Intubation: No Behavior/Cognition: Alert;Cooperative;Pleasant mood(verbal) Oral Cavity Assessment: Within Functional Limits Oral Care Completed by SLP: Recent completion by staff Oral Cavity - Dentition: Adequate natural dentition Vision: Functional for self-feeding Self-Feeding Abilities: Able to feed self;Needs assist;Needs set up Patient Positioning: Upright in bed Baseline Vocal Quality: Normal Volitional Cough: Strong Volitional Swallow: Able to elicit    Oral/Motor/Sensory Function Overall Oral Motor/Sensory Function: Within functional limits   Ice Chips Ice chips: Within functional limits Other Comments: per report   Thin Liquid Thin Liquid: Within functional limits Presentation: Self Fed;Straw(6 trials)    Nectar Thick Nectar Thick Liquid: Not tested   Honey Thick Honey Thick Liquid: Not tested   Puree Puree: Within functional limits Presentation: Spoon(fed; 5 trials)   Solid     Solid: Not tested Other Comments: will attempt tomorrow        Orinda Kenner, MS, CCC-SLP , 06/27/2019,5:16 PM

## 2019-06-27 NOTE — Progress Notes (Signed)
New referral for outpatient Palliative to follow at home received from [facility]. Patient will also be followed by [facility]. Patient information faxed to referral. Flo Shanks BSN, RN, [place] [phone]

## 2019-06-27 NOTE — Anesthesia Postprocedure Evaluation (Signed)
Anesthesia Post Note  Patient: [name] Foster  Procedure(s) Performed: ESOPHAGOGASTRODUODENOSCOPY (EGD) (N/A )  Patient location during evaluation: Endoscopy Anesthesia Type: General Level of consciousness: awake and alert Pain management: pain level controlled Vital Signs Assessment: post-procedure vital signs reviewed and stable Respiratory status: spontaneous breathing, nonlabored ventilation and respiratory function stable Cardiovascular status: blood pressure returned to baseline and stable Postop Assessment: no apparent nausea or vomiting Anesthetic complications: no     Last Vitals:  Vitals:   06/26/19 2023 06/27/19 0416  BP: 120/61 118/68  Pulse: 99 97  Resp: (!) 24 (!) 24  Temp: 37.3 C 37.1 C  SpO2: 100% 98%    Last Pain:  Vitals:   06/27/19 0416  TempSrc: Oral  PainSc:                  [name]

## 2019-06-27 NOTE — Care Management Important Message (Signed)
Important Message  Patient Details  Name: [name] Foster MRN: [id number] Date of Birth: 02-Oct-1941   Medicare Important Message Given:  Yes     Dannette Barbara 06/27/2019, 12:11 PM

## 2019-06-28 DIAGNOSIS — K5900 Constipation, unspecified: Secondary | ICD-10-CM

## 2019-06-28 LAB — GLUCOSE, CAPILLARY
Glucose-Capillary: 126 mg/dL — ABNORMAL HIGH (ref 70–99)
Glucose-Capillary: 136 mg/dL — ABNORMAL HIGH (ref 70–99)
Glucose-Capillary: 160 mg/dL — ABNORMAL HIGH (ref 70–99)
Glucose-Capillary: 165 mg/dL — ABNORMAL HIGH (ref 70–99)
Glucose-Capillary: 176 mg/dL — ABNORMAL HIGH (ref 70–99)
Glucose-Capillary: 194 mg/dL — ABNORMAL HIGH (ref 70–99)

## 2019-06-28 LAB — BASIC METABOLIC PANEL
Anion gap: 5 (ref 5–15)
BUN: 5 mg/dL — ABNORMAL LOW (ref 8–23)
CO2: 30 mmol/L (ref 22–32)
Calcium: 7.5 mg/dL — ABNORMAL LOW (ref 8.9–10.3)
Chloride: 98 mmol/L (ref 98–111)
Creatinine, Ser: <0.3 mg/dL — ABNORMAL LOW (ref 0.61–1.24)
Glucose, Bld: 178 mg/dL — ABNORMAL HIGH (ref 70–99)
Potassium: 3.1 mmol/L — ABNORMAL LOW (ref 3.5–5.1)
Sodium: 133 mmol/L — ABNORMAL LOW (ref 135–145)

## 2019-06-28 LAB — CBC
HCT: 25.5 % — ABNORMAL LOW (ref 39.0–52.0)
Hemoglobin: 7.6 g/dL — ABNORMAL LOW (ref 13.0–17.0)
MCH: 24.1 pg — ABNORMAL LOW (ref 26.0–34.0)
MCHC: 29.8 g/dL — ABNORMAL LOW (ref 30.0–36.0)
MCV: 81 fL (ref 80.0–100.0)
Platelets: 543 10*3/uL — ABNORMAL HIGH (ref 150–400)
RBC: 3.15 MIL/uL — ABNORMAL LOW (ref 4.22–5.81)
RDW: 17 % — ABNORMAL HIGH (ref 11.5–15.5)
WBC: 7.7 10*3/uL (ref 4.0–10.5)
nRBC: 0 % (ref 0.0–0.2)

## 2019-06-28 LAB — MAGNESIUM: Magnesium: 1.7 mg/dL (ref 1.7–2.4)

## 2019-06-28 LAB — PHOSPHORUS: Phosphorus: 2.3 mg/dL — ABNORMAL LOW (ref 2.5–4.6)

## 2019-06-28 LAB — SURGICAL PATHOLOGY

## 2019-06-28 MED ORDER — POTASSIUM PHOSPHATES 15 MMOLE/5ML IV SOLN
20.0000 mmol | Freq: Once | INTRAVENOUS | Status: AC
  Administered 2019-06-28: 20 mmol via INTRAVENOUS
  Filled 2019-06-28: qty 6.67

## 2019-06-28 MED ORDER — MAGNESIUM SULFATE IN D5W 1-5 GM/100ML-% IV SOLN
1.0000 g | Freq: Once | INTRAVENOUS | Status: AC
  Administered 2019-06-28: 1 g via INTRAVENOUS
  Filled 2019-06-28: qty 100

## 2019-06-28 MED ORDER — POLYETHYLENE GLYCOL 3350 17 G PO PACK
17.0000 g | PACK | Freq: Every day | ORAL | Status: DC
  Administered 2019-06-28: 17 g via ORAL
  Filled 2019-06-28 (×2): qty 1

## 2019-06-28 MED ORDER — POTASSIUM CHLORIDE 10 MEQ/100ML IV SOLN: 10.0000 meq | INTRAVENOUS | Status: DC

## 2019-06-28 MED ORDER — METOCLOPRAMIDE HCL 5 MG/ML IJ SOLN: 5.0000 mg | Freq: Three times a day (TID) | INTRAMUSCULAR | Status: DC | PRN

## 2019-06-28 MED ORDER — FLEET ENEMA 7-19 GM/118ML RE ENEM: 1.0000 | ENEMA | Freq: Once | RECTAL | Status: DC

## 2019-06-28 NOTE — Progress Notes (Addendum)
Speech Language Pathology Treatment: Dysphagia  Patient Details Name: [name] Foster MRN: [id number] DOB: 11-27-1940 Today's Date: 06/28/2019 Time: 1210-1300 SLP Time Calculation (min) (ACUTE ONLY): 50 min  Assessment / Plan / Recommendation Clinical Impression  Pt seen today for ongoing assessment of toleration of diet; trials to upgrade diet. Pt has a Dobhoff placed for nutritional support but now c/o "feeling full" and not wanting to eat. Per GI note today, a significant issue per pt is that he feels that the food "does not feel and taste right" in his mouth. Family felt it was related to the methotrexate medication he was given in June 2020 - however, pt exhibited weight loss back in April and May of 2020 according to chart notes of Office Visits to MDs. Pt also has h/o recent hip surgery and anemia of chronic disease per chart notes. Noted pt's current issue w/ "not having had a bowel movement in 9 days" and needing an enema, Miralax per GI note. Reglan has also been ordered per chart.  Pt verbally conversive; pleasant. Wife present. Pt ID'd 2-3 foods to try at Lunch meal. Food and tray prepped then pt fed self. He consumed trials of Baked Sweet potato, Peaches, and apple juice w/ NO overt s/s of aspiration noted; NO oral or pharyngeal phase deficits noted. Pt masticated soft foods appropriately; timely A-P transfer and swallow noted. Oral clearing achieved w/ all trials. Wife present to witness and hear pt's feelings on the foods/meals he felt pressured to eat even at home.   Discussed pt's overall presentation re: swallowing w/ pt and Wife in room. Education given to Wife and pt on food consistencies, options, and preparation; the impact of perceptions about foods on our desire to eat them as well as any GI issues such as constipation and not having regular bowel movements also having an impact on desire to eat. Pt again stated he did NOT want a feeding tube - suggested pt would not have  increased feelings of hunger or desire to eat foods if he felt "full all the time" as he does w/ the current tube feedings. Wife gave agreement to pt's statements; and SLP discussion.  Recommend upgrade diet to a mech soft diet w/ thin liquids; general aspiration precautions. Discussed w/ pt and Wife to choose foods of preference and avoid meats - try soft, cooked foods including flavored foods and soups. Recommended f/u w/ Dietician to develop a meal plan (mini meals vs 3 melas daily) to help meet pt's nutritional needs, including drink supplements and when to drink them in order to not feel overly full just before meal times. Recommend trial removal of Dobhoff to allow pt to feel full to have desire to eat - pt was not a large eater prior to admission per his report. This was discussed w/ pt, Wife, NSG and MD.  ST services can be available if any needs re: oropharyngeal phase dysphagia.     HPI HPI: Pt is a 78 y.o. 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 Family states that they were discussing with oncologist today who recommended the patient come in for feeding tube placement. The patient apparently has not eaten for the past 5 days. Has  been taking sips of water and ginger ale. Family says that the patient has a hard time swallowing. They have also noticed increased weakness.  Patient tolerated thin liquids.  He has loss 50 pounds in the last few months.  Worsening weakness and is unable to walk at this time.  He does have some nausea but controlled with Zofran.  Does not complain of any odynophagia.  No abdominal pain.  Patient has been following up with  oncology [name] Foster.  During prior admission patient had started methotrexate and received 2 doses.  Family believes his multiple problems are secondary to methotrexate (it has the side effect of decreased appetite, tender gums) -- it has since been stopped ~1 month ago.  Unsure of pt's baseline Cognitive functioning; no Head CT per Imaging section of chart.  Pt lives w/ Wife at home; family near to support. Per Office Visit notes in chart, pt has been losing Weight since at least April of 2020, maybe prior -- Dietician noted same.  Pt's complaint appears centered around meats, solid foods; Dtr agreed.  Pt has a Dobhoff feeding tube in place for nutritional support; on a full liquid diet per GI.  Noted CXR is improved per report.   Pt stated he is "not hungry at all with this tube feeding going". Pt also stated to this SLP that he "he did not want a tube". This was relayed to the MD.       SLP Plan  All goals met       Recommendations  Diet recommendations: Dysphagia 3 (mechanical soft)(meats well cut, moistened w/ gravy for desire) Liquids provided via: Cup;Straw Medication Administration: Whole meds with liquid(w/ a puree if needed for easier swallowing) Supervision: Patient able to self feed Compensations: Minimize environmental distractions;Slow rate;Small sips/bites;Lingual sweep for clearance of pocketing;Follow solids with liquid Postural Changes and/or Swallow Maneuvers: Seated upright 90 degrees;Upright 30-60 min after meal                General recommendations: (Dietician consult) AND a Palliative care consult to address pt's wishes/GOC prior to PEG placement Oral Care Recommendations: Oral care BID;Patient independent with oral care(support) Follow up Recommendations: None SLP Visit Diagnosis: Dysphagia, unspecified (P79.43)(EXMDYJ for certain foods(meats)) Plan: All goals met       GO                 [name] Kenner, MS, CCC-SLP [name] Foster 06/28/2019, 2:39  PM

## 2019-06-28 NOTE — Consult Note (Signed)
PHARMACY CONSULT NOTE - FOLLOW UP  Pharmacy Consult for Electrolyte Monitoring and Replacement   Recent Labs: Potassium (mmol/L)  Date Value  06/28/2019 3.1 (L)  04/29/2013 3.8   Magnesium (mg/dL)  Date Value  06/28/2019 1.7   Calcium (mg/dL)  Date Value  06/28/2019 7.5 (L)   Calcium, Total (mg/dL)  Date Value  04/29/2013 8.2 (L)   Albumin (g/dL)  Date Value  06/24/2019 1.7 (L)  07/30/2012 3.6   Phosphorus (mg/dL)  Date Value  06/28/2019 2.3 (L)   Sodium (mmol/L)  Date Value  06/28/2019 133 (L)  04/29/2013 136   Corrected Ca: 10.04 mg/dL  Assessment: 78 y.o. male with a H/O of RA, emphysema, interstitial lung disease presents to the ED brought in by family due to weakness, dysphagia not tolerating solids.  Patient seen by gastroenterologist and status post EGD with Dobbhoff tube placement on 06/26/2019. He is at high risk of refeeding  Goal of Therapy:  Electrolytes WNL  Plan:   Replace potassium and phosphorous with 20 mmol IV potassium phosphate. This provides 29 mEq potassium  Magnesium is borderline: replace with 1 gram IV magnesium sulfate  Recheck electrolytes again with am labs  Dallie Piles ,PharmD Clinical Pharmacist 06/28/2019 9:09 AM

## 2019-06-28 NOTE — Progress Notes (Addendum)
[place] [facility] [place] NAME: [name] Foster    MR#:  [id number]  DATE OF BIRTH:  Feb 16, 1941  SUBJECTIVE:  CHIEF COMPLAINT:   Chief Complaint  Patient presents with  . Feeding tube placement   Patient is feeling full and could not drink much patient ,has Dobbhoff tube in place and currently tolerating tube feeds at current rate.  Patient strongly believes that if the tube feed rate is decreased he might be able to tolerate more p.o. wife at bedside   REVIEW OF SYSTEMS:  ROS Constitutional: Negative for chills and fever.  HENT: Negative for sore throat.   Eyes: Negative for blurred vision, double vision and pain.  Respiratory: Negative for cough, hemoptysis, shortness of breath and wheezing.   Cardiovascular: Negative for chest pain, palpitations, orthopnea and leg swelling.  Gastrointestinal: Positive for difficulty swallowing.  Negative for abdominal pain, constipation, diarrhea, heartburn, nausea and vomiting.  Genitourinary: Negative for dysuria and hematuria.  Musculoskeletal: Negative for back pain and joint pain.  Skin: Negative for rash.  Neurological: Negative for sensory change, speech change, focal weakness and headaches.  Endo/Heme/Allergies: Does not bruise/bleed easily.  Psychiatric/Behavioral: Negative for depression. The patient is not nervous  DRUG ALLERGIES:   Allergies  Allergen Reactions  . Methotrexate     Other reaction(s): Other (See Comments) Weakness,    VITALS:  Blood pressure 117/69, pulse 96, temperature (!) 97.5 F (36.4 C), temperature source Oral, resp. rate 20, height 5\' 8"  (1.727 m), weight 64.1 kg, SpO2 100 %. PHYSICAL EXAMINATION:  Physical Exam   GENERAL:  78 y.o.-year-old patient lying in the bed with no acute distress.  EYES: Pupils equal, round, reactive to light and accommodation. No scleral icterus. Extraocular muscles intact.  HEENT: Has Dobbhoff tube in place.  Oropharynx and  nasopharynx clear. No oropharyngeal erythema, dry oral mucosa  NECK:  Supple, no jugular venous distention. No thyroid enlargement, no tenderness.  LUNGS: Normal breath sounds bilaterally, no wheezing, rales, rhonchi. No use of accessory muscles of respiration.  CARDIOVASCULAR: S1, S2 normal. No murmurs, rubs, or gallops.  ABDOMEN: Soft, nontender, nondistended. Bowel sounds present.   EXTREMITIES: No pedal edema, cyanosis, or clubbing. + 2 pedal & radial pulses b/l.   NEUROLOGIC: Cranial nerves II through XII are intact. No focal Motor or sensory deficits appreciated b/l PSYCHIATRIC: The patient is alert and oriented x 3.   Moving all extremities with no focal deficit SKIN: No obvious rash, lesion, or ulcer.   LABORATORY PANEL:  Male CBC Recent Labs  Lab 06/28/19 0624  WBC 7.7  HGB 7.6*  HCT 25.5*  PLT 543*   ------------------------------------------------------------------------------------------------------------------ Chemistries  Recent Labs  Lab 06/24/19 1400  06/28/19 0624  NA 134*   < > 133*  K 4.1   < > 3.1*  CL 93*   < > 98  CO2 27   < > 30  GLUCOSE 122*   < > 178*  BUN 14   < > 5*  CREATININE 0.54*   < > <0.30*  CALCIUM 8.4*   < > 7.5*  MG  --    < > 1.7  AST 25  --   --   ALT 13  --   --   ALKPHOS 126  --   --   BILITOT 1.4*  --   --    < > = values in this interval not displayed.   RADIOLOGY:  No results found. ASSESSMENT AND PLAN:   *  Dysphagia to solids. -Unclear etiology  Tolerating thin liquids.  Advancing to dysphagia 2 diet On further review of the chart patient noted to have had CT abdomen and pelvis and chest on 05/25/2019.  This morning patient reported the symptoms of dysphagia started within the last 1 to 2 weeks after the last CT scan. Since etiology of dysphagia not yet found, I have requested for repeat imaging with CT abdomen and pelvis with chest today. Patient seen by gastroenterologist and status post EGD with Dobbhoff tube placement on  06/26/2019.  Tube feeding initiated yesterday to ensure patient can tolerate tube feeds before placement of PEG tube placement. Patient's daughter has reported that patient was treated with methotrexate and first week of July by his rheumatologist for rheumatoid arthritis and she states that patient's overall clinical condition has been declining since then.  Patient however stated to me the difficulty with swallowing started within the last 2 weeks or so. Follow-up with dietitian and advance diet as tolerated MiraLAX is added to the regimen Reglan as needed prior to the meal  *Dehydration due to decreased oral intake.    Continue IV fluids decreasing to 50 mL/h  *Interstitial lung disease/emphysema.  Not on oxygen.  No significant shortness of breath.  Nebulizers as needed  *Seropositive rheumatoid arthritis.  Not on treatment at this time.  Discontinued methotrexate  *Severe protein calorie malnutrition.  Protein supplement ordered.  Dietitian is following  *Anemia Likely due to anemia of chronic disease. Follow-up CBC-hemoglobin at 7.4 MCV 80.3 which is normocytic Iron studies done with iron saturation of 12% suggestive of iron deficiency anemia.  Started on ferrous sulfate  Seen by palliative care recommending outpatient palliative care follow-up  DVT prophylaxis ; Lovenox discontinued previously due to drop in hemoglobin. Ordered SCDs   All the records are reviewed and case discussed with Care Management/Social Worker. Management plans discussed with the patient, family and they are in agreement. I updated patient's wife at bedside on treatment plans.  All questions were answered.  She is in agreement with the plan of care as outlined above. I Was Notified by Case Manager primary care physician and patient will begin discussion about setting up home health with palliative care on discharge from the hospital.  CODE STATUS: DNR  TOTAL TIME TAKING CARE OF THIS PATIENT: 36  minutes.   More than 50% of the time was spent in counseling/coordination of care: YES  POSSIBLE D/C IN 2 DAYS, DEPENDING ON CLINICAL CONDITION.   Nicholes Mango M.D on 06/28/2019 at 2:00 PM  Between 7am to 6pm - Pager - [phone]  After 6pm go to [email or web address] - Proofreader  [facility]  Office  [phone]  CC: Primary care physician; Cyndie Chime, MD  Note: This dictation was prepared with Dragon dictation along with smaller phrase technology. Any transcriptional errors that result from this process are unintentional.

## 2019-06-28 NOTE — Progress Notes (Signed)
[name] Foster , MD [address] [place], [place], [place], [address] [address], [place], [place], [place], [address] Phone: [phone]  Fax: [phone]  [name] Foster is being followed for inability to eat   Subjective: Not had a bowel movement in 9 days .Feels full and is not able to eat because he says the tube feeding fills his stomach    Objective: Vital signs in last 24 hours: Vitals:   06/27/19 0418 06/27/19 1951 06/28/19 0423 06/28/19 0431  BP:  125/65  (!) 118/59  Pulse:  94  92  Resp:  20  16  Temp:  97.9 F (36.6 C)  98.1 F (36.7 C)  TempSrc:  Oral  Oral  SpO2:  100%  100%  Weight: 62.8 kg  64.1 kg   Height:       Weight change: 1.3 kg  Intake/Output Summary (Last 24 hours) at 06/28/2019 1113 Last data filed at 06/28/2019 1056 Gross per 24 hour  Intake 2182.76 ml  Output 1225 ml  Net 957.76 ml     Exam: Heart:: Regular rate and rhythm, S1S2 present or without murmur or extra heart sounds Lungs: normal, clear to auscultation and clear to auscultation and percussion Abdomen: soft, nontender, normal bowel sounds   Lab Results: @LABTEST2 @ Micro Results: Recent Results (from the past 240 hour(s))  SARS CORONAVIRUS 2 Nasal Swab Aptima Multi Swab     Status: None   Collection Time: 06/24/19  6:48 PM   Specimen: Aptima Multi Swab; Nasal Swab  Result Value Ref Range Status   SARS Coronavirus 2 NEGATIVE NEGATIVE Final    Comment: (NOTE) SARS-CoV-2 target nucleic acids are NOT DETECTED. The SARS-CoV-2 RNA is generally detectable in upper and lower respiratory specimens during the acute phase of infection. Negative results do not preclude SARS-CoV-2 infection, do not rule out co-infections with other pathogens, and should not be used as the sole basis for treatment or other patient management decisions. Negative results must be combined with clinical observations, patient history, and epidemiological information. The expected result is  Negative. Fact Sheet for Patients: SugarRoll.be Fact Sheet for Healthcare Providers: https://www.woods-mathews.com/ This test is not yet approved or cleared by the [place] FDA and  has been authorized for detection and/or diagnosis of SARS-CoV-2 by FDA under an Emergency Use Authorization (EUA). This EUA will remain  in effect (meaning this test can be used) for the duration of the COVID-19 declaration under Section 56 4(b)(1) of the Act, 21 U.S.C. section 360bbb-3(b)(1), unless the authorization is terminated or revoked sooner. Performed at [facility], [place] [address], [place], [place] [address]    Studies/Results: Ct Chest W Contrast  Result Date: 06/27/2019 CLINICAL DATA:  Dysphagia unexplained EXAM: CT CHEST, ABDOMEN, AND PELVIS WITH CONTRAST TECHNIQUE: Multidetector CT imaging of the chest, abdomen and pelvis was performed following the standard protocol during bolus administration of intravenous contrast. CONTRAST:  129mL OMNIPAQUE IOHEXOL 300 MG/ML  SOLN COMPARISON:  05/25/2019 FINDINGS: CT CHEST FINDINGS Cardiovascular: Tortuous aorta. Heart is normal size. Aorta is normal caliber. Mediastinum/Nodes: No mediastinal, hilar, or axillary adenopathy. Trachea and esophagus are unremarkable. Feeding tube noted within the esophagus. Lungs/Pleura: Peripheral ground-glass opacities and reticulation in the right lung improved since prior study. Minimal residual peripheral ground-glass densities remain. Dependent bibasilar atelectasis. Mild airway thickening. No effusions. Musculoskeletal: Chest wall soft tissues are unremarkable. No acute bony abnormality CT ABDOMEN PELVIS FINDINGS Hepatobiliary: No focal hepatic abnormality. Gallbladder unremarkable. Pancreas: No focal abnormality or ductal dilatation.  Spleen: No focal abnormality.  Normal size. Adrenals/Urinary Tract: Small nodule in the left adrenal gland is stable. No suspicious mass in the  right adrenal gland or kidneys. 6 mm nephrolithiasis in the midpole of the left kidney. No hydronephrosis or ureteral stones. Urinary bladder decompressed, grossly unremarkable. Stomach/Bowel: Feeding tube is in place with the tip in the distal duodenum near the ligament of Treitz. No evidence of bowel obstruction. Moderate stool throughout the colon. Lower sigmoid colon and rectum obscured by beam hardening artifact from bilateral hip replacements. Vascular/Lymphatic: Tortuous aorta with scattered calcifications. No aneurysm or dissection. No adenopathy. Reproductive: Prostate enlargement with a transverse diameter of 5 cm. Other: No free fluid or free air. Musculoskeletal: Bilateral hip replacements. Degenerative changes in the lumbar spine. No acute or focal bony abnormality. IMPRESSION: Peripheral ground-glass and reticular opacities in the right lung have improved since prior study. Minimal residual peripheral opacities. Dependent bibasilar atelectasis. Aortic atherosclerosis and tortuosity. Left nephrolithiasis is unchanged.  No hydronephrosis. Feeding tube tip in the distal duodenum near the ligament of Treitz. Moderate stool burden throughout the colon. No acute findings in the abdomen or pelvis. Electronically Signed   By: Rolm Baptise M.D.   On: 06/27/2019 13:26   Ct Abdomen Pelvis W Contrast  Result Date: 06/27/2019 CLINICAL DATA:  Dysphagia unexplained EXAM: CT CHEST, ABDOMEN, AND PELVIS WITH CONTRAST TECHNIQUE: Multidetector CT imaging of the chest, abdomen and pelvis was performed following the standard protocol during bolus administration of intravenous contrast. CONTRAST:  123mL OMNIPAQUE IOHEXOL 300 MG/ML  SOLN COMPARISON:  05/25/2019 FINDINGS: CT CHEST FINDINGS Cardiovascular: Tortuous aorta. Heart is normal size. Aorta is normal caliber. Mediastinum/Nodes: No mediastinal, hilar, or axillary adenopathy. Trachea and esophagus are unremarkable. Feeding tube noted within the esophagus.  Lungs/Pleura: Peripheral ground-glass opacities and reticulation in the right lung improved since prior study. Minimal residual peripheral ground-glass densities remain. Dependent bibasilar atelectasis. Mild airway thickening. No effusions. Musculoskeletal: Chest wall soft tissues are unremarkable. No acute bony abnormality CT ABDOMEN PELVIS FINDINGS Hepatobiliary: No focal hepatic abnormality. Gallbladder unremarkable. Pancreas: No focal abnormality or ductal dilatation. Spleen: No focal abnormality.  Normal size. Adrenals/Urinary Tract: Small nodule in the left adrenal gland is stable. No suspicious mass in the right adrenal gland or kidneys. 6 mm nephrolithiasis in the midpole of the left kidney. No hydronephrosis or ureteral stones. Urinary bladder decompressed, grossly unremarkable. Stomach/Bowel: Feeding tube is in place with the tip in the distal duodenum near the ligament of Treitz. No evidence of bowel obstruction. Moderate stool throughout the colon. Lower sigmoid colon and rectum obscured by beam hardening artifact from bilateral hip replacements. Vascular/Lymphatic: Tortuous aorta with scattered calcifications. No aneurysm or dissection. No adenopathy. Reproductive: Prostate enlargement with a transverse diameter of 5 cm. Other: No free fluid or free air. Musculoskeletal: Bilateral hip replacements. Degenerative changes in the lumbar spine. No acute or focal bony abnormality. IMPRESSION: Peripheral ground-glass and reticular opacities in the right lung have improved since prior study. Minimal residual peripheral opacities. Dependent bibasilar atelectasis. Aortic atherosclerosis and tortuosity. Left nephrolithiasis is unchanged.  No hydronephrosis. Feeding tube tip in the distal duodenum near the ligament of Treitz. Moderate stool burden throughout the colon. No acute findings in the abdomen or pelvis. Electronically Signed   By: Rolm Baptise M.D.   On: 06/27/2019 13:26   Medications: I have reviewed  the patient's current medications. Scheduled Meds:  docusate  100 mg Per Tube BID   doxazosin  4 mg Oral Daily   feeding supplement  1 Container Oral TID BM   feeding supplement (PRO-STAT SUGAR FREE 64)  30 mL Per Tube BID   ferrous sulfate  220 mg Per Tube BID WC   finasteride  5 mg Oral Daily   free water  115 mL Per Tube Q4H   mouth rinse  15 mL Mouth Rinse BID   mometasone-formoterol  2 puff Inhalation BID   OLANZapine  2.5 mg Oral QHS   pantoprazole  40 mg Oral Daily   Continuous Infusions:  sodium chloride Stopped (06/26/19 0905)   sodium chloride 100 mL/hr at 06/28/19 0913   feeding supplement (OSMOLITE 1.5 CAL) 1,000 mL (06/28/19 0233)   magnesium sulfate bolus IVPB     potassium PHOSPHATE IVPB (in mmol)     PRN Meds:.sodium chloride, acetaminophen **OR** acetaminophen, albuterol, ondansetron **OR** ondansetron (ZOFRAN) IV, polyethylene glycol   Assessment: Active Problems:   Dysphagia   Protein-calorie malnutrition, severe  [name] Foster 78 y.o. male underwent hip replacement on June 6.  Acutely developed what appeared of 2 weeks loss of appetite weight loss.  Admitted to [facility] for pneumonia resulting in sepsis.  Refusing to eat anything but only drinking liquids. EGD performed on 06/26/2019 which showed no gross abnormalities to contribute towards the weight loss.  Feeding tube placed endoscopically.  Iron levels are low on iron studies.  He denies any dysphagia.  His main issue is that he states that the food does not feel and taste right in his mouth.  It probably may be related to the methotrexate which she clearly states changed the way he started to eat and drink once he was commenced in June.  It has since been stopped.  Plan: 1.  Daily miralax , enema today  2. He feels full with the tube feeding and hence he feels he cannot eat , suggest to commence tube feedings in the evening or later in the day so that the stomach is empty and he can try to  eat some food orally , advance diet to a pureed diet . If needed can also add reglan PRN    LOS: 4 days   [name] Bellows, MD 06/28/2019, 11:13 AM

## 2019-06-28 NOTE — Progress Notes (Signed)
PT Cancellation Note  Patient Details Name: [name] Foster MRN: [id number] DOB: [birth year]   Cancelled Treatment:    Reason Eval/Treat Not Completed: Patient declined, no reason specified Chart reviewed. PT attempted this AM at 0847. Pt supine at bed and refused PT session due to fatigue. Pt states will try to work with PT this PM. PT will re-attempt at later time/date as able.    Sherrilyn Rist, SPT 06/28/2019, 8:53 AM

## 2019-06-29 ENCOUNTER — Ambulatory Visit: Payer: Medicare HMO | Admitting: Oncology

## 2019-06-29 LAB — POTASSIUM: Potassium: 3.5 mmol/L (ref 3.5–5.1)

## 2019-06-29 LAB — GLUCOSE, CAPILLARY
Glucose-Capillary: 144 mg/dL — ABNORMAL HIGH (ref 70–99)
Glucose-Capillary: 148 mg/dL — ABNORMAL HIGH (ref 70–99)
Glucose-Capillary: 156 mg/dL — ABNORMAL HIGH (ref 70–99)
Glucose-Capillary: 86 mg/dL (ref 70–99)

## 2019-06-29 LAB — PHOSPHORUS: Phosphorus: 2.8 mg/dL (ref 2.5–4.6)

## 2019-06-29 MED ORDER — POLYETHYLENE GLYCOL 3350 17 G PO PACK: 17.0000 g | PACK | Freq: Every day | ORAL | 0 refills | Status: DC | PRN

## 2019-06-29 MED ORDER — PRO-STAT SUGAR FREE PO LIQD: 30.0000 mL | Freq: Two times a day (BID) | ORAL | Status: DC

## 2019-06-29 MED ORDER — ENSURE ENLIVE PO LIQD: 237.0000 mL | Freq: Two times a day (BID) | ORAL | Status: DC

## 2019-06-29 MED ORDER — ENSURE ENLIVE PO LIQD: 237.0000 mL | Freq: Two times a day (BID) | ORAL | 1 refills | Status: DC

## 2019-06-29 MED ORDER — DOCUSATE SODIUM 100 MG PO CAPS: 100.0000 mg | ORAL_CAPSULE | Freq: Every day | ORAL | 0 refills | Status: DC

## 2019-06-29 MED ORDER — FREE WATER: 75.0000 mL | Status: DC

## 2019-06-29 MED ORDER — ADULT MULTIVITAMIN W/MINERALS CH: 1.0000 | ORAL_TABLET | Freq: Every day | ORAL | Status: AC

## 2019-06-29 MED ORDER — PANTOPRAZOLE SODIUM 40 MG PO TBEC: 40.0000 mg | DELAYED_RELEASE_TABLET | Freq: Every day | ORAL | 1 refills | Status: DC

## 2019-06-29 MED ORDER — METOCLOPRAMIDE HCL 5 MG PO TABS: 5.0000 mg | ORAL_TABLET | Freq: Three times a day (TID) | ORAL | 0 refills | Status: DC | PRN

## 2019-06-29 MED ORDER — ADULT MULTIVITAMIN LIQUID CH: 15.0000 mL | Freq: Every day | ORAL | Status: DC

## 2019-06-29 MED ORDER — OLANZAPINE 2.5 MG PO TABS: 2.5000 mg | ORAL_TABLET | Freq: Every day | ORAL | 0 refills | Status: DC

## 2019-06-29 MED ORDER — OSMOLITE 1.5 CAL PO LIQD: 720.0000 mL | ORAL | Status: DC

## 2019-06-29 MED ORDER — ADULT MULTIVITAMIN W/MINERALS CH: 1.0000 | ORAL_TABLET | Freq: Every day | ORAL | Status: DC

## 2019-06-29 NOTE — Discharge Summary (Signed)
[facility] [place] NAME: [name] Foster    MR#:  [id number]  DATE OF BIRTH:  May 03, 1941  DATE OF ADMISSION:  06/24/2019 ADMITTING PHYSICIAN: Hillary Bow, MD  DATE OF DISCHARGE: 06/29/19   PRIMARY CARE PHYSICIAN: Cyndie Chime, MD    ADMISSION DIAGNOSIS:  Dysphagia [R13.10] Dysphagia, unspecified type [R13.10]  DISCHARGE DIAGNOSIS:  Active Problems:   Dysphagia   Protein-calorie malnutrition, severe   SECONDARY DIAGNOSIS:   Past Medical History:  Diagnosis Date  . Arthritis   . BPH (benign prostatic hyperplasia)   . Former smoker, stopped smoking many years ago   . History of needle biopsy of prostate with negative result 2013  . Hypertension   . Prostate enlargement   . Prostatic intraepithelial neoplasm III 2013    HOSPITAL COURSE:   [name] Foster  is a 78 y.o. male with a known history of rheumatoid arthritis, emphysema, interstitial lung disease presents to the emergency room brought in by family due to weakness, dysphagia not tolerating solids.  Patient tolerated thin liquids.  He has loss 50 pounds in the last few months.  Worsening weakness and is unable to walk at this time. He does have some nausea but controlled with Zofran.  Does not complain of any odynophagia.  No abdominal pain.  Patient has been following up with oncology Dr. Tasia Catchings and had a bone marrow biopsy.  No malignancy found.  Dr. Tasia Catchings referred to emergency room for further work-up and possible PEG tube placement. Afebrile.  During prior admission patient had started methotrexate and received 2 doses.  Family believes his multiple problems are secondary to methotrexate.  This was stopped.   *Dysphagia to solids. -Unclear etiology Tolerating thin liquids.  Advancing to dysphagia 2 diet On further review of the chart patient noted to have had CT abdomen and pelvis and chest on 05/25/2019.  This morning patient reported the symptoms of  dysphagia started within the last 1 to 2 weeks after the last CT scan. Since etiology of dysphagia not yet found, I have requested for repeat imaging with CT abdomen and pelvis with chest today. Patient seen by gastroenterologist and status post EGD with Dobbhoff tube placement on 06/26/2019.  Tube feeding initiated yesterday to ensure patient can tolerate tube feeds before placement of PEG tube placement. Patient's daughter has reported that patient was treated with methotrexate and first week of July by his rheumatologist for rheumatoid arthritis and she states that patient's overall clinical condition has been declining since then.  Patient however stated to me the difficulty with swallowing started within the last 2 weeks or so. Follow-up with dietitian and advance diet as tolerated MiraLAX is added to the regimen Reglan as needed prior to the meal  *Dehydration due to decreased oral intake.   Continue IV fluids decreasing to 50 mL/h  *Interstitial lung disease/emphysema. Not on oxygen. No significant shortness of breath. Nebulizers as needed  *Seropositive rheumatoid arthritis. Not on treatment at this time.  Discontinued methotrexate  *Severe protein calorie malnutrition. Protein supplement ordered.  Dietitian is following  *Anemia Likely due to anemia of chronic disease. Follow-up CBC-hemoglobin at 7.4 MCV 80.3 which is normocytic Iron studies done with iron saturation of 12% suggestive of iron deficiency anemia.  Started on ferrous sulfate  Seen by palliative care recommending outpatient palliative care follow-up  DVT prophylaxis ; Lovenox discontinued previously due to drop in hemoglobin. Ordered SCDs  DISCHARGE CONDITIONS:   Fair   CONSULTS OBTAINED:  Treatment Team:  Lin Landsman, MD Jonathon Bellows, MD   PROCEDURES dobhoff tube placement   DRUG ALLERGIES:   Allergies  Allergen Reactions  . Methotrexate     Other reaction(s): Other (See  Comments) Weakness,     DISCHARGE MEDICATIONS:   Allergies as of 06/29/2019      Reactions   Methotrexate    Other reaction(s): Other (See Comments) Weakness,       Medication List    TAKE these medications   albuterol 108 (90 Base) MCG/ACT inhaler Commonly known as: VENTOLIN HFA Inhale 2 puffs into the lungs every 6 (six) hours as needed.   docusate sodium 100 MG capsule Commonly known as: Colace Take 1 capsule (100 mg total) by mouth daily.   doxazosin 4 MG tablet Commonly known as: CARDURA Take 1 tablet (4 mg total) by mouth daily.   feeding supplement (ENSURE ENLIVE) Liqd Take 237 mLs by mouth 2 (two) times daily between meals. Start taking on: June 30, 2019   finasteride 5 MG tablet Commonly known as: PROSCAR Take 1 tablet (5 mg total) by mouth daily.   Fluticasone-Salmeterol 250-50 MCG/DOSE Aepb Commonly known as: ADVAIR Inhale 1 puff into the lungs 2 (two) times a day.   metoCLOPramide 5 MG tablet Commonly known as: Reglan Take 1 tablet (5 mg total) by mouth 3 (three) times daily with meals as needed for nausea or vomiting.   multivitamin with minerals Tabs tablet Take 1 tablet by mouth daily. Start taking on: June 30, 2019   OLANZapine 2.5 MG tablet Commonly known as: ZYPREXA Take 1 tablet (2.5 mg total) by mouth at bedtime.   pantoprazole 40 MG tablet Commonly known as: PROTONIX Take 1 tablet (40 mg total) by mouth daily. Start taking on: June 30, 2019   polyethylene glycol 17 g packet Commonly known as: MIRALAX / GLYCOLAX Take 17 g by mouth daily as needed for mild constipation.        DISCHARGE INSTRUCTIONS:   Follow-up with primary care physician in 3 days Follow-up with primary rheumatology in 1 week Follow-up with oncology Dr. Tasia Catchings in 10 days Follow follow-up with outpatient dietitian in a week Follow-up with gastroenterology Dr. Vicente Males in a week Outpatient palliative care DIET:   Diet recommended at Discharge:  Mech Soft  foods (meats cut/Minced w/ Gravy added); Thin liquids. Add Soups at meals; condiments and gravies to moisten foods also. Pills in Puree. Dietician Consult to establish best meal/food plan for him.        DISCHARGE CONDITION:  Fair  ACTIVITY:  Activity as tolerated  OXYGEN:  Home Oxygen: No.   Oxygen Delivery: room air  DISCHARGE LOCATION:  home   If you experience worsening of your admission symptoms, develop shortness of breath, life threatening emergency, suicidal or homicidal thoughts you must seek medical attention immediately by calling 911 or calling your MD immediately  if symptoms less severe.  You Must read complete instructions/literature along with all the possible adverse reactions/side effects for all the Medicines you take and that have been prescribed to you. Take any new Medicines after you have completely understood and accpet all the possible adverse reactions/side effects.   Please note  You were cared for by a hospitalist during your hospital stay. If you have any questions about your discharge medications or the care you received while you were in the hospital after you are discharged, you can call the unit and asked to speak with the hospitalist on call if the hospitalist  that took care of you is not available. Once you are discharged, your primary care physician will handle any further medical issues. Please note that NO REFILLS for any discharge medications will be authorized once you are discharged, as it is imperative that you return to your primary care physician (or establish a relationship with a primary care physician if you do not have one) for your aftercare needs so that they can reassess your need for medications and monitor your lab values.     Today  Chief Complaint  Patient presents with  . Feeding tube placement   Patient is tolerating modified dysphagia diet.  Denies any nausea vomiting or abdominal pain.  No coughing while eating.  Seen and  reassessed by speech therapy and Dobbhoff has removed.  Patient is feeling fine.  Needs regular dietitian follow-up as an outpatient.  Discharging home per patient's request.  Wife is agreeable  ROS:  CONSTITUTIONAL: Denies fevers, chills. Denies any fatigue, weakness.  EYES: Denies blurry vision, double vision, eye pain. EARS, NOSE, THROAT: Denies tinnitus, ear pain, hearing loss. RESPIRATORY: Denies cough, wheeze, shortness of breath.  CARDIOVASCULAR: Denies chest pain, palpitations, edema.  GASTROINTESTINAL: Denies nausea, vomiting, diarrhea, abdominal pain. Denies bright red blood per rectum. GENITOURINARY: Denies dysuria, hematuria. ENDOCRINE: Denies nocturia or thyroid problems. HEMATOLOGIC AND LYMPHATIC: Denies easy bruising or bleeding. SKIN: Denies rash or lesion. MUSCULOSKELETAL: Denies pain in neck, back, shoulder, knees, hips or arthritic symptoms.  NEUROLOGIC: Denies paralysis, paresthesias.  PSYCHIATRIC: Denies anxiety or depressive symptoms.   VITAL SIGNS:  Blood pressure 102/67, pulse (!) 105, temperature 98.2 F (36.8 C), temperature source Oral, resp. rate 18, height _0  (1.727 m), weight 64.1 kg, SpO2 100 %.  I/O:    Intake/Output Summary (Last 24 hours) at 06/29/2019 1502 Last data filed at 06/29/2019 1105 Gross per 24 hour  Intake 3147.83 ml  Output 1375 ml  Net 1772.83 ml    PHYSICAL EXAMINATION:  GENERAL:  78 y.o.-year-old patient lying in the bed with no acute distress.  EYES: Pupils equal, round, reactive to light and accommodation. No scleral icterus. Extraocular muscles intact.  HEENT: Head atraumatic, normocephalic. Oropharynx and nasopharynx clear.  NECK:  Supple, no jugular venous distention. No thyroid enlargement, no tenderness.  LUNGS: Normal breath sounds bilaterally, no wheezing, rales,rhonchi or crepitation. No use of accessory muscles of respiration.  CARDIOVASCULAR: S1, S2 normal. No murmurs, rubs, or gallops.  ABDOMEN: Soft, non-tender,  non-distended. Bowel sounds present.  EXTREMITIES: No pedal edema, cyanosis, or clubbing.  NEUROLOGIC: Cranial nerves II through XII are intact. Muscle strength 5/5 in all extremities. Sensation intact. Gait not checked.  PSYCHIATRIC: The patient is alert and oriented x 3.  SKIN: No obvious rash, lesion, or ulcer.   DATA REVIEW:   CBC Recent Labs  Lab 06/28/19 0624  WBC 7.7  HGB 7.6*  HCT 25.5*  PLT 543*    Chemistries  Recent Labs  Lab 06/24/19 1400  06/28/19 0624 06/29/19 0417  NA 134*   < > 133*  --   K 4.1   < > 3.1* 3.5  CL 93*   < > 98  --   CO2 27   < > 30  --   GLUCOSE 122*   < > 178*  --   BUN 14   < > 5*  --   CREATININE 0.54*   < > <0.30*  --   CALCIUM 8.4*   < > 7.5*  --   MG  --    < >  1.7  --   AST 25  --   --   --   ALT 13  --   --   --   ALKPHOS 126  --   --   --   BILITOT 1.4*  --   --   --    < > = values in this interval not displayed.    Cardiac Enzymes No results for input(s): TROPONINI in the last 168 hours.  Microbiology Results  Results for orders placed or performed during the hospital encounter of 06/24/19  SARS CORONAVIRUS 2 Nasal Swab Aptima Multi Swab     Status: None   Collection Time: 06/24/19  6:48 PM   Specimen: Aptima Multi Swab; Nasal Swab  Result Value Ref Range Status   SARS Coronavirus 2 NEGATIVE NEGATIVE Final    Comment: (NOTE) SARS-CoV-2 target nucleic acids are NOT DETECTED. The SARS-CoV-2 RNA is generally detectable in upper and lower respiratory specimens during the acute phase of infection. Negative results do not preclude SARS-CoV-2 infection, do not rule out co-infections with other pathogens, and should not be used as the sole basis for treatment or other patient management decisions. Negative results must be combined with clinical observations, patient history, and epidemiological information. The expected result is Negative. Fact Sheet for Patients: SugarRoll.be Fact Sheet for  Healthcare Providers: https://www.woods-mathews.com/ This test is not yet approved or cleared by the [place] FDA and  has been authorized for detection and/or diagnosis of SARS-CoV-2 by FDA under an Emergency Use Authorization (EUA). This EUA will remain  in effect (meaning this test can be used) for the duration of the COVID-19 declaration under Section 56 4(b)(1) of the Act, 21 U.S.C. section 360bbb-3(b)(1), unless the authorization is terminated or revoked sooner. Performed at [facility], [place] [address], [place], [place] [address]     RADIOLOGY:  Ct Chest W Contrast  Result Date: 06/27/2019 CLINICAL DATA:  Dysphagia unexplained EXAM: CT CHEST, ABDOMEN, AND PELVIS WITH CONTRAST TECHNIQUE: Multidetector CT imaging of the chest, abdomen and pelvis was performed following the standard protocol during bolus administration of intravenous contrast. CONTRAST:  176m OMNIPAQUE IOHEXOL 300 MG/ML  SOLN COMPARISON:  05/25/2019 FINDINGS: CT CHEST FINDINGS Cardiovascular: Tortuous aorta. Heart is normal size. Aorta is normal caliber. Mediastinum/Nodes: No mediastinal, hilar, or axillary adenopathy. Trachea and esophagus are unremarkable. Feeding tube noted within the esophagus. Lungs/Pleura: Peripheral ground-glass opacities and reticulation in the right lung improved since prior study. Minimal residual peripheral ground-glass densities remain. Dependent bibasilar atelectasis. Mild airway thickening. No effusions. Musculoskeletal: Chest wall soft tissues are unremarkable. No acute bony abnormality CT ABDOMEN PELVIS FINDINGS Hepatobiliary: No focal hepatic abnormality. Gallbladder unremarkable. Pancreas: No focal abnormality or ductal dilatation. Spleen: No focal abnormality.  Normal size. Adrenals/Urinary Tract: Small nodule in the left adrenal gland is stable. No suspicious mass in the right adrenal gland or kidneys. 6 mm nephrolithiasis in the midpole of the left kidney. No  hydronephrosis or ureteral stones. Urinary bladder decompressed, grossly unremarkable. Stomach/Bowel: Feeding tube is in place with the tip in the distal duodenum near the ligament of Treitz. No evidence of bowel obstruction. Moderate stool throughout the colon. Lower sigmoid colon and rectum obscured by beam hardening artifact from bilateral hip replacements. Vascular/Lymphatic: Tortuous aorta with scattered calcifications. No aneurysm or dissection. No adenopathy. Reproductive: Prostate enlargement with a transverse diameter of 5 cm. Other: No free fluid or free air. Musculoskeletal: Bilateral hip replacements. Degenerative changes in the lumbar spine. No acute or focal bony  abnormality. IMPRESSION: Peripheral ground-glass and reticular opacities in the right lung have improved since prior study. Minimal residual peripheral opacities. Dependent bibasilar atelectasis. Aortic atherosclerosis and tortuosity. Left nephrolithiasis is unchanged.  No hydronephrosis. Feeding tube tip in the distal duodenum near the ligament of Treitz. Moderate stool burden throughout the colon. No acute findings in the abdomen or pelvis. Electronically Signed   By: Rolm Baptise M.D.   On: 06/27/2019 13:26   Ct Abdomen Pelvis W Contrast  Result Date: 06/27/2019 CLINICAL DATA:  Dysphagia unexplained EXAM: CT CHEST, ABDOMEN, AND PELVIS WITH CONTRAST TECHNIQUE: Multidetector CT imaging of the chest, abdomen and pelvis was performed following the standard protocol during bolus administration of intravenous contrast. CONTRAST:  156m OMNIPAQUE IOHEXOL 300 MG/ML  SOLN COMPARISON:  05/25/2019 FINDINGS: CT CHEST FINDINGS Cardiovascular: Tortuous aorta. Heart is normal size. Aorta is normal caliber. Mediastinum/Nodes: No mediastinal, hilar, or axillary adenopathy. Trachea and esophagus are unremarkable. Feeding tube noted within the esophagus. Lungs/Pleura: Peripheral ground-glass opacities and reticulation in the right lung improved since  prior study. Minimal residual peripheral ground-glass densities remain. Dependent bibasilar atelectasis. Mild airway thickening. No effusions. Musculoskeletal: Chest wall soft tissues are unremarkable. No acute bony abnormality CT ABDOMEN PELVIS FINDINGS Hepatobiliary: No focal hepatic abnormality. Gallbladder unremarkable. Pancreas: No focal abnormality or ductal dilatation. Spleen: No focal abnormality.  Normal size. Adrenals/Urinary Tract: Small nodule in the left adrenal gland is stable. No suspicious mass in the right adrenal gland or kidneys. 6 mm nephrolithiasis in the midpole of the left kidney. No hydronephrosis or ureteral stones. Urinary bladder decompressed, grossly unremarkable. Stomach/Bowel: Feeding tube is in place with the tip in the distal duodenum near the ligament of Treitz. No evidence of bowel obstruction. Moderate stool throughout the colon. Lower sigmoid colon and rectum obscured by beam hardening artifact from bilateral hip replacements. Vascular/Lymphatic: Tortuous aorta with scattered calcifications. No aneurysm or dissection. No adenopathy. Reproductive: Prostate enlargement with a transverse diameter of 5 cm. Other: No free fluid or free air. Musculoskeletal: Bilateral hip replacements. Degenerative changes in the lumbar spine. No acute or focal bony abnormality. IMPRESSION: Peripheral ground-glass and reticular opacities in the right lung have improved since prior study. Minimal residual peripheral opacities. Dependent bibasilar atelectasis. Aortic atherosclerosis and tortuosity. Left nephrolithiasis is unchanged.  No hydronephrosis. Feeding tube tip in the distal duodenum near the ligament of Treitz. Moderate stool burden throughout the colon. No acute findings in the abdomen or pelvis. Electronically Signed   By: KRolm BaptiseM.D.   On: 06/27/2019 13:26    EKG:   Orders placed or performed during the hospital encounter of 06/24/19  . ED EKG  . ED EKG      Management plans  discussed with the patient, wife at bed side  and they are in agreement.  CODE STATUS:     Code Status Orders  (From admission, onward)         Start     Ordered   06/24/19 1942  Do not attempt resuscitation (DNR)  Continuous    Question Answer Comment  In the event of cardiac or respiratory ARREST Do not call a "code blue"   In the event of cardiac or respiratory ARREST Do not perform Intubation, CPR, defibrillation or ACLS   In the event of cardiac or respiratory ARREST Use medication by any route, position, wound care, and other measures to relive pain and suffering. May use oxygen, suction and manual treatment of airway obstruction as needed for comfort.  06/24/19 1943        Code Status History    Date Active Date Inactive Code Status Order ID Comments User Context   05/25/2019 0958 05/27/2019 1724 Full Code [id number]  Max Sane, MD Inpatient   04/26/2019 1243 04/28/2019 1421 Full Code [id number]  Hessie Knows, MD Inpatient   Advance Care Planning Activity      TOTAL TIME TAKING CARE OF THIS PATIENT: 45 minutes.   Note: This dictation was prepared with Dragon dictation along with smaller phrase technology. Any transcriptional errors that result from this process are unintentional.   _0 @  on 06/29/2019 at 3:02 PM  Between 7am to 6pm - Pager - [phone]  After 6pm go to [email or web address] - password [email or web address] [facility]  Office  [phone]  CC: Primary care physician; Cyndie Chime, MD

## 2019-06-29 NOTE — Progress Notes (Signed)
Nutrition Follow Up Note   DOCUMENTATION CODES:   Severe malnutrition in context of chronic illness  INTERVENTION:   Ensure Enlive po BID, each supplement provides 350 kcal and 20 grams of protein  Magic cup TID with meals, each supplement provides 290 kcal and 9 grams of protein  MVI daily   NUTRITION DIAGNOSIS:   Severe Malnutrition related to chronic illness(dysphagia, inadequate oral intake) as evidenced by energy intake < or equal to 75% for > or equal to 1 month, severe fat depletion, moderate-severe muscle depletion, 28.3% weight loss over the past 11 months (most of that weight loss occurring in the past 3 months).  GOAL:   Patient will meet greater than or equal to 90% of their needs  -previously met with tube feeds   MONITOR:   PO intake, Supplement acceptance, Labs, Weight trends, Skin, I & O's  ASSESSMENT:   78 year old male with PMHx of BPH, arthritis, HTN, interstitial lung disease/emphysema, seropositive rheumatoid arthritis admitted with dysphagia to solids, dehydration.   Pt advanced to a dysphagia 3 diet. Pt ate 100% of his breakfast this morning. NGT removed. RD will add supplements and vitamins to help pt meet his estimated needs.   Medications reviewed and include: colace, protonix, miralax, NaCl @50ml /hr  Labs reviewed: Na 133(L), K 3.5 wnl, BUN 5(L), creat <0.30(L), P 2.8 wnl, Mg 1.7 wnl Prealbumin <5(L)- 8/16 Hgb 7.6(L), Hct 25.5(L), MCH 24.1(L), MCHC 29.8(L)  Diet Order:   Diet Order            DIET DYS 3 Room service appropriate? Yes with Assist; Fluid consistency: Thin  Diet effective now             EDUCATION NEEDS:   No education needs have been identified at this time  Skin:  Skin Assessment: Reviewed RN Assessment  Last BM:  8/18  Height:   Ht Readings from Last 1 Encounters:  06/24/19 5' 8"  (1.727 m)   Weight:   Wt Readings from Last 1 Encounters:  06/28/19 64.1 kg   Ideal Body Weight:  70 kg  BMI:  Body mass index is  21.49 kg/m.  Estimated Nutritional Needs:   Kcal:  1545-1800 (MSJ x 1.2-1.4)  Protein:  85-100 grams (1.5-1.7 grams/kg)  Fluid:  1.5-1.8 L/day  Koleen Distance MS, RD, LDN Pager #- [phone] Office#- [phone] After Hours Pager: [phone]

## 2019-06-29 NOTE — Progress Notes (Signed)
Physical Therapy Treatment Patient Details Name: [name] Foster MRN: [id number] DOB: 1940/12/02 Today's Date: 06/29/2019    History of Present Illness Pt is a 78 y.o. male presenting to hospital 06/24/19 d/t concerns with difficulty eating/drinking and increased weakness (unable to walk); 50# weight loss last few months; question about feeding tube placement.  Pt admitted with dysphagia to solids and dehydration.  PMH includes R THA 04/26/19 (Dr. Rudene Christians) WBAT, h/o L THA, BPH, htn, iron deficiency anemia, autoimmune neutropenia, RA, emphysema.    PT Comments    Upon PT arrival to pt's room, pt sitting in chair and requesting back to bed; pt's wife present.  Therapist encouraged pt to attempt ambulation with chair follow but pt firmly refusing (pt's wife attempted to encourage pt to ambulate but pt continued to refuse).  Min assist to stand from recliner and CGA to walk short distance (about 5 feet) recliner to bed with RW.  Pt tolerated sitting ex's and semi-supine ex's in bed fairly well (AAROM to AROM B LE's per pt tolerance).  Will continue to focus on strengthening and progressive functional mobility per pt tolerance.    Follow Up Recommendations  Home health PT;Supervision/Assistance - 24 hour     Equipment Recommendations  Wheelchair (measurements PT);Wheelchair cushion (measurements PT)(lightweight wheelchair)    Recommendations for Other Services       Precautions / Restrictions Precautions Precautions: Fall Restrictions Weight Bearing Restrictions: No    Mobility  Bed Mobility Overal bed mobility: Needs Assistance Bed Mobility: Sit to Supine       Sit to supine: Mod assist   General bed mobility comments: assist for B LE's sit to semi-supine  Transfers Overall transfer level: Needs assistance Equipment used: Rolling walker (2 wheeled) Transfers: Sit to/from Stand Sit to Stand: Min assist         General transfer comment: min assist to stand from recliner up to  RW  Ambulation/Gait Ambulation/Gait assistance: Min guard Gait Distance (Feet): 5 Feet(recliner to bed) Assistive device: Rolling walker (2 wheeled)   Gait velocity: decreased   General Gait Details: decreased B step length/foot clearance/heelstrike; no knee buckling noted   Stairs             Wheelchair Mobility    Modified Rankin (Stroke Patients Only)       Balance Overall balance assessment: Needs assistance Sitting-balance support: No upper extremity supported;Feet supported Sitting balance-Leahy Scale: Good Sitting balance - Comments: steady sitting reaching within BOS     Standing balance-Leahy Scale: Poor Standing balance comment: pt requiring at least single UE spport for static standing balance                            Cognition Arousal/Alertness: Awake/alert Behavior During Therapy: Flat affect Overall Cognitive Status: Difficult to assess                                 General Comments: Pt not very talkative during session and tended to mostly not look at therapist      Exercises General Exercises - Lower Extremity Short Arc Quad: AAROM;Strengthening;Both;10 reps;Supine Long Arc Quad: AROM;Strengthening;Both;15 reps;Seated Heel Slides: AROM;Strengthening;Both;10 reps;Supine Hip ABduction/ADduction: AAROM;Strengthening;Both;10 reps;Supine Hip Flexion/Marching: AROM;Strengthening;Both;15 reps;Seated    General Comments  Pt agreeable to getting back to bed and ex's.      Pertinent Vitals/Pain Pain Assessment: Faces Faces Pain Scale: Hurts a little bit Pain  Location: back Pain Descriptors / Indicators: Sore Pain Intervention(s): Limited activity within patient's tolerance;Monitored during session;Repositioned  Vitals stable and WFL throughout treatment session.    Home Living                      Prior Function            PT Goals (current goals can now be found in the care plan section) Acute Rehab  PT Goals Patient Stated Goal: to improve strength and mobility PT Goal Formulation: With patient/family Time For Goal Achievement: 07/09/19 Potential to Achieve Goals: Fair Additional Goals Additional Goal #1: Pt able to self propel lightweight wheelchair 100 feet safely. Progress towards PT goals: Progressing toward goals    Frequency    Min 2X/week      PT Plan Current plan remains appropriate    Co-evaluation              AM-PAC PT "6 Clicks" Mobility   Outcome Measure  Help needed turning from your back to your side while in a flat bed without using bedrails?: A Little Help needed moving from lying on your back to sitting on the side of a flat bed without using bedrails?: A Lot Help needed moving to and from a bed to a chair (including a wheelchair)?: A Little Help needed standing up from a chair using your arms (e.g., wheelchair or bedside chair)?: A Little Help needed to walk in hospital room?: A Lot Help needed climbing 3-5 steps with a railing? : Total 6 Click Score: 14    End of Session Equipment Utilized During Treatment: Gait belt Activity Tolerance: Patient tolerated treatment well Patient left: in bed;with call bell/phone within reach;with bed alarm set;with family/visitor present Nurse Communication: Mobility status;Precautions PT Visit Diagnosis: Other abnormalities of gait and mobility (R26.89);Muscle weakness (generalized) (M62.81);Difficulty in walking, not elsewhere classified (R26.2)     Time: 7035-0093 PT Time Calculation (min) (ACUTE ONLY): 26 min  Charges:  $Therapeutic Exercise: 8-22 mins $Therapeutic Activity: 8-22 mins                     Leitha Bleak, PT 06/29/19, 12:34 PM [phone]

## 2019-06-29 NOTE — Progress Notes (Signed)
Speech Language Pathology Treatment: Dysphagia  Patient Details Name: [name] Foster MRN: [id number] DOB: November 19, 1940 Today's Date: 06/29/2019 Time: 5956-3875 SLP Time Calculation (min) (ACUTE ONLY): 50 min  Assessment / Plan / Recommendation Clinical Impression  Pt seen today for another session w/ pt/Wife for ongoing toleration of diet; education w/ upgraded diet recommended. Post discussion w/ MD, the Dobhoff was removed this morning. Pt frequently c/o "feeling full" and not wanting to eat. Per GI notes, pt states that he feels that the food "does not feel and taste right" in his mouth. Family felt it was related to the methotrexate medication he was given in June 2020 - however, pt exhibited weight loss back in April and May of 2020 according to chart notes of Office Visits to MDs. Pt also has h/o recent hip surgery and anemia of chronic disease per chart notes. Noted pt's recent issue w/ "not having had a bowel movement in 9 days" and needing an enema, Miralax per GI note. Reglan has also been ordered per chart. Pt stated he had a bowel movement last night.  Pt has to SLP that the "more he chews on the meats, they grow larger in my mouth until I cannot swallow them" - this appears to primarily an issue w/ meats and at times breads(per pt description). Pt was verbally conversive; pleasant. He was distracted looking out the door often as session began so the door was shut and TV muted to lessen distractions to pt. Wife present. Pt had 3 soft-solid foods to try at Lunch meal. Food and tray prepped then pt fed self. He consumed trials of Baked Sweet potato again, chicken noodle soup, grilled cheese sandwich cut into bite-size pieces dipped into the soup to moisten, and tea via Straw w/ NO overt s/s of aspiration noted; NO oral or pharyngeal phase deficits noted. Pt masticated soft foods appropriately; timely A-P transfer and swallow noted; he stated the sandwich needed dipping in the soup to "soften  it some". Oral clearing achieved w/ all trials. Wife present to witness and hear pt's feelings on the foods/meals; see the strategies to reduce distractions during meals and strategies to moisten foods(use of soup). Pt fed self stating he would try to some as a snack later around 2-3pm. (tried to avoid pressuring pt)   Discussed pt's overall presentation re: swallowing w/ pt and Wife in room. Education given to Wife and pt on food consistencies, options, and preparation; the impact of perceptions about foods on our desire to eat them as well as any GI issues such as constipation and not having regular bowel movements also having an impact on desire to eat. Discussed needing to flavor foods w/ salt/pepper/condiments to increase desire and oral intake of foods. Pt has stated he did NOT want a feeding tube to Palliative Care Nurse during meeting. Wife gave agreement to pt's statements; and SLP discussion.  Recommend continue current diet of a mech soft diet w/ thin liquids; general aspiration precautions. Discussed w/ pt and Wife to choose foods of preference and avoid meats - try soft, cooked foods including flavored foods and soups. Many Handouts given for information for home use. Recommended f/u w/ Dietician post D/C to develop a meal plan (mini meals vs 3 melas daily) to help meet pt's nutritional needs, including drink supplements and when to drink them in order to not feel overly full just before meal times. Pt/Wife, NSG and MD agreed. ST services can be available for any further needs.  HPI HPI: Pt is a 78 y.o. 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  Family states that they were discussing with oncologist today who recommended the patient come in for feeding tube placement. The patient apparently has not eaten for the past 5 days. Has been taking sips of water and ginger ale. Family says that the patient has a hard time swallowing. They have also noticed increased weakness.  Patient tolerated thin liquids.  He has loss 50 pounds in the last few months.  Worsening weakness and is unable to walk at this time.  He does have some nausea but controlled with Zofran.  Does not complain of any odynophagia.  No abdominal pain.  Patient has been following up with oncology Dr. Tasia Catchings.  During prior admission patient had started methotrexate and received 2 doses.  Family believes his multiple problems are secondary to methotrexate (it has the side effect of decreased appetite, tender gums) -- it has since been stopped ~1 month ago.  Unsure of pt's baseline Cognitive functioning; no Head CT per Imaging section of chart.  Pt lives w/ Wife at home; family near to support. Per Office Visit notes in chart, pt has been losing Weight since at least April of 2020, maybe prior -- Dietician noted same.  Pt's complaint appears centered around meats, solid foods; Dtr agreed.  Pt has a Dobhoff feeding tube in place for nutritional support; on a full liquid diet per GI.  Noted CXR is improved per report.   Pt stated he is "not hungry at all with this tube feeding going". Pt also stated to this SLP that he "he did not want a tube". This was relayed to the MD.  Addendum: the Dobhoff is out now.      SLP Plan  All goals met       Recommendations  Diet recommendations: Dysphagia 3 (mechanical soft);Thin liquid Liquids provided via: Cup;Straw Medication Administration: Whole meds with liquid(but whole in a puree if needed for easier swallowing) Supervision: Patient able to self feed Compensations: Minimize environmental distractions;Slow rate;Small sips/bites;Lingual sweep for  clearance of pocketing;Follow solids with liquid Postural Changes and/or Swallow Maneuvers: Seated upright 90 degrees;Upright 30-60 min after meal                General recommendations: (Dietician f/u at d/c) Oral Care Recommendations: Oral care BID;Patient independent with oral care(support) Follow up Recommendations: None SLP Visit Diagnosis: Dysphagia, unspecified (H40.35)(CYELYHTMB desire for certain foods) Plan: All goals met       GO                 Orinda Kenner, MS, CCC-SLP Watson,Katherine 06/29/2019, 1:44 PM

## 2019-06-29 NOTE — Discharge Instructions (Signed)
Follow-up with primary care physician in 3 days Follow-up with primary rheumatology in 1 week Follow-up with oncology Dr. Tasia Catchings in 10 days Follow-up with outpatient dietitian in a week Follow-up with gastroenterology Dr. Vicente Males in a week

## 2019-06-29 NOTE — Consult Note (Signed)
PHARMACY CONSULT NOTE - FOLLOW UP  Pharmacy Consult for Electrolyte Monitoring and Replacement   Recent Labs: Potassium (mmol/L)  Date Value  06/29/2019 3.5  04/29/2013 3.8   Magnesium (mg/dL)  Date Value  06/28/2019 1.7   Calcium (mg/dL)  Date Value  06/28/2019 7.5 (L)   Calcium, Total (mg/dL)  Date Value  04/29/2013 8.2 (L)   Albumin (g/dL)  Date Value  06/24/2019 1.7 (L)  07/30/2012 3.6   Phosphorus (mg/dL)  Date Value  06/29/2019 2.8   Sodium (mmol/L)  Date Value  06/28/2019 133 (L)  04/29/2013 136   Corrected Ca: 10.04 mg/dL  Assessment: 78 y.o. male with a H/O of RA, emphysema, interstitial lung disease presents to the ED brought in by family due to weakness, dysphagia not tolerating solids.  Patient seen by gastroenterologist and status post EGD with Dobbhoff tube placement on 06/26/2019. He is at high risk of refeeding  Goal of Therapy:  Electrolytes WNL  Plan:   No electrolyte replacement warranted today  Recheck electrolytes again with am labs  Dallie Piles ,PharmD Clinical Pharmacist 06/29/2019 7:29 AM

## 2019-06-29 NOTE — TOC Transition Note (Signed)
Transition of Care [facility]) - CM/SW Discharge Note   Patient Details  Name: PURVIS SIDLE MRN: [id number] Date of Birth: [birth year]  Transition [facility]) CM/SW Contact:  Beverly Sessions, RN Phone Number: 06/29/2019, 4:49 PM   Clinical Narrative:     Patient to discharge today Home health orders have been entered for RN, PT, and [place] with [place]  Notified of discharge  Santiago Glad with outpatient palliative notified of discharge  Final next level of care: [place] Barriers to Discharge: Barriers Resolved   Patient Goals and CMS Choice   CMS Medicare.gov Compare Post Acute Care list provided to:: Patient Represenative (must comment)(spouse) Choice offered to / list presented to : Spouse  Discharge Placement                       Discharge Plan and Services In-house Referral: Clinical Social Work Discharge Planning Services: CM Consult Post Acute Care Choice: Home Health                    HH Arranged: RN, PT, Social Work [facility] Agency: [place] ([place]) Date [place]: 06/29/19 Time [place]: [place] Representative spoke with at [place]: [place] ([place]) Interventions     Readmission Risk Interventions Readmission Risk Prevention Plan 06/25/2019  Transportation Screening Complete  PCP or Specialist Appt within 5-7 Days Complete  Home Care Screening Complete  Medication Review (RN CM) Complete  Some recent data might be hidden

## 2019-06-30 ENCOUNTER — Telehealth: Payer: Self-pay | Admitting: *Deleted

## 2019-06-30 NOTE — Telephone Encounter (Signed)
Patient discharged form hospital with Palliative Care referral and they are asking if Dr Tasia Catchings is in agreement and would sign orders for this. Please advise

## 2019-06-30 NOTE — Telephone Encounter (Signed)
I will not as he does not have confirmed cancer related issues. PCP or hospice director be attending.

## 2019-07-01 ENCOUNTER — Telehealth: Payer: Self-pay | Admitting: Primary Care

## 2019-07-01 DIAGNOSIS — D3502 Benign neoplasm of left adrenal gland: Secondary | ICD-10-CM | POA: Diagnosis not present

## 2019-07-01 DIAGNOSIS — D709 Neutropenia, unspecified: Secondary | ICD-10-CM | POA: Diagnosis not present

## 2019-07-01 DIAGNOSIS — M6281 Muscle weakness (generalized): Secondary | ICD-10-CM | POA: Diagnosis not present

## 2019-07-01 DIAGNOSIS — D509 Iron deficiency anemia, unspecified: Secondary | ICD-10-CM | POA: Diagnosis not present

## 2019-07-01 DIAGNOSIS — D075 Carcinoma in situ of prostate: Secondary | ICD-10-CM | POA: Diagnosis not present

## 2019-07-01 DIAGNOSIS — N400 Enlarged prostate without lower urinary tract symptoms: Secondary | ICD-10-CM | POA: Diagnosis not present

## 2019-07-01 DIAGNOSIS — I10 Essential (primary) hypertension: Secondary | ICD-10-CM | POA: Diagnosis not present

## 2019-07-01 DIAGNOSIS — J208 Acute bronchitis due to other specified organisms: Secondary | ICD-10-CM | POA: Diagnosis not present

## 2019-07-01 DIAGNOSIS — M0510 Rheumatoid lung disease with rheumatoid arthritis of unspecified site: Secondary | ICD-10-CM | POA: Diagnosis not present

## 2019-07-01 NOTE — Telephone Encounter (Signed)
Left message on voice mail of Dr Collie Siad response not to sign orders

## 2019-07-01 NOTE — Telephone Encounter (Signed)
Spoke with daughter Shawna Orleans regarding Palliative services and both she and patient's wife, Ileana Ladd are in agreement with this.  I have scheduled a [place] for 07/04/19 @ 11:30 Am.

## 2019-07-04 ENCOUNTER — Other Ambulatory Visit: Payer: Medicare HMO | Admitting: Primary Care

## 2019-07-04 ENCOUNTER — Other Ambulatory Visit: Payer: Self-pay

## 2019-07-04 DIAGNOSIS — Z515 Encounter for palliative care: Secondary | ICD-10-CM

## 2019-07-04 NOTE — Progress Notes (Signed)
[facility] [facility] Palliative Care Consult Note Telephone: [phone]  Fax: [phone]  TELEHEALTH VISIT STATEMENT Due to the COVID-19 crisis, this visit was done via telemedicine from my office. It was initiated and consented to by this patient and/or family.  PATIENT NAME: [name] Foster DOB: May 21, 1941 MRN: [id number]  PRIMARY CARE PROVIDER:   Cyndie Chime, MD, [place] [address] [place], NP  REFERRING PROVIDER:  Toni Arthurs NP [phone]  RESPONSIBLE PARTY:   Extended Emergency Contact Information Primary Emergency Contact: Luna Kitchens Address: [address]          [place], [place] [address] Johnnette Litter of [place] Phone: [phone] Mobile Phone: [phone] Relation: Spouse   ASSESSMENT AND RECOMMENDATIONS:   1. Advance Care Planning/Goals of Care: Goals include to maximize quality of life and symptom management. He was working until the surgery for hip replacement in June, then his decline after taking methotrexate. We discussed advance directives. They do not have anything written down for living will, etc. But wife states they know his wishes. She states he was a DNR on his medical record. I will upload to [facility]. I will give them them the book Five Wishes on our next visit, in person, for them to make a HCPOA if desired.  2. Symptom Management:   Nutrition: Recommend ST home assessment from [facility]. Eating small portions, wife states 25%. Education RE small portions and protein shakes.  They are contemplating feeding tube, but that is TBD.  Education provided re oral feeding superior over tube, and tube risks. Does not like Nutritional supplements, they  are too sweet. Recommend ST at home, home health to start soon. Wife states they did work with him with consistency.Patient has teeth, no dental problems, regular dental visits. She identified soft mechanical diet  as best tolerated.  Constipation: Using Miralax as needed Take stool softeners and no h/o constipation.   Pain: Denies pain.  Mobility:  Continue to monitor, has PT with home health starting this week. Sits on side of bed. He cannot pivot transfer. Has a walker and no w/c. PT is starting this week, [place]. HEP reviewed  That he will need to do this at home on the off days.   Depression: Continue to monitor. Wife states poor motivation. We discussed depression. She said he does not like more pills. He was just started on olanzapine with good sleep so we can revisit this issue next meeting.  3. Family /Caregiver/Community Supports: Patient had hip replacement, and then was put on methotrexate x 2 weeks po (10 mg). Lost 60 lbs in 2 months, Wife states he was never dx with RA. Weight is now 127 lb. Had been seeing oncology for prostate PSA.  Has a daughter who is a professor of nursing in [place], and granddaughter in the area. Most of care giving falls on wife. Has church friends who also sit with him. She has few visitors by design due to Covid.   4. Cognitive / Functional decline: Wife states mind is sharp, but he has gotten depressed due to loss of function. No longer ambulatory due to debility due to methotrexate, hip has healed.   5. Follow up Palliative Care Visit: Palliative care will continue to follow for goals of care clarification and symptom management. Return 3 weeks or prn.  I spent 60 minutes providing this consultation,  from 1130 to 1230. More than 50% of the time  in this consultation was spent coordinating communication.   HISTORY OF PRESENT ILLNESS:  [name] Foster is a 78 y.o. year old male with multiple medical problems including arthritis, dysphagia, protein calorie malnutrition, BPH. Palliative Care was asked to follow this patient by consultation request of Toni Arthurs NP  to help address advance care planning and goals of care. This is the initial visit.   CODE STATUS: DNR  PPS: 30% HOSPICE ELIGIBILITY/DIAGNOSIS: TBD  PAST MEDICAL HISTORY:  Past Medical History:  Diagnosis Date  . Arthritis   . BPH (benign prostatic hyperplasia)   . Former smoker, stopped smoking many years ago   . History of needle biopsy of prostate with negative result 2013  . Hypertension   . Prostate enlargement   . Prostatic intraepithelial neoplasm III 2013    SOCIAL HX:  Social History   Tobacco Use  . Smoking status: Former Smoker    Quit date: 06/04/1980    Years since quitting: 39.1  . Smokeless tobacco: Never Used  Substance Use Topics  . Alcohol use: No    ALLERGIES:  Allergies  Allergen Reactions  . Methotrexate     Other reaction(s): Other (See Comments) Weakness,      PERTINENT MEDICATIONS:  Outpatient Encounter Medications as of 07/04/2019  Medication Sig  . albuterol (VENTOLIN HFA) 108 (90 Base) MCG/ACT inhaler Inhale 2 puffs into the lungs every 6 (six) hours as needed.  . docusate sodium (COLACE) 100 MG capsule Take 1 capsule (100 mg total) by mouth daily.  Marland Kitchen doxazosin (CARDURA) 4 MG tablet Take 1 tablet (4 mg total) by mouth daily.  . feeding supplement, ENSURE ENLIVE, (ENSURE ENLIVE) LIQD Take 237 mLs by mouth 2 (two) times daily between meals.  . finasteride (PROSCAR) 5 MG tablet Take 1 tablet (5 mg total) by mouth daily.  . Fluticasone-Salmeterol (ADVAIR) 250-50 MCG/DOSE AEPB Inhale 1 puff into the lungs 2 (two) times a day.  . metoCLOPramide (REGLAN) 5 MG tablet Take 1 tablet (5 mg total) by mouth 3 (three) times daily with meals as needed for nausea or vomiting.  . Multiple Vitamin (MULTIVITAMIN WITH MINERALS) TABS tablet Take 1 tablet by mouth daily.  Marland Kitchen OLANZapine (ZYPREXA) 2.5 MG tablet Take 1 tablet (2.5 mg total) by mouth at bedtime.  . pantoprazole (PROTONIX) 40 MG tablet Take 1 tablet (40 mg total) by mouth daily.  . polyethylene glycol (MIRALAX / GLYCOLAX) 17 g packet Take 17 g by mouth daily as needed for mild  constipation.   No facility-administered encounter medications on file as of 07/04/2019.     PHYSICAL EXAM / ROS:   Current and past weights: 126 lb General: NAD, frail appearing, thin Cardiovascular: no chest pain reported, no edema,  Pulmonary: no cough, no increased SOB Abdomen: appetite fair to poor, denies constipation, continent of bowel, no current artifical nutrition GU: denies dysuria, continent of urine MSK:  no joint deformities, ambulatory but very weak, no falls,  Skin: no rashes or wounds reported Neurological: Weakness, has had a few good nights of sleep.  Cyndia Skeeters DNP AGPCNP-BC

## 2019-07-05 ENCOUNTER — Ambulatory Visit (INDEPENDENT_AMBULATORY_CARE_PROVIDER_SITE_OTHER): Payer: Medicare HMO | Admitting: Gastroenterology

## 2019-07-05 DIAGNOSIS — E43 Unspecified severe protein-calorie malnutrition: Secondary | ICD-10-CM

## 2019-07-05 NOTE — Progress Notes (Signed)
[name] Foster , MD [address]  [place]  [place], [place] [address]  Main: [phone]  Fax: [phone]   Primary Care Physician: Cyndie Chime, MD  Virtual Visit via Video Note  I connected with patient on 07/05/19 at 11:15 AM EDT by video and verified that I am speaking with the correct person using two identifiers.   I discussed the limitations, risks, security and privacy concerns of performing an evaluation and management service by video  and the availability of in person appointments. I also discussed with the patient that there may be a patient responsible charge related to this service. The patient expressed understanding and agreed to proceed.  Location of Patient: Home Location of Provider: Home Persons involved: Patient and provider only   History of Present Illness: Hospital follow-up.  HPI: [name] Foster is a 78 y.o. male  He was admitted about 2 weeks back in 06/30/2019 for a pneumonia resulting in sepsis.  On June 6 he underwent a hip replacement.  Acutely developed loss of appetite and weight loss.  Per family on admission he was refusing to eat anything with new drink liquids.  He had an EGD performed on 06/26/2019 which showed no gross abnormalities.  Feeding tube was placed endoscopically and he was commenced on feeds.  Iron levels are low.  He denied any dysphagia.  His main issue was that the food did not feel or taste right in his mouth.  He had been on methotrexate and it was stopped and all the symptoms began only after he was started on methotrexate.   Interval history   06/28/2019-  07/05/2019 2 his daughter he has been doing better since discharge eating more than what he was.  No new issues presently.  She feels he is slowly getting better.  Current Outpatient Medications  Medication Sig Dispense Refill   albuterol (VENTOLIN HFA) 108 (90 Base) MCG/ACT inhaler Inhale 2 puffs into the lungs every 6 (six) hours as needed.     docusate  sodium (COLACE) 100 MG capsule Take 1 capsule (100 mg total) by mouth daily. 30 capsule 0   doxazosin (CARDURA) 4 MG tablet Take 1 tablet (4 mg total) by mouth daily. 90 tablet 3   feeding supplement, ENSURE ENLIVE, (ENSURE ENLIVE) LIQD Take 237 mLs by mouth 2 (two) times daily between meals. 2370 mL 1   finasteride (PROSCAR) 5 MG tablet Take 1 tablet (5 mg total) by mouth daily. 90 tablet 3   Fluticasone-Salmeterol (ADVAIR) 250-50 MCG/DOSE AEPB Inhale 1 puff into the lungs 2 (two) times a day.     metoCLOPramide (REGLAN) 5 MG tablet Take 1 tablet (5 mg total) by mouth 3 (three) times daily with meals as needed for nausea or vomiting. 15 tablet 0   Multiple Vitamin (MULTIVITAMIN WITH MINERALS) TABS tablet Take 1 tablet by mouth daily.     OLANZapine (ZYPREXA) 2.5 MG tablet Take 1 tablet (2.5 mg total) by mouth at bedtime. 15 tablet 0   pantoprazole (PROTONIX) 40 MG tablet Take 1 tablet (40 mg total) by mouth daily. 30 tablet 1   polyethylene glycol (MIRALAX / GLYCOLAX) 17 g packet Take 17 g by mouth daily as needed for mild constipation. 14 each 0   No current facility-administered medications for this visit.     Allergies as of 07/05/2019 - Review Complete 06/27/2019  Allergen Reaction Noted   Methotrexate  06/02/2019    Review of Systems:    All systems reviewed and  negative except where noted in HPI.  General Appearance:    Alert, cooperative, no distress, appears stated age  Head:    Normocephalic, without obvious abnormality, atraumatic  Eyes:    PERRL, conjunctiva/corneas clear,  Ears:    Grossly normal hearing    Neurologic:  Grossly normal    Observations/Objective:  Labs: CMP     Component Value Date/Time   NA 133 (L) 06/28/2019 0624   NA 136 04/29/2013 0619   K 3.5 06/29/2019 0417   K 3.8 04/29/2013 0619   CL 98 06/28/2019 0624   CL 104 04/29/2013 0619   CO2 30 06/28/2019 0624   CO2 27 04/29/2013 0619   GLUCOSE 178 (H) 06/28/2019 0624   GLUCOSE 127 (H)  04/29/2013 0619   BUN 5 (L) 06/28/2019 0624   BUN 11 04/29/2013 0619   CREATININE <0.30 (L) 06/28/2019 0624   CREATININE 0.83 04/29/2013 0619   CALCIUM 7.5 (L) 06/28/2019 0624   CALCIUM 8.2 (L) 04/29/2013 0619   PROT 7.2 06/24/2019 1400   PROT 7.3 07/30/2012 1328   ALBUMIN 1.7 (L) 06/24/2019 1400   ALBUMIN 3.6 07/30/2012 1328   AST 25 06/24/2019 1400   AST 29 07/30/2012 1328   ALT 13 06/24/2019 1400   ALT 36 07/30/2012 1328   ALKPHOS 126 06/24/2019 1400   ALKPHOS 104 07/30/2012 1328   BILITOT 1.4 (H) 06/24/2019 1400   BILITOT 0.5 07/30/2012 1328   GFRNONAA NOT CALCULATED 06/28/2019 0624   GFRNONAA >60 04/29/2013 0619   GFRAA NOT CALCULATED 06/28/2019 0624   GFRAA >60 04/29/2013 0619   Lab Results  Component Value Date   WBC 7.7 06/28/2019   HGB 7.6 (L) 06/28/2019   HCT 25.5 (L) 06/28/2019   MCV 81.0 06/28/2019   PLT 543 (H) 06/28/2019    Imaging Studies: Ct Chest W Contrast  Result Date: 06/27/2019 CLINICAL DATA:  Dysphagia unexplained EXAM: CT CHEST, ABDOMEN, AND PELVIS WITH CONTRAST TECHNIQUE: Multidetector CT imaging of the chest, abdomen and pelvis was performed following the standard protocol during bolus administration of intravenous contrast. CONTRAST:  141m OMNIPAQUE IOHEXOL 300 MG/ML  SOLN COMPARISON:  05/25/2019 FINDINGS: CT CHEST FINDINGS Cardiovascular: Tortuous aorta. Heart is normal size. Aorta is normal caliber. Mediastinum/Nodes: No mediastinal, hilar, or axillary adenopathy. Trachea and esophagus are unremarkable. Feeding tube noted within the esophagus. Lungs/Pleura: Peripheral ground-glass opacities and reticulation in the right lung improved since prior study. Minimal residual peripheral ground-glass densities remain. Dependent bibasilar atelectasis. Mild airway thickening. No effusions. Musculoskeletal: Chest wall soft tissues are unremarkable. No acute bony abnormality CT ABDOMEN PELVIS FINDINGS Hepatobiliary: No focal hepatic abnormality. Gallbladder  unremarkable. Pancreas: No focal abnormality or ductal dilatation. Spleen: No focal abnormality.  Normal size. Adrenals/Urinary Tract: Small nodule in the left adrenal gland is stable. No suspicious mass in the right adrenal gland or kidneys. 6 mm nephrolithiasis in the midpole of the left kidney. No hydronephrosis or ureteral stones. Urinary bladder decompressed, grossly unremarkable. Stomach/Bowel: Feeding tube is in place with the tip in the distal duodenum near the ligament of Treitz. No evidence of bowel obstruction. Moderate stool throughout the colon. Lower sigmoid colon and rectum obscured by beam hardening artifact from bilateral hip replacements. Vascular/Lymphatic: Tortuous aorta with scattered calcifications. No aneurysm or dissection. No adenopathy. Reproductive: Prostate enlargement with a transverse diameter of 5 cm. Other: No free fluid or free air. Musculoskeletal: Bilateral hip replacements. Degenerative changes in the lumbar spine. No acute or focal bony abnormality. IMPRESSION: Peripheral ground-glass and reticular opacities in the  right lung have improved since prior study. Minimal residual peripheral opacities. Dependent bibasilar atelectasis. Aortic atherosclerosis and tortuosity. Left nephrolithiasis is unchanged.  No hydronephrosis. Feeding tube tip in the distal duodenum near the ligament of Treitz. Moderate stool burden throughout the colon. No acute findings in the abdomen or pelvis. Electronically Signed   By: Rolm Baptise M.D.   On: 06/27/2019 13:26   Ct Abdomen Pelvis W Contrast  Result Date: 06/27/2019 CLINICAL DATA:  Dysphagia unexplained EXAM: CT CHEST, ABDOMEN, AND PELVIS WITH CONTRAST TECHNIQUE: Multidetector CT imaging of the chest, abdomen and pelvis was performed following the standard protocol during bolus administration of intravenous contrast. CONTRAST:  170m OMNIPAQUE IOHEXOL 300 MG/ML  SOLN COMPARISON:  05/25/2019 FINDINGS: CT CHEST FINDINGS Cardiovascular: Tortuous  aorta. Heart is normal size. Aorta is normal caliber. Mediastinum/Nodes: No mediastinal, hilar, or axillary adenopathy. Trachea and esophagus are unremarkable. Feeding tube noted within the esophagus. Lungs/Pleura: Peripheral ground-glass opacities and reticulation in the right lung improved since prior study. Minimal residual peripheral ground-glass densities remain. Dependent bibasilar atelectasis. Mild airway thickening. No effusions. Musculoskeletal: Chest wall soft tissues are unremarkable. No acute bony abnormality CT ABDOMEN PELVIS FINDINGS Hepatobiliary: No focal hepatic abnormality. Gallbladder unremarkable. Pancreas: No focal abnormality or ductal dilatation. Spleen: No focal abnormality.  Normal size. Adrenals/Urinary Tract: Small nodule in the left adrenal gland is stable. No suspicious mass in the right adrenal gland or kidneys. 6 mm nephrolithiasis in the midpole of the left kidney. No hydronephrosis or ureteral stones. Urinary bladder decompressed, grossly unremarkable. Stomach/Bowel: Feeding tube is in place with the tip in the distal duodenum near the ligament of Treitz. No evidence of bowel obstruction. Moderate stool throughout the colon. Lower sigmoid colon and rectum obscured by beam hardening artifact from bilateral hip replacements. Vascular/Lymphatic: Tortuous aorta with scattered calcifications. No aneurysm or dissection. No adenopathy. Reproductive: Prostate enlargement with a transverse diameter of 5 cm. Other: No free fluid or free air. Musculoskeletal: Bilateral hip replacements. Degenerative changes in the lumbar spine. No acute or focal bony abnormality. IMPRESSION: Peripheral ground-glass and reticular opacities in the right lung have improved since prior study. Minimal residual peripheral opacities. Dependent bibasilar atelectasis. Aortic atherosclerosis and tortuosity. Left nephrolithiasis is unchanged.  No hydronephrosis. Feeding tube tip in the distal duodenum near the ligament of  Treitz. Moderate stool burden throughout the colon. No acute findings in the abdomen or pelvis. Electronically Signed   By: KRolm BaptiseM.D.   On: 06/27/2019 13:26   Dg Chest Portable 1 View  Result Date: 06/24/2019 CLINICAL DATA:  Feeding tube placement.  Inability to swallow. EXAM: PORTABLE CHEST 1 VIEW COMPARISON:  May 25, 2019 FINDINGS: No feeding tube identified. The heart, hila, mediastinum, lungs, and pleura are unchanged with no acute abnormalities. IMPRESSION: No active disease. Electronically Signed   By: DDorise BullionIII M.D   On: 06/24/2019 15:36   Ct Bone Marrow Biopsy & Aspiration  Result Date: 06/16/2019 INDICATION: 78year old with iron deficiency anemia. EXAM: CT GUIDED BONE MARROW ASPIRATES AND BIOPSY Physician: AStephan Minister HAnselm Pancoast MD MEDICATIONS: None. ANESTHESIA/SEDATION: Fentanyl 50 mcg IV; Versed 1.0 mg IV Moderate Sedation Time:  12 minutes The patient was continuously monitored during the procedure by the interventional radiology nurse under my direct supervision. COMPLICATIONS: None immediate. PROCEDURE: The procedure was explained to the patient. The risks and benefits of the procedure were discussed and the patient's questions were addressed. Informed consent was obtained from the patient. The patient was placed prone on CT table. Images of the  pelvis were obtained. The right side of back was prepped and draped in sterile fashion. The skin and right posterior ilium were anesthetized with 1% lidocaine. 11 gauge bone needle was directed into the right ilium with CT guidance. Two aspirates and one core biopsy were obtained. Bandage placed over the puncture site. IMPRESSION: CT guided bone marrow aspiration and core biopsy. Electronically Signed   By: Markus Daft M.D.   On: 06/16/2019 10:33    Assessment and Plan:   VERON SENNER is a 78 y.o. y/o male for hospital follow-up.  I saw him in the hospital for "dysphagia".  On further discussion it was more of an issue that he had  lack of interest in eating and had an unpleasant sensation in his mouth which made it hard for him to eat solid food.  Probably related to the methotrexate that he had been taking.  Things have been improving since discharge and he is tolerating oral foods more than what he was during the hospital.  His daughter is happy with his progress and is aware that if things were to deteriorate she will call us right away.    I discussed the assessment and treatment plan with the patient. The patient was provided an opportunity to ask questions and all were answered. The patient agreed with the plan and demonstrated an understanding of the instructions.   The patient was advised to call back or seek an in-person evaluation if the symptoms worsen or if the condition fails to improve as anticipated.  I provided 11 minutes of face-to-face time during this encounter.  Dr [name] Bellows MD,MRCP [facility]) Gastroenterology/Hepatology Pager: [phone]   Speech recognition software was used to dictate this note.

## 2019-07-06 DIAGNOSIS — M6281 Muscle weakness (generalized): Secondary | ICD-10-CM | POA: Diagnosis not present

## 2019-07-06 DIAGNOSIS — J208 Acute bronchitis due to other specified organisms: Secondary | ICD-10-CM | POA: Diagnosis not present

## 2019-07-06 DIAGNOSIS — I10 Essential (primary) hypertension: Secondary | ICD-10-CM | POA: Diagnosis not present

## 2019-07-06 DIAGNOSIS — M0510 Rheumatoid lung disease with rheumatoid arthritis of unspecified site: Secondary | ICD-10-CM | POA: Diagnosis not present

## 2019-07-06 DIAGNOSIS — D3502 Benign neoplasm of left adrenal gland: Secondary | ICD-10-CM | POA: Diagnosis not present

## 2019-07-06 DIAGNOSIS — D075 Carcinoma in situ of prostate: Secondary | ICD-10-CM | POA: Diagnosis not present

## 2019-07-06 DIAGNOSIS — N400 Enlarged prostate without lower urinary tract symptoms: Secondary | ICD-10-CM | POA: Diagnosis not present

## 2019-07-06 DIAGNOSIS — D509 Iron deficiency anemia, unspecified: Secondary | ICD-10-CM | POA: Diagnosis not present

## 2019-07-06 DIAGNOSIS — D709 Neutropenia, unspecified: Secondary | ICD-10-CM | POA: Diagnosis not present

## 2019-07-07 DIAGNOSIS — M0510 Rheumatoid lung disease with rheumatoid arthritis of unspecified site: Secondary | ICD-10-CM | POA: Diagnosis not present

## 2019-07-07 DIAGNOSIS — I10 Essential (primary) hypertension: Secondary | ICD-10-CM | POA: Diagnosis not present

## 2019-07-07 DIAGNOSIS — D709 Neutropenia, unspecified: Secondary | ICD-10-CM | POA: Diagnosis not present

## 2019-07-07 DIAGNOSIS — D3502 Benign neoplasm of left adrenal gland: Secondary | ICD-10-CM | POA: Diagnosis not present

## 2019-07-07 DIAGNOSIS — D509 Iron deficiency anemia, unspecified: Secondary | ICD-10-CM | POA: Diagnosis not present

## 2019-07-07 DIAGNOSIS — M6281 Muscle weakness (generalized): Secondary | ICD-10-CM | POA: Diagnosis not present

## 2019-07-07 DIAGNOSIS — D075 Carcinoma in situ of prostate: Secondary | ICD-10-CM | POA: Diagnosis not present

## 2019-07-07 DIAGNOSIS — N400 Enlarged prostate without lower urinary tract symptoms: Secondary | ICD-10-CM | POA: Diagnosis not present

## 2019-07-07 DIAGNOSIS — J208 Acute bronchitis due to other specified organisms: Secondary | ICD-10-CM | POA: Diagnosis not present

## 2019-07-08 DIAGNOSIS — M0510 Rheumatoid lung disease with rheumatoid arthritis of unspecified site: Secondary | ICD-10-CM | POA: Diagnosis not present

## 2019-07-08 DIAGNOSIS — N400 Enlarged prostate without lower urinary tract symptoms: Secondary | ICD-10-CM | POA: Diagnosis not present

## 2019-07-08 DIAGNOSIS — D075 Carcinoma in situ of prostate: Secondary | ICD-10-CM | POA: Diagnosis not present

## 2019-07-08 DIAGNOSIS — M6281 Muscle weakness (generalized): Secondary | ICD-10-CM | POA: Diagnosis not present

## 2019-07-08 DIAGNOSIS — I10 Essential (primary) hypertension: Secondary | ICD-10-CM | POA: Diagnosis not present

## 2019-07-08 DIAGNOSIS — D509 Iron deficiency anemia, unspecified: Secondary | ICD-10-CM | POA: Diagnosis not present

## 2019-07-08 DIAGNOSIS — D3502 Benign neoplasm of left adrenal gland: Secondary | ICD-10-CM | POA: Diagnosis not present

## 2019-07-08 DIAGNOSIS — D709 Neutropenia, unspecified: Secondary | ICD-10-CM | POA: Diagnosis not present

## 2019-07-08 DIAGNOSIS — J208 Acute bronchitis due to other specified organisms: Secondary | ICD-10-CM | POA: Diagnosis not present

## 2019-07-11 DIAGNOSIS — J208 Acute bronchitis due to other specified organisms: Secondary | ICD-10-CM | POA: Diagnosis not present

## 2019-07-11 DIAGNOSIS — D509 Iron deficiency anemia, unspecified: Secondary | ICD-10-CM | POA: Diagnosis not present

## 2019-07-11 DIAGNOSIS — M0510 Rheumatoid lung disease with rheumatoid arthritis of unspecified site: Secondary | ICD-10-CM | POA: Diagnosis not present

## 2019-07-11 DIAGNOSIS — N400 Enlarged prostate without lower urinary tract symptoms: Secondary | ICD-10-CM | POA: Diagnosis not present

## 2019-07-11 DIAGNOSIS — D3502 Benign neoplasm of left adrenal gland: Secondary | ICD-10-CM | POA: Diagnosis not present

## 2019-07-11 DIAGNOSIS — I10 Essential (primary) hypertension: Secondary | ICD-10-CM | POA: Diagnosis not present

## 2019-07-11 DIAGNOSIS — D709 Neutropenia, unspecified: Secondary | ICD-10-CM | POA: Diagnosis not present

## 2019-07-11 DIAGNOSIS — D075 Carcinoma in situ of prostate: Secondary | ICD-10-CM | POA: Diagnosis not present

## 2019-07-11 DIAGNOSIS — M6281 Muscle weakness (generalized): Secondary | ICD-10-CM | POA: Diagnosis not present

## 2019-07-13 DIAGNOSIS — D3502 Benign neoplasm of left adrenal gland: Secondary | ICD-10-CM | POA: Diagnosis not present

## 2019-07-13 DIAGNOSIS — D075 Carcinoma in situ of prostate: Secondary | ICD-10-CM | POA: Diagnosis not present

## 2019-07-13 DIAGNOSIS — M0510 Rheumatoid lung disease with rheumatoid arthritis of unspecified site: Secondary | ICD-10-CM | POA: Diagnosis not present

## 2019-07-13 DIAGNOSIS — J208 Acute bronchitis due to other specified organisms: Secondary | ICD-10-CM | POA: Diagnosis not present

## 2019-07-13 DIAGNOSIS — M6281 Muscle weakness (generalized): Secondary | ICD-10-CM | POA: Diagnosis not present

## 2019-07-13 DIAGNOSIS — N400 Enlarged prostate without lower urinary tract symptoms: Secondary | ICD-10-CM | POA: Diagnosis not present

## 2019-07-13 DIAGNOSIS — D709 Neutropenia, unspecified: Secondary | ICD-10-CM | POA: Diagnosis not present

## 2019-07-13 DIAGNOSIS — D509 Iron deficiency anemia, unspecified: Secondary | ICD-10-CM | POA: Diagnosis not present

## 2019-07-13 DIAGNOSIS — I10 Essential (primary) hypertension: Secondary | ICD-10-CM | POA: Diagnosis not present

## 2019-07-14 ENCOUNTER — Other Ambulatory Visit: Payer: Self-pay | Admitting: Family Medicine

## 2019-07-14 ENCOUNTER — Telehealth: Payer: Self-pay

## 2019-07-14 ENCOUNTER — Other Ambulatory Visit: Payer: Self-pay

## 2019-07-14 DIAGNOSIS — D709 Neutropenia, unspecified: Secondary | ICD-10-CM | POA: Diagnosis not present

## 2019-07-14 DIAGNOSIS — D075 Carcinoma in situ of prostate: Secondary | ICD-10-CM | POA: Diagnosis not present

## 2019-07-14 DIAGNOSIS — J208 Acute bronchitis due to other specified organisms: Secondary | ICD-10-CM | POA: Diagnosis not present

## 2019-07-14 DIAGNOSIS — B9681 Helicobacter pylori [H. pylori] as the cause of diseases classified elsewhere: Secondary | ICD-10-CM

## 2019-07-14 DIAGNOSIS — N400 Enlarged prostate without lower urinary tract symptoms: Secondary | ICD-10-CM | POA: Diagnosis not present

## 2019-07-14 DIAGNOSIS — D3502 Benign neoplasm of left adrenal gland: Secondary | ICD-10-CM | POA: Diagnosis not present

## 2019-07-14 DIAGNOSIS — I10 Essential (primary) hypertension: Secondary | ICD-10-CM | POA: Diagnosis not present

## 2019-07-14 DIAGNOSIS — M0510 Rheumatoid lung disease with rheumatoid arthritis of unspecified site: Secondary | ICD-10-CM | POA: Diagnosis not present

## 2019-07-14 DIAGNOSIS — M6281 Muscle weakness (generalized): Secondary | ICD-10-CM | POA: Diagnosis not present

## 2019-07-14 DIAGNOSIS — D509 Iron deficiency anemia, unspecified: Secondary | ICD-10-CM | POA: Diagnosis not present

## 2019-07-14 MED ORDER — FINASTERIDE 5 MG PO TABS: 5.0000 mg | ORAL_TABLET | Freq: Every day | ORAL | 3 refills | Status: DC

## 2019-07-14 MED ORDER — CLARITHROMYCIN 500 MG PO TABS: 500.0000 mg | ORAL_TABLET | Freq: Two times a day (BID) | ORAL | 0 refills | Status: AC

## 2019-07-14 MED ORDER — AMOXICILLIN 500 MG PO CAPS: 1000.0000 mg | ORAL_CAPSULE | Freq: Three times a day (TID) | ORAL | 0 refills | Status: AC

## 2019-07-14 MED ORDER — DOXAZOSIN MESYLATE 4 MG PO TABS: 4.0000 mg | ORAL_TABLET | Freq: Every day | ORAL | 3 refills | Status: DC

## 2019-07-14 NOTE — Telephone Encounter (Signed)
-----   Message from Jonathon Bellows, MD sent at 07/11/2019 10:07 AM EDT ----- Please inform that he has H. pylori gastritis and that may also have been contributing to his difficulty in keeping food down.  These are from the biopsies taken during his endoscopy from recent hospitalization. H pylori gastritis.   Suggest clarithromycin 500 mg PO BID, amoxicillin 1 gram TID, omeprazole 20 mg BID all for 14 days.  , check for penicillin allergy and will need repeat H pylori stool antigen to check for eradication after .

## 2019-07-14 NOTE — Telephone Encounter (Signed)
Spoke with pt spouse and informed her of pt biopsy results and Dr. Georgeann Oppenheim plan for treatment. She agrees. Prescriptions have been sent to pt preferred pharmacy.

## 2019-07-14 NOTE — Addendum Note (Signed)
Addended by: Dorethea Clan on: 07/14/2019 05:33 PM   Modules accepted: Orders

## 2019-07-15 DIAGNOSIS — M6281 Muscle weakness (generalized): Secondary | ICD-10-CM | POA: Diagnosis not present

## 2019-07-15 DIAGNOSIS — M19012 Primary osteoarthritis, left shoulder: Secondary | ICD-10-CM | POA: Diagnosis not present

## 2019-07-19 ENCOUNTER — Encounter: Payer: Self-pay | Admitting: Oncology

## 2019-07-19 ENCOUNTER — Inpatient Hospital Stay: Payer: Medicare HMO | Attending: Oncology | Admitting: Oncology

## 2019-07-19 DIAGNOSIS — D509 Iron deficiency anemia, unspecified: Secondary | ICD-10-CM | POA: Diagnosis not present

## 2019-07-19 DIAGNOSIS — D075 Carcinoma in situ of prostate: Secondary | ICD-10-CM | POA: Diagnosis not present

## 2019-07-19 DIAGNOSIS — D709 Neutropenia, unspecified: Secondary | ICD-10-CM | POA: Diagnosis not present

## 2019-07-19 DIAGNOSIS — D649 Anemia, unspecified: Secondary | ICD-10-CM

## 2019-07-19 DIAGNOSIS — N400 Enlarged prostate without lower urinary tract symptoms: Secondary | ICD-10-CM | POA: Diagnosis not present

## 2019-07-19 DIAGNOSIS — D473 Essential (hemorrhagic) thrombocythemia: Secondary | ICD-10-CM

## 2019-07-19 DIAGNOSIS — M059 Rheumatoid arthritis with rheumatoid factor, unspecified: Secondary | ICD-10-CM

## 2019-07-19 DIAGNOSIS — M6281 Muscle weakness (generalized): Secondary | ICD-10-CM | POA: Diagnosis not present

## 2019-07-19 DIAGNOSIS — D75839 Thrombocytosis, unspecified: Secondary | ICD-10-CM

## 2019-07-19 DIAGNOSIS — M0510 Rheumatoid lung disease with rheumatoid arthritis of unspecified site: Secondary | ICD-10-CM | POA: Diagnosis not present

## 2019-07-19 DIAGNOSIS — R634 Abnormal weight loss: Secondary | ICD-10-CM

## 2019-07-19 DIAGNOSIS — J208 Acute bronchitis due to other specified organisms: Secondary | ICD-10-CM | POA: Diagnosis not present

## 2019-07-19 DIAGNOSIS — D3502 Benign neoplasm of left adrenal gland: Secondary | ICD-10-CM | POA: Diagnosis not present

## 2019-07-19 DIAGNOSIS — I10 Essential (primary) hypertension: Secondary | ICD-10-CM | POA: Diagnosis not present

## 2019-07-19 NOTE — Progress Notes (Signed)
HEMATOLOGY-ONCOLOGY TeleHEALTH VISIT PROGRESS NOTE  I connected with [name] Foster on 07/19/19 at  1:15 PM EDT by video enabled telemedicine visit and verified that I am speaking with the correct person using two identifiers. I discussed the limitations, risks, security and privacy concerns of performing an evaluation and management service by telemedicine and the availability of in-person appointments. I also discussed with the patient that there may be a patient responsible charge related to this service. The patient expressed understanding and agreed to proceed.   Other persons participating in the visit and their role in the encounter:  Wife [place]  Patient's location: Home  Provider's location: home office Chief Complaint: follow up for anemia.    INTERVAL HISTORY [name] Foster is a 78 y.o. male who has above history reviewed by me today presents for follow up visit for management of anemia Problems and complaints are listed below:  Patient was hospitalized between 06/23/2021 06/29/2019 due to failure to thrive weight loss worsening weakness and severe protein calorie malnutrition, dysphagia. Patient was evaluated by gastroenterology during that admission and status post EGD with Dobbhoff tube placement on 06/26/2019.  Tube feeding initiated to ensure that patient will tolerate tube feeding before placement of PEG tube placement.  Patient was able to tolerate feeding, and was able to tolerate some p.o.  And was discharged home EGD also showed H pyloric gastritis patient was treated with clarithromycin, amoxicillin and omeprazole. Patient reports weakness is better.  Able to tolerate some food.  No nausea vomiting. He is able to walk a few steps in his house. Denies any pain today.  Patient was seen by gastroenterology Dr. Vicente Males on Apr 04, 2019.  Review of Systems  Constitutional: Positive for fatigue. Negative for appetite change and unexpected weight change.  HENT:    Negative for hearing loss.   Respiratory: Negative for shortness of breath.   Cardiovascular: Negative for chest pain.  Gastrointestinal: Negative for abdominal pain and nausea.  Genitourinary: Negative for dysuria and frequency.   Musculoskeletal: Negative for back pain.  Skin: Negative for itching and rash.  Neurological: Negative for headaches.  Hematological: Negative for adenopathy.  Psychiatric/Behavioral: Negative for confusion.    Past Medical History:  Diagnosis Date  . Arthritis   . BPH (benign prostatic hyperplasia)   . Former smoker, stopped smoking many years ago   . History of needle biopsy of prostate with negative result 2013  . Hypertension   . Prostate enlargement   . Prostatic intraepithelial neoplasm III 2013   Past Surgical History:  Procedure Laterality Date  . ESOPHAGOGASTRODUODENOSCOPY N/A 06/26/2019   Procedure: ESOPHAGOGASTRODUODENOSCOPY (EGD);  Surgeon: Lin Landsman, MD;  Location[facility] ENDOSCOPY;  Service: Gastroenterology;  Laterality: N/A;  . HEMORROIDECTOMY    . HERNIA REPAIR    . JOINT REPLACEMENT Left    THR  . PROSTATE BIOPSY  2013  . TOTAL HIP ARTHROPLASTY Left   . TOTAL HIP ARTHROPLASTY Right 04/26/2019   Procedure: TOTAL HIP ARTHROPLASTY ANTERIOR APPROACH;  Surgeon: Hessie Knows, MD;  Location: [facility] ORS;  Service: Orthopedics;  Laterality: Right;    Family History  Problem Relation Age of Onset  . Prostate cancer Father   . Cancer Father   . Prostate cancer Brother   . Cancer Mother     Social History   Socioeconomic History  . Marital status: Married    Spouse name: Not on file  . Number of children: Not on file  . Years of education: Not on file  .  Highest education level: Not on file  Occupational History  . Not on file  Social Needs  . Financial resource strain: Not on file  . Food insecurity    Worry: Not on file    Inability: Not on file  . Transportation needs    Medical: Not on file    Non-medical: Not on  file  Tobacco Use  . Smoking status: Former Smoker    Quit date: 06/04/1980    Years since quitting: 39.1  . Smokeless tobacco: Never Used  Substance and Sexual Activity  . Alcohol use: No  . Drug use: No  . Sexual activity: Yes  Lifestyle  . Physical activity    Days per week: Not on file    Minutes per session: Not on file  . Stress: Not on file  Relationships  . Social Herbalist on phone: Not on file    Gets together: Not on file    Attends religious service: Not on file    Active member of club or organization: Not on file    Attends meetings of clubs or organizations: Not on file    Relationship status: Not on file  . Intimate partner violence    Fear of current or ex partner: Not on file    Emotionally abused: Not on file    Physically abused: Not on file    Forced sexual activity: Not on file  Other Topics Concern  . Not on file  Social History Narrative  . Not on file    Current Outpatient Medications on File Prior to Visit  Medication Sig Dispense Refill  . albuterol (VENTOLIN HFA) 108 (90 Base) MCG/ACT inhaler Inhale 2 puffs into the lungs every 6 (six) hours as needed.    Marland Kitchen amoxicillin (AMOXIL) 500 MG capsule Take 2 capsules (1,000 mg total) by mouth 3 (three) times daily for 14 days. 84 capsule 0  . clarithromycin (BIAXIN) 500 MG tablet Take 1 tablet (500 mg total) by mouth 2 (two) times daily for 14 days. 28 tablet 0  . docusate sodium (COLACE) 100 MG capsule Take 1 capsule (100 mg total) by mouth daily. 30 capsule 0  . doxazosin (CARDURA) 4 MG tablet Take 1 tablet (4 mg total) by mouth daily. 90 tablet 3  . finasteride (PROSCAR) 5 MG tablet Take 1 tablet (5 mg total) by mouth daily. 90 tablet 3  . Fluticasone-Salmeterol (ADVAIR) 250-50 MCG/DOSE AEPB Inhale 1 puff into the lungs 2 (two) times a day.    . Multiple Vitamin (MULTIVITAMIN WITH MINERALS) TABS tablet Take 1 tablet by mouth daily.    . feeding supplement, ENSURE ENLIVE, (ENSURE ENLIVE) LIQD  Take 237 mLs by mouth 2 (two) times daily between meals. (Patient not taking: Reported on 07/19/2019) 2370 mL 1  . metoCLOPramide (REGLAN) 5 MG tablet Take 1 tablet (5 mg total) by mouth 3 (three) times daily with meals as needed for nausea or vomiting. (Patient not taking: Reported on 07/19/2019) 15 tablet 0  . OLANZapine (ZYPREXA) 2.5 MG tablet Take 1 tablet (2.5 mg total) by mouth at bedtime. (Patient not taking: Reported on 07/19/2019) 15 tablet 0  . pantoprazole (PROTONIX) 40 MG tablet Take 1 tablet (40 mg total) by mouth daily. (Patient not taking: Reported on 07/19/2019) 30 tablet 1  . polyethylene glycol (MIRALAX / GLYCOLAX) 17 g packet Take 17 g by mouth daily as needed for mild constipation. (Patient not taking: Reported on 07/19/2019) 14 each 0   No current  facility-administered medications on file prior to visit.     Allergies  Allergen Reactions  . Methotrexate     Other reaction(s): Other (See Comments) Weakness,        Observations/Objective: There were no vitals filed for this visit. There is no height or weight on file to calculate BMI.  Physical Exam  Constitutional: He is oriented to person, place, and time. No distress.  Neurological: He is alert and oriented to person, place, and time.  Psychiatric: Affect normal.    CBC    Component Value Date/Time   WBC 7.7 06/28/2019 0624   RBC 3.15 (L) 06/28/2019 0624   HGB 7.6 (L) 06/28/2019 0624   HGB 10.7 (L) 04/29/2013 0619   HCT 25.5 (L) 06/28/2019 0624   HCT 39.8 (L) 04/11/2013 1454   PLT 543 (H) 06/28/2019 0624   PLT 187 04/29/2013 0619   MCV 81.0 06/28/2019 0624   MCV 90 04/11/2013 1454   MCH 24.1 (L) 06/28/2019 0624   MCHC 29.8 (L) 06/28/2019 0624   RDW 17.0 (H) 06/28/2019 0624   RDW 13.8 04/11/2013 1454   LYMPHSABS 1.6 06/13/2019 1143   MONOABS 0.9 06/13/2019 1143   EOSABS 0.0 06/13/2019 1143   BASOSABS 0.0 06/13/2019 1143    CMP     Component Value Date/Time   NA 133 (L) 06/28/2019 0624   NA 136 04/29/2013  0619   K 3.5 06/29/2019 0417   K 3.8 04/29/2013 0619   CL 98 06/28/2019 0624   CL 104 04/29/2013 0619   CO2 30 06/28/2019 0624   CO2 27 04/29/2013 0619   GLUCOSE 178 (H) 06/28/2019 0624   GLUCOSE 127 (H) 04/29/2013 0619   BUN 5 (L) 06/28/2019 0624   BUN 11 04/29/2013 0619   CREATININE <0.30 (L) 06/28/2019 0624   CREATININE 0.83 04/29/2013 0619   CALCIUM 7.5 (L) 06/28/2019 0624   CALCIUM 8.2 (L) 04/29/2013 0619   PROT 7.2 06/24/2019 1400   PROT 7.3 07/30/2012 1328   ALBUMIN 1.7 (L) 06/24/2019 1400   ALBUMIN 3.6 07/30/2012 1328   AST 25 06/24/2019 1400   AST 29 07/30/2012 1328   ALT 13 06/24/2019 1400   ALT 36 07/30/2012 1328   ALKPHOS 126 06/24/2019 1400   ALKPHOS 104 07/30/2012 1328   BILITOT 1.4 (H) 06/24/2019 1400   BILITOT 0.5 07/30/2012 1328   GFRNONAA NOT CALCULATED 06/28/2019 0624   GFRNONAA >60 04/29/2013 0619   GFRAA NOT CALCULATED 06/28/2019 0624   GFRAA >60 04/29/2013 0619     Assessment and Plan: 1. Normocytic anemia   2. Thrombocytosis (HCC)   3. Seropositive rheumatoid arthritis ([place])   4. Weight loss     Patient appears improving and making progress as.  Tolerating p.o.'s. Recommend patient to repeat blood work. Patient wanted see if he can get blood work done.  Home health.  Check CBC, CMP, reticulocyte panel, iron TIBC and ferritin.   Follow Up Instructions: 6- 8 weeks.    I discussed the assessment and treatment plan with the patient. The patient was provided an opportunity to ask questions and all were answered. The patient agreed with the plan and demonstrated an understanding of the instructions.  The patient was advised to call back or seek an in-person evaluation if the symptoms worsen or if the condition fails to improve as anticipated.   I provided 15 minutes of face-to-face video visit time during this encounter, and > 50% was spent counseling as documented under my assessment & plan.  Talbert Cage  Tasia Catchings, MD 07/19/2019 10:29 PM

## 2019-07-20 ENCOUNTER — Telehealth: Payer: Self-pay

## 2019-07-20 ENCOUNTER — Other Ambulatory Visit: Payer: Self-pay

## 2019-07-20 DIAGNOSIS — D075 Carcinoma in situ of prostate: Secondary | ICD-10-CM | POA: Diagnosis not present

## 2019-07-20 DIAGNOSIS — D3502 Benign neoplasm of left adrenal gland: Secondary | ICD-10-CM | POA: Diagnosis not present

## 2019-07-20 DIAGNOSIS — I10 Essential (primary) hypertension: Secondary | ICD-10-CM | POA: Diagnosis not present

## 2019-07-20 DIAGNOSIS — M0510 Rheumatoid lung disease with rheumatoid arthritis of unspecified site: Secondary | ICD-10-CM | POA: Diagnosis not present

## 2019-07-20 DIAGNOSIS — D709 Neutropenia, unspecified: Secondary | ICD-10-CM | POA: Diagnosis not present

## 2019-07-20 DIAGNOSIS — M6281 Muscle weakness (generalized): Secondary | ICD-10-CM | POA: Diagnosis not present

## 2019-07-20 DIAGNOSIS — D509 Iron deficiency anemia, unspecified: Secondary | ICD-10-CM | POA: Diagnosis not present

## 2019-07-20 DIAGNOSIS — J208 Acute bronchitis due to other specified organisms: Secondary | ICD-10-CM | POA: Diagnosis not present

## 2019-07-20 DIAGNOSIS — N400 Enlarged prostate without lower urinary tract symptoms: Secondary | ICD-10-CM | POA: Diagnosis not present

## 2019-07-20 NOTE — Telephone Encounter (Signed)
Orders faxed to [facility] for lab draw this week.  (CBC, Reticulocyte panel, Iron & TIBC, Ferritin).

## 2019-07-21 DIAGNOSIS — J208 Acute bronchitis due to other specified organisms: Secondary | ICD-10-CM | POA: Diagnosis not present

## 2019-07-21 DIAGNOSIS — I10 Essential (primary) hypertension: Secondary | ICD-10-CM | POA: Diagnosis not present

## 2019-07-21 DIAGNOSIS — D3502 Benign neoplasm of left adrenal gland: Secondary | ICD-10-CM | POA: Diagnosis not present

## 2019-07-21 DIAGNOSIS — D075 Carcinoma in situ of prostate: Secondary | ICD-10-CM | POA: Diagnosis not present

## 2019-07-21 DIAGNOSIS — M0510 Rheumatoid lung disease with rheumatoid arthritis of unspecified site: Secondary | ICD-10-CM | POA: Diagnosis not present

## 2019-07-21 DIAGNOSIS — M6281 Muscle weakness (generalized): Secondary | ICD-10-CM | POA: Diagnosis not present

## 2019-07-21 DIAGNOSIS — D709 Neutropenia, unspecified: Secondary | ICD-10-CM | POA: Diagnosis not present

## 2019-07-21 DIAGNOSIS — D509 Iron deficiency anemia, unspecified: Secondary | ICD-10-CM | POA: Diagnosis not present

## 2019-07-21 DIAGNOSIS — N400 Enlarged prostate without lower urinary tract symptoms: Secondary | ICD-10-CM | POA: Diagnosis not present

## 2019-07-22 DIAGNOSIS — D509 Iron deficiency anemia, unspecified: Secondary | ICD-10-CM | POA: Diagnosis not present

## 2019-07-22 DIAGNOSIS — J208 Acute bronchitis due to other specified organisms: Secondary | ICD-10-CM | POA: Diagnosis not present

## 2019-07-22 DIAGNOSIS — D075 Carcinoma in situ of prostate: Secondary | ICD-10-CM | POA: Diagnosis not present

## 2019-07-22 DIAGNOSIS — I10 Essential (primary) hypertension: Secondary | ICD-10-CM | POA: Diagnosis not present

## 2019-07-22 DIAGNOSIS — J028 Acute pharyngitis due to other specified organisms: Secondary | ICD-10-CM | POA: Diagnosis not present

## 2019-07-22 DIAGNOSIS — D3502 Benign neoplasm of left adrenal gland: Secondary | ICD-10-CM | POA: Diagnosis not present

## 2019-07-22 DIAGNOSIS — M0510 Rheumatoid lung disease with rheumatoid arthritis of unspecified site: Secondary | ICD-10-CM | POA: Diagnosis not present

## 2019-07-22 DIAGNOSIS — N400 Enlarged prostate without lower urinary tract symptoms: Secondary | ICD-10-CM | POA: Diagnosis not present

## 2019-07-22 DIAGNOSIS — M6281 Muscle weakness (generalized): Secondary | ICD-10-CM | POA: Diagnosis not present

## 2019-07-22 DIAGNOSIS — D709 Neutropenia, unspecified: Secondary | ICD-10-CM | POA: Diagnosis not present

## 2019-07-26 ENCOUNTER — Encounter: Payer: Self-pay | Admitting: Oncology

## 2019-07-29 ENCOUNTER — Other Ambulatory Visit: Payer: Medicare HMO | Admitting: Primary Care

## 2019-07-29 ENCOUNTER — Other Ambulatory Visit: Payer: Self-pay

## 2019-07-29 DIAGNOSIS — I10 Essential (primary) hypertension: Secondary | ICD-10-CM | POA: Diagnosis not present

## 2019-07-29 DIAGNOSIS — Z515 Encounter for palliative care: Secondary | ICD-10-CM

## 2019-07-29 DIAGNOSIS — E43 Unspecified severe protein-calorie malnutrition: Secondary | ICD-10-CM | POA: Diagnosis not present

## 2019-07-29 DIAGNOSIS — R1310 Dysphagia, unspecified: Secondary | ICD-10-CM | POA: Diagnosis not present

## 2019-07-29 DIAGNOSIS — M0510 Rheumatoid lung disease with rheumatoid arthritis of unspecified site: Secondary | ICD-10-CM | POA: Diagnosis not present

## 2019-07-29 DIAGNOSIS — D539 Nutritional anemia, unspecified: Secondary | ICD-10-CM | POA: Diagnosis not present

## 2019-07-29 DIAGNOSIS — J439 Emphysema, unspecified: Secondary | ICD-10-CM | POA: Diagnosis not present

## 2019-07-29 DIAGNOSIS — M6281 Muscle weakness (generalized): Secondary | ICD-10-CM | POA: Diagnosis not present

## 2019-07-29 DIAGNOSIS — D709 Neutropenia, unspecified: Secondary | ICD-10-CM | POA: Diagnosis not present

## 2019-07-29 DIAGNOSIS — R627 Adult failure to thrive: Secondary | ICD-10-CM | POA: Diagnosis not present

## 2019-07-29 NOTE — Progress Notes (Signed)
[facility] Consult Note Telephone: [phone]  Fax: [phone]  TELEHEALTH VISIT STATEMENT Due to the COVID-19 crisis, this visit was done via telemedicine from my office. It was initiated and consented to by this patient and/or family.  PATIENT NAME: [name] Foster DOB: [birth year] MRN: [id number]  PRIMARY CARE PROVIDER:   Cyndie Chime, MD, [place] [address] [phone]  REFERRING PROVIDER:  Cyndie Chime, [place],  [place] [address] [phone]  RESPONSIBLE PARTY:   Extended Emergency Contact Information Primary Emergency Contact: Weil,[name] J Address: [address]          [place], [place] [address] Johnnette Litter of [place] Phone: [phone] Mobile Phone: [phone] Relation: Spouse   ASSESSMENT AND RECOMMENDATIONS:   1. Advance Care Planning/Goals of Care: Goals include to maximize quality of life and symptom management.  MOST form introduced and left with NP signature. Will f/u on next visit. DNR is on Epic chart. I discussed disease process: they both maintain patient does not have RA and that his trouble began with methotrexate dosing. He appears to have had underlying autoimmune disease prior.   2. Symptom Management:   Mobility: [facility] PT for HEP and safety teaching.Wife states he had debility from methotrexate dose. He has walker and w/c. Denies falls.   Nutrition: Very poor, albumin 1.7. Family elected not to have PEG. Patient reports early satiety. We discussed additional protein powder, smaller meals. I offered megace, mirtazapine, etc but he did not want pills.  Meds reviewed. He is taking few now. Education given  3. Family /Caregiver/Community Supports: Has wife as daughter in [place]. No other caregiver. Has CNA for bathing during home health treatment. She is able to go out for limited errands with him settled with  phone at hand.  4. Cognitive / Functional decline: Cognitively appears good historian, but not talkative. They describe great functional decline from working in July (likely 70%) to 40 - 30% at this time.  5. Follow up Palliative Care Visit: Palliative care will continue to follow for goals of care clarification and symptom management. Return 3-4  weeks or prn.  I spent 40 minutes providing this consultation,  from 1230 to 1310. More than 50% of the time in this consultation was spent coordinating communication.   HISTORY OF PRESENT ILLNESS:  [name] Foster is a 78 y.o. year old male with multiple medical problems including autoimmune disease, anorexia, protein calorie malnutrition. Palliative Care was asked to follow this patient by consultation request of Toni Arthurs, NP to help address advance care planning and goals of care. This is a follow up visit.  CODE STATUS: DNR, MOST left in home  PPS: 40% HOSPICE ELIGIBILITY/DIAGNOSIS: yes/protein calorie malnutrition. Given weight loss and debility he would be a candidate if he is not choosing more interventive care.  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 Arthritis   . BPH (benign prostatic hyperplasia)   . Former smoker, stopped smoking many years ago   . History of needle biopsy of prostate with negative result 2013  . Hypertension   . Prostate enlargement   . Prostatic intraepithelial neoplasm III 2013    SOCIAL HX:  Social History   Tobacco Use  . Smoking status: Former Smoker    Quit date: 06/04/1980    Years since quitting: 39.1  . Smokeless tobacco:  Never Used  Substance Use Topics  . Alcohol use: No    ALLERGIES:  Allergies  Allergen Reactions  . Methotrexate     Other reaction(s): Other (See Comments) Weakness,      PERTINENT MEDICATIONS:  Outpatient Encounter Medications as of 07/29/2019  Medication Sig  . albuterol  (VENTOLIN HFA) 108 (90 Base) MCG/ACT inhaler Inhale 2 puffs into the lungs every 6 (six) hours as needed.  . docusate sodium (COLACE) 100 MG capsule Take 1 capsule (100 mg total) by mouth daily.  Marland Kitchen doxazosin (CARDURA) 4 MG tablet Take 1 tablet (4 mg total) by mouth daily.  . finasteride (PROSCAR) 5 MG tablet Take 1 tablet (5 mg total) by mouth daily.  . Multiple Vitamin (MULTIVITAMIN WITH MINERALS) TABS tablet Take 1 tablet by mouth daily.  . polyethylene glycol (MIRALAX / GLYCOLAX) 17 g packet Take 17 g by mouth daily as needed for mild constipation.  . feeding supplement, ENSURE ENLIVE, (ENSURE ENLIVE) LIQD Take 237 mLs by mouth 2 (two) times daily between meals. (Patient not taking: Reported on 07/19/2019)  . Fluticasone-Salmeterol (ADVAIR) 250-50 MCG/DOSE AEPB Inhale 1 puff into the lungs 2 (two) times a day.  . metoCLOPramide (REGLAN) 5 MG tablet Take 1 tablet (5 mg total) by mouth 3 (three) times daily with meals as needed for nausea or vomiting. (Patient not taking: Reported on 07/19/2019)  . OLANZapine (ZYPREXA) 2.5 MG tablet Take 1 tablet (2.5 mg total) by mouth at bedtime. (Patient not taking: Reported on 07/29/2019)  . pantoprazole (PROTONIX) 40 MG tablet Take 1 tablet (40 mg total) by mouth daily. (Patient not taking: Reported on 07/19/2019)   No facility-administered encounter medications on file as of 07/29/2019.     PHYSICAL EXAM / ROS:   Current and past weights: losing weights, 139 lbs few weeks ago. 154 lbs in July General: NAD, frail appearing,cachectic, states fatigue  Cardiovascular: no chest pain reported, 1+ left LE  edema, slight R edema Pulmonary: no cough, no increased SOB Abdomen: appetite fair,Early satiety reported.  occ. constipation, incontinent of bowel, H pylori treatment ending.  GU: denies dysuria, continent of urine MSK:  no joint deformities, ambulatory with walker, does not go out, has some equipment eg  raised toilet seat, does not want a hospital bed. Recent  recerted by PT for more home rehab Skin: no rashes or wounds reported Neurological: Weakness, sleep poor. Denies pain  Jason Coop, NP  COVID-19 PATIENT SCREENING TOOL  Person answering questions: _______patient____________ _____   1.  Is the patient or any family member in the home showing any signs or symptoms regarding respiratory infection?               Person with Symptom- ______NA_____________________  a. Fever                                                                          Yes___ No___          ___________________  b. Shortness of breath  Yes___ No___          ___________________ c. Cough/congestion                                       Yes___  No___         ___________________ d. Body aches/pains                                                         Yes___ No___        ____________________ e. Gastrointestinal symptoms (diarrhea, nausea)           Yes___ No___        ____________________  2. Within the past 14 days, has anyone living in the home had any contact with someone with or under investigation for COVID-19?    Yes___ No_x_   Person __________________

## 2019-08-02 DIAGNOSIS — R1310 Dysphagia, unspecified: Secondary | ICD-10-CM | POA: Diagnosis not present

## 2019-08-02 DIAGNOSIS — D539 Nutritional anemia, unspecified: Secondary | ICD-10-CM | POA: Diagnosis not present

## 2019-08-02 DIAGNOSIS — M6281 Muscle weakness (generalized): Secondary | ICD-10-CM | POA: Diagnosis not present

## 2019-08-02 DIAGNOSIS — J439 Emphysema, unspecified: Secondary | ICD-10-CM | POA: Diagnosis not present

## 2019-08-02 DIAGNOSIS — R627 Adult failure to thrive: Secondary | ICD-10-CM | POA: Diagnosis not present

## 2019-08-02 DIAGNOSIS — M0510 Rheumatoid lung disease with rheumatoid arthritis of unspecified site: Secondary | ICD-10-CM | POA: Diagnosis not present

## 2019-08-02 DIAGNOSIS — E43 Unspecified severe protein-calorie malnutrition: Secondary | ICD-10-CM | POA: Diagnosis not present

## 2019-08-02 DIAGNOSIS — I10 Essential (primary) hypertension: Secondary | ICD-10-CM | POA: Diagnosis not present

## 2019-08-02 DIAGNOSIS — D709 Neutropenia, unspecified: Secondary | ICD-10-CM | POA: Diagnosis not present

## 2019-08-03 ENCOUNTER — Other Ambulatory Visit: Payer: Self-pay | Admitting: Pulmonary Disease

## 2019-08-03 DIAGNOSIS — J849 Interstitial pulmonary disease, unspecified: Secondary | ICD-10-CM

## 2019-08-04 ENCOUNTER — Other Ambulatory Visit: Payer: Self-pay

## 2019-08-04 ENCOUNTER — Ambulatory Visit: Payer: Self-pay | Admitting: Oncology

## 2019-08-04 DIAGNOSIS — M6281 Muscle weakness (generalized): Secondary | ICD-10-CM | POA: Diagnosis not present

## 2019-08-04 DIAGNOSIS — M0510 Rheumatoid lung disease with rheumatoid arthritis of unspecified site: Secondary | ICD-10-CM | POA: Diagnosis not present

## 2019-08-04 DIAGNOSIS — D539 Nutritional anemia, unspecified: Secondary | ICD-10-CM | POA: Diagnosis not present

## 2019-08-04 DIAGNOSIS — D709 Neutropenia, unspecified: Secondary | ICD-10-CM | POA: Diagnosis not present

## 2019-08-04 DIAGNOSIS — I10 Essential (primary) hypertension: Secondary | ICD-10-CM | POA: Diagnosis not present

## 2019-08-04 DIAGNOSIS — R1310 Dysphagia, unspecified: Secondary | ICD-10-CM | POA: Diagnosis not present

## 2019-08-04 DIAGNOSIS — J439 Emphysema, unspecified: Secondary | ICD-10-CM | POA: Diagnosis not present

## 2019-08-04 DIAGNOSIS — E43 Unspecified severe protein-calorie malnutrition: Secondary | ICD-10-CM | POA: Diagnosis not present

## 2019-08-04 DIAGNOSIS — R627 Adult failure to thrive: Secondary | ICD-10-CM | POA: Diagnosis not present

## 2019-08-05 ENCOUNTER — Ambulatory Visit: Payer: Self-pay | Admitting: Oncology

## 2019-08-05 ENCOUNTER — Other Ambulatory Visit: Payer: Self-pay

## 2019-08-05 DIAGNOSIS — D539 Nutritional anemia, unspecified: Secondary | ICD-10-CM | POA: Diagnosis not present

## 2019-08-05 DIAGNOSIS — J439 Emphysema, unspecified: Secondary | ICD-10-CM | POA: Diagnosis not present

## 2019-08-05 DIAGNOSIS — R1310 Dysphagia, unspecified: Secondary | ICD-10-CM | POA: Diagnosis not present

## 2019-08-05 DIAGNOSIS — M0510 Rheumatoid lung disease with rheumatoid arthritis of unspecified site: Secondary | ICD-10-CM | POA: Diagnosis not present

## 2019-08-05 DIAGNOSIS — I10 Essential (primary) hypertension: Secondary | ICD-10-CM | POA: Diagnosis not present

## 2019-08-05 DIAGNOSIS — E43 Unspecified severe protein-calorie malnutrition: Secondary | ICD-10-CM | POA: Diagnosis not present

## 2019-08-05 DIAGNOSIS — R627 Adult failure to thrive: Secondary | ICD-10-CM | POA: Diagnosis not present

## 2019-08-05 DIAGNOSIS — D709 Neutropenia, unspecified: Secondary | ICD-10-CM | POA: Diagnosis not present

## 2019-08-05 DIAGNOSIS — M6281 Muscle weakness (generalized): Secondary | ICD-10-CM | POA: Diagnosis not present

## 2019-08-09 DIAGNOSIS — M0510 Rheumatoid lung disease with rheumatoid arthritis of unspecified site: Secondary | ICD-10-CM | POA: Diagnosis not present

## 2019-08-09 DIAGNOSIS — D709 Neutropenia, unspecified: Secondary | ICD-10-CM | POA: Diagnosis not present

## 2019-08-09 DIAGNOSIS — J439 Emphysema, unspecified: Secondary | ICD-10-CM | POA: Diagnosis not present

## 2019-08-09 DIAGNOSIS — R1310 Dysphagia, unspecified: Secondary | ICD-10-CM | POA: Diagnosis not present

## 2019-08-09 DIAGNOSIS — E43 Unspecified severe protein-calorie malnutrition: Secondary | ICD-10-CM | POA: Diagnosis not present

## 2019-08-09 DIAGNOSIS — M6281 Muscle weakness (generalized): Secondary | ICD-10-CM | POA: Diagnosis not present

## 2019-08-09 DIAGNOSIS — R627 Adult failure to thrive: Secondary | ICD-10-CM | POA: Diagnosis not present

## 2019-08-09 DIAGNOSIS — D539 Nutritional anemia, unspecified: Secondary | ICD-10-CM | POA: Diagnosis not present

## 2019-08-09 DIAGNOSIS — I10 Essential (primary) hypertension: Secondary | ICD-10-CM | POA: Diagnosis not present

## 2019-08-10 DIAGNOSIS — D709 Neutropenia, unspecified: Secondary | ICD-10-CM | POA: Diagnosis not present

## 2019-08-10 DIAGNOSIS — J439 Emphysema, unspecified: Secondary | ICD-10-CM | POA: Diagnosis not present

## 2019-08-10 DIAGNOSIS — R1310 Dysphagia, unspecified: Secondary | ICD-10-CM | POA: Diagnosis not present

## 2019-08-10 DIAGNOSIS — D539 Nutritional anemia, unspecified: Secondary | ICD-10-CM | POA: Diagnosis not present

## 2019-08-10 DIAGNOSIS — R627 Adult failure to thrive: Secondary | ICD-10-CM | POA: Diagnosis not present

## 2019-08-10 DIAGNOSIS — E43 Unspecified severe protein-calorie malnutrition: Secondary | ICD-10-CM | POA: Diagnosis not present

## 2019-08-10 DIAGNOSIS — I10 Essential (primary) hypertension: Secondary | ICD-10-CM | POA: Diagnosis not present

## 2019-08-10 DIAGNOSIS — M6281 Muscle weakness (generalized): Secondary | ICD-10-CM | POA: Diagnosis not present

## 2019-08-10 DIAGNOSIS — M0510 Rheumatoid lung disease with rheumatoid arthritis of unspecified site: Secondary | ICD-10-CM | POA: Diagnosis not present

## 2019-08-12 ENCOUNTER — Ambulatory Visit: Payer: Medicare HMO | Admitting: Urology

## 2019-08-12 DIAGNOSIS — M6281 Muscle weakness (generalized): Secondary | ICD-10-CM | POA: Diagnosis not present

## 2019-08-12 DIAGNOSIS — R1310 Dysphagia, unspecified: Secondary | ICD-10-CM | POA: Diagnosis not present

## 2019-08-12 DIAGNOSIS — R627 Adult failure to thrive: Secondary | ICD-10-CM | POA: Diagnosis not present

## 2019-08-12 DIAGNOSIS — J439 Emphysema, unspecified: Secondary | ICD-10-CM | POA: Diagnosis not present

## 2019-08-12 DIAGNOSIS — D539 Nutritional anemia, unspecified: Secondary | ICD-10-CM | POA: Diagnosis not present

## 2019-08-12 DIAGNOSIS — E43 Unspecified severe protein-calorie malnutrition: Secondary | ICD-10-CM | POA: Diagnosis not present

## 2019-08-12 DIAGNOSIS — I10 Essential (primary) hypertension: Secondary | ICD-10-CM | POA: Diagnosis not present

## 2019-08-12 DIAGNOSIS — M0510 Rheumatoid lung disease with rheumatoid arthritis of unspecified site: Secondary | ICD-10-CM | POA: Diagnosis not present

## 2019-08-12 DIAGNOSIS — D709 Neutropenia, unspecified: Secondary | ICD-10-CM | POA: Diagnosis not present

## 2019-08-14 DIAGNOSIS — M19012 Primary osteoarthritis, left shoulder: Secondary | ICD-10-CM | POA: Diagnosis not present

## 2019-08-14 DIAGNOSIS — M6281 Muscle weakness (generalized): Secondary | ICD-10-CM | POA: Diagnosis not present

## 2019-08-19 DIAGNOSIS — M6281 Muscle weakness (generalized): Secondary | ICD-10-CM | POA: Diagnosis not present

## 2019-08-19 DIAGNOSIS — M0510 Rheumatoid lung disease with rheumatoid arthritis of unspecified site: Secondary | ICD-10-CM | POA: Diagnosis not present

## 2019-08-19 DIAGNOSIS — J439 Emphysema, unspecified: Secondary | ICD-10-CM | POA: Diagnosis not present

## 2019-08-19 DIAGNOSIS — E43 Unspecified severe protein-calorie malnutrition: Secondary | ICD-10-CM | POA: Diagnosis not present

## 2019-08-19 DIAGNOSIS — R1310 Dysphagia, unspecified: Secondary | ICD-10-CM | POA: Diagnosis not present

## 2019-08-19 DIAGNOSIS — D539 Nutritional anemia, unspecified: Secondary | ICD-10-CM | POA: Diagnosis not present

## 2019-08-19 DIAGNOSIS — I10 Essential (primary) hypertension: Secondary | ICD-10-CM | POA: Diagnosis not present

## 2019-08-19 DIAGNOSIS — D709 Neutropenia, unspecified: Secondary | ICD-10-CM | POA: Diagnosis not present

## 2019-08-19 DIAGNOSIS — R627 Adult failure to thrive: Secondary | ICD-10-CM | POA: Diagnosis not present

## 2019-08-25 ENCOUNTER — Telehealth: Payer: Self-pay

## 2019-08-25 ENCOUNTER — Other Ambulatory Visit: Payer: Self-pay

## 2019-08-25 NOTE — Telephone Encounter (Signed)
Call made to patient, spoke with wife [name] (dpr), made aware I was contacting her to make her aware of this patient's covid testing.   Aware [facility]procedural [facility] and they need to arrive before 11am and to remain in the car. Voiced understanding. Confirmed appt for Chest CT as well as PFT location and times. Will place order for covid testing. Nothing further needed at this time.

## 2019-08-29 ENCOUNTER — Other Ambulatory Visit
Admission: RE | Admit: 2019-08-29 | Discharge: 2019-08-29 | Disposition: A | Discharge status: Home / Self Care | Payer: Medicare HMO | Source: Ambulatory Visit | Attending: Pulmonary Disease | Admitting: Pulmonary Disease

## 2019-08-29 ENCOUNTER — Other Ambulatory Visit: Payer: Self-pay

## 2019-08-29 DIAGNOSIS — Z01812 Encounter for preprocedural laboratory examination: Secondary | ICD-10-CM | POA: Diagnosis not present

## 2019-08-29 DIAGNOSIS — Z20828 Contact with and (suspected) exposure to other viral communicable diseases: Secondary | ICD-10-CM | POA: Diagnosis not present

## 2019-08-29 LAB — SARS CORONAVIRUS 2 (TAT 6-24 HRS): SARS Coronavirus 2: NEGATIVE

## 2019-08-30 ENCOUNTER — Ambulatory Visit (HOSPITAL_COMMUNITY): Payer: Medicare HMO

## 2019-08-30 ENCOUNTER — Ambulatory Visit
Admission: RE | Admit: 2019-08-30 | Discharge: 2019-08-30 | Disposition: A | Discharge status: Home / Self Care | Payer: Medicare HMO | Source: Ambulatory Visit | Attending: Pulmonary Disease | Admitting: Pulmonary Disease

## 2019-08-30 DIAGNOSIS — J849 Interstitial pulmonary disease, unspecified: Secondary | ICD-10-CM

## 2019-08-30 DIAGNOSIS — R911 Solitary pulmonary nodule: Secondary | ICD-10-CM | POA: Diagnosis not present

## 2019-08-30 DIAGNOSIS — J439 Emphysema, unspecified: Secondary | ICD-10-CM | POA: Diagnosis not present

## 2019-08-30 MED ORDER — ALBUTEROL SULFATE (2.5 MG/3ML) 0.083% IN NEBU
2.5000 mg | INHALATION_SOLUTION | Freq: Once | RESPIRATORY_TRACT | Status: AC
  Administered 2019-08-30: 2.5 mg via RESPIRATORY_TRACT
  Filled 2019-08-30: qty 3

## 2019-09-05 ENCOUNTER — Telehealth: Payer: Self-pay | Admitting: Primary Care

## 2019-09-05 ENCOUNTER — Ambulatory Visit: Payer: Medicare HMO | Admitting: Pulmonary Disease

## 2019-09-05 NOTE — Telephone Encounter (Signed)
T/c to family, booked appt for 10/29 at 10 am for palliative consultation.

## 2019-09-08 ENCOUNTER — Other Ambulatory Visit: Payer: Self-pay

## 2019-09-08 ENCOUNTER — Other Ambulatory Visit: Payer: Medicare HMO | Admitting: Primary Care

## 2019-09-08 ENCOUNTER — Other Ambulatory Visit: Payer: Self-pay | Admitting: Primary Care

## 2019-09-08 DIAGNOSIS — Z515 Encounter for palliative care: Secondary | ICD-10-CM | POA: Diagnosis not present

## 2019-09-08 NOTE — Progress Notes (Signed)
[facility] Consult Note Telephone: [phone]  Fax: [phone]  TELEHEALTH VISIT STATEMENT Due to the COVID-19 crisis, this visit was done via telemedicine from my office. It was initiated and consented to by this patient and/or family.  PATIENT NAME: [name] AYSON [place] [address] [phone] (home)  DOB: [birth year] MRN: [id number]  PRIMARY CARE PROVIDER:   Toni Arthurs, NP, [place] [address] [phone]  REFERRING PROVIDER:  Toni Arthurs, NP [place],  [place] [address] [phone]  RESPONSIBLE PARTY:   Extended Emergency Contact Information Primary Emergency Contact: Luna Kitchens Address: [address]          [place], [place] [address] Johnnette Litter of [place] Phone: [phone] Mobile Phone: [phone] Relation: Spouse   ASSESSMENT AND RECOMMENDATIONS:   1. Advance Care Planning/Goals of Care: Goals include to maximize quality of life and symptom management. Patient is declining in function and intake. Patient has DNR on record. Discussed case with PCP NP Littie Deeds. We discussed hospice admission and requirements for six month prognosis.  We discussed pills being a swallowing burden.  We discussed no more pills, but he did not refuse medications in liquid.We discussed SSRI, remeron or depakote liquid. I outlined the indications for hospice admission, and that he was eligible for this service at this time due to his progressive functional loss and continued weight loss and poor intake. He and his wife sat in silence a few minutes, and then he said yes. Wife is tearful. I asked them to discuss with their family and I will also discuss with PCP and hospice Medical director, and next week we'd go forward if all are in agreement. PCP in agreement to be attending and had referred for evaluation to hospice several months ago; family had declined.   2. Symptom Management:   Mobility:  Not able to transfer without assistance, not walking. Toilet elevation seat is wobbly, we discussed having additional DME if hospice services begin.  Nutrition: Intake is very poor, taking crackers and soup and one ensure a day.He refuses to take more when asked. Latest albumin was 1.7, unchanged from a month previous. Wife will encourage him to take more liquid nutrition. At this point he is getting < 500 calories a day.He continues to adamantly refuse a feeding tube or any "more pills".  Depression: Appears significant with little positive outlook or expectations. I discussed Rx with PCP who was in agreement. I will send sertraline 20 mg/ml 40 mg for use in clear liquids. Wife states she would like the option to pursue this treatment.  Constipation: Recent loose stools, reports former constipation, May have impaction but not able to assess. Will continue to monitor. Due for a stool sample as test for cure for H Pylori. Wife will t/c MD office to see if they can bring it to the office.  3. Family /Caregiver/Community Supports: Lives with wife who is caregiver, has adult children who assist. Has extended family.   4. Cognitive / Functional decline: Alert and oriented, insisting he does not want aggressive care. Very declined physically, cachectic and cannot ambulate now.  5. Follow up Palliative Care Visit: Palliative care will continue to follow for goals of care clarification and symptom management. Return 1 week or prn.  I spent 60 minutes providing this consultation,  from 1030 to 1130. More than 50% of the time in this consultation was spent coordinating communication.   HISTORY  OF PRESENT ILLNESS:  [name] Foster is a 78 y.o. year old male with multiple medical problems including protein calorie malnutrition, BPH, COPD. Palliative Care was asked to follow this patient by consultation request of Toni Arthurs, NP to help address advance care planning  and goals of care. This is a follow up visit.  CODE STATUS: DNR  PPS: 30% HOSPICE ELIGIBILITY/DIAGNOSIS: yes/protein calorie malnutrition  PAST MEDICAL HISTORY:  Past Medical History:  Diagnosis Date  . Arthritis   . BPH (benign prostatic hyperplasia)   . Former smoker, stopped smoking many years ago   . History of needle biopsy of prostate with negative result 2013  . Hypertension   . Prostate enlargement   . Prostatic intraepithelial neoplasm III 2013    SOCIAL HX:  Social History   Tobacco Use  . Smoking status: Former Smoker    Quit date: 06/04/1980    Years since quitting: 39.2  . Smokeless tobacco: Never Used  Substance Use Topics  . Alcohol use: No    ALLERGIES:  Allergies  Allergen Reactions  . Methotrexate     Other reaction(s): Other (See Comments) Weakness,      PERTINENT MEDICATIONS:  Outpatient Encounter Medications as of 09/08/2019  Medication Sig  . albuterol (VENTOLIN HFA) 108 (90 Base) MCG/ACT inhaler Inhale 2 puffs into the lungs every 6 (six) hours as needed.  . docusate sodium (COLACE) 100 MG capsule Take 1 capsule (100 mg total) by mouth daily.  Marland Kitchen doxazosin (CARDURA) 4 MG tablet Take 1 tablet (4 mg total) by mouth daily.  . feeding supplement, ENSURE ENLIVE, (ENSURE ENLIVE) LIQD Take 237 mLs by mouth 2 (two) times daily between meals. (Patient not taking: Reported on 07/19/2019)  . finasteride (PROSCAR) 5 MG tablet Take 1 tablet (5 mg total) by mouth daily.  . Fluticasone-Salmeterol (ADVAIR) 250-50 MCG/DOSE AEPB Inhale 1 puff into the lungs 2 (two) times a day.  . metoCLOPramide (REGLAN) 5 MG tablet Take 1 tablet (5 mg total) by mouth 3 (three) times daily with meals as needed for nausea or vomiting. (Patient not taking: Reported on 07/19/2019)  . Multiple Vitamin (MULTIVITAMIN WITH MINERALS) TABS tablet Take 1 tablet by mouth daily.  Marland Kitchen OLANZapine (ZYPREXA) 2.5 MG tablet Take 1 tablet (2.5 mg total) by mouth at bedtime. (Patient not taking: Reported on  07/29/2019)  . pantoprazole (PROTONIX) 40 MG tablet Take 1 tablet (40 mg total) by mouth daily. (Patient not taking: Reported on 07/19/2019)  . polyethylene glycol (MIRALAX / GLYCOLAX) 17 g packet Take 17 g by mouth daily as needed for mild constipation.   No facility-administered encounter medications on file as of 09/08/2019.     PHYSICAL EXAM / ROS:   Current and past weights:  Wife thinks under 100 lbs, continues to lose weight.Very cachectic. General: NAD, frail appearing, thin Cardiovascular: no chest pain reported, no edema, S1 S2 Pulmonary: no cough, no increased SOB, lungs clear Abdomen: appetite very  Poor,  Loose bowels, denies recent constipation,  occ incontinent of bowel.  GU: denies dysuria, continent of urine, hesitency MSK:  no joint deformities, non  Ambulatory, pivot transfers now Skin: no rashes or wounds reported Neurological: Weakness, losing a lot of strength, sleeps poorly, 3 hrs a night, sleeps in chair. Sleep is poor. Patient mood is depressed  Cyndia Skeeters DNP AGPCNP-BC  COVID-19 PATIENT SCREENING TOOL  Person answering questions: _______wife____________ _____   1.  Is the patient or any family member in the home showing any signs or  symptoms regarding respiratory infection?               Person with Symptom- ___________na________________  a. Fever                                                                          Yes___ No___          ___________________  b. Shortness of breath                                                    Yes___ No___          ___________________ c. Cough/congestion                                       Yes___  No___         ___________________ d. Body aches/pains                                                         Yes___ No___        ____________________ e. Gastrointestinal symptoms (diarrhea, nausea)           Yes___ No___        ____________________  2. Within the past 14 days, has anyone living in the home had any  contact with someone with or under investigation for COVID-19?    Yes___ No_x_   Person __________________

## 2019-09-08 NOTE — Progress Notes (Signed)
Entered in error. Sertraline concentrate 40 mg daily , diluted,escribed.

## 2019-09-14 DIAGNOSIS — M6281 Muscle weakness (generalized): Secondary | ICD-10-CM | POA: Diagnosis not present

## 2019-09-14 DIAGNOSIS — M19012 Primary osteoarthritis, left shoulder: Secondary | ICD-10-CM | POA: Diagnosis not present

## 2019-09-15 ENCOUNTER — Other Ambulatory Visit: Payer: Self-pay

## 2019-09-15 ENCOUNTER — Other Ambulatory Visit: Payer: Medicare HMO | Admitting: Primary Care

## 2019-09-15 DIAGNOSIS — Z515 Encounter for palliative care: Secondary | ICD-10-CM

## 2019-09-15 NOTE — Progress Notes (Signed)
[facility] Consult Note Telephone: [phone]  Fax: [phone]  PATIENT NAME: [name] Foster [place] [address] [phone] (home)  DOB: 1941-09-20 MRN: [id number]  PRIMARY CARE PROVIDER:   Toni Arthurs, NP, [place] [address] [phone]  REFERRING PROVIDER:  Toni Arthurs, NP [place],  [place] [address] [phone]  RESPONSIBLE PARTY:   Extended Emergency Contact Information Primary Emergency Contact: Luna Kitchens Address: [address]          [place], [place] [address] Johnnette Litter of [place] Phone: [phone] Mobile Phone: [phone] Relation: Spouse   ASSESSMENT AND RECOMMENDATIONS:   1. Advance Care Planning/Goals of Care: Goals include to maximize quality of life and symptom management. I discussed hospice with family last week. Wife states extended family  had a meeting and decided not to pursue hospice in case he had to go to the hospital. I clarified him being able to go to the hospital if needed, and would also have 24/7 nursing. They felt they didn't need any equipment or supplies. Wife wondered about sitting service, clarified for agencies who do sitting service. I reiterated services and asked them to call if they changed their mind.  2. Symptom Management:   Anorexia;  Continues to eat poorly, ensure x 1 daily. Very cachectic.   Mobility: Lying down today due to  Fatigue. Using w/c  To go to den and has bsc.   Pain: None  Depression:  Treating with sertraline. Anniversary is 11/28. We discussed this milestone, 48 years of marriage.  3. Family /Caregiver/Community Supports:  Family in the area. Wife is caregiver.   4. Cognitive / Functional decline: Cognition at recent baseline.Losing physical function, increased fatigue.  5. Follow up Palliative Care Visit: Palliative care will continue to follow for goals of care  clarification and symptom management. Return 4 weeks or prn.  I spent 40 minutes providing this consultation,  from 1035 to 1115. More than 50% of the time in this consultation was spent coordinating communication.   HISTORY OF PRESENT ILLNESS:  [name] Foster is a 78 y.o. year old male with multiple medical problems including anorexia, wt loss, fatigue. Palliative Care was asked to follow this patient by consultation request of Toni Arthurs, NP to help address advance care planning and goals of care. This is a follow up visit.  CODE STATUS: MOST given, not yet completed by family.  PPS: 30% (weak) HOSPICE ELIGIBILITY/DIAGNOSIS: yes/protein calorie malnutrition  PAST MEDICAL HISTORY:  Past Medical History:  Diagnosis Date  . Arthritis   . BPH (benign prostatic hyperplasia)   . Former smoker, stopped smoking many years ago   . History of needle biopsy of prostate with negative result 2013  . Hypertension   . Prostate enlargement   . Prostatic intraepithelial neoplasm III 2013    SOCIAL HX:  Social History   Tobacco Use  . Smoking status: Former Smoker    Quit date: 06/04/1980    Years since quitting: 39.3  . Smokeless tobacco: Never Used  Substance Use Topics  . Alcohol use: No    ALLERGIES:  Allergies  Allergen Reactions  . Methotrexate     Other reaction(s): Other (See Comments) Weakness,      PERTINENT MEDICATIONS:  Outpatient Encounter Medications as of 09/15/2019  Medication Sig  . albuterol (VENTOLIN HFA) 108 (90 Base) MCG/ACT inhaler Inhale 2 puffs into the lungs every 6 (six) hours as needed.  Marland Kitchen  docusate sodium (COLACE) 100 MG capsule Take 1 capsule (100 mg total) by mouth daily. (Patient not taking: Reported on 09/08/2019)  . doxazosin (CARDURA) 4 MG tablet Take 1 tablet (4 mg total) by mouth daily. (Patient not taking: Reported on 09/08/2019)  . feeding supplement, ENSURE ENLIVE, (ENSURE ENLIVE) LIQD Take 237 mLs by mouth 2 (two) times daily between meals.   . finasteride (PROSCAR) 5 MG tablet Take 1 tablet (5 mg total) by mouth daily.  . Fluticasone-Salmeterol (ADVAIR) 250-50 MCG/DOSE AEPB Inhale 1 puff into the lungs 2 (two) times a day.  . metoCLOPramide (REGLAN) 5 MG tablet Take 1 tablet (5 mg total) by mouth 3 (three) times daily with meals as needed for nausea or vomiting. (Patient not taking: Reported on 07/19/2019)  . Multiple Vitamin (MULTIVITAMIN WITH MINERALS) TABS tablet Take 1 tablet by mouth daily.  Marland Kitchen OLANZapine (ZYPREXA) 2.5 MG tablet Take 1 tablet (2.5 mg total) by mouth at bedtime. (Patient not taking: Reported on 07/29/2019)  . pantoprazole (PROTONIX) 40 MG tablet Take 1 tablet (40 mg total) by mouth daily. (Patient not taking: Reported on 07/19/2019)  . polyethylene glycol (MIRALAX / GLYCOLAX) 17 g packet Take 17 g by mouth daily as needed for mild constipation. (Patient not taking: Reported on 09/08/2019)   No facility-administered encounter medications on file as of 09/15/2019.     PHYSICAL EXAM / ROS:   104/65 75 18 Current and past weights:  Unavailable this week  General: NAD, frail appearing, thin, denies pain Cardiovascular: no chest pain reported, no edema,  Pulmonary: no cough, no increased SOB, occ DOE, lungs clear to all fields Abdomen: appetite fair, denies constipation, continent of bowel, active bowel sounds GU: denies dysuria, continent of urine MSK:  no joint deformities, non ambulatory, no falls Skin: no rashes or wounds reported Neurological: Weakness, sleep 2-3 hours, stays awake during day.  [place]  COVID-19 PATIENT SCREENING TOOL  Person answering questions: _____Rose______________ _____   1.  Is the patient or any family member in the home showing any signs or symptoms regarding respiratory infection?               Person with Symptom- ________na___________________  a. Fever                                                                          Yes___ No___           ___________________  b. Shortness of breath                                                    Yes___ No___          ___________________ c. Cough/congestion                                       Yes___  No___         ___________________ d. Body aches/pains  Yes___ No___        ____________________ e. Gastrointestinal symptoms (diarrhea, nausea)           Yes___ No___        ____________________  2. Within the past 14 days, has anyone living in the home had any contact with someone with or under investigation for COVID-19?    Yes___ No_x_   Person __________________

## 2019-09-17 ENCOUNTER — Emergency Department
Admission: EM | Admit: 2019-09-17 | Discharge: 2019-09-17 | Disposition: A | Discharge status: Home / Self Care | Payer: Medicare HMO | Attending: Emergency Medicine | Admitting: Emergency Medicine

## 2019-09-17 ENCOUNTER — Emergency Department: Payer: Medicare HMO

## 2019-09-17 ENCOUNTER — Other Ambulatory Visit: Payer: Self-pay

## 2019-09-17 DIAGNOSIS — R531 Weakness: Secondary | ICD-10-CM

## 2019-09-17 DIAGNOSIS — K5900 Constipation, unspecified: Secondary | ICD-10-CM | POA: Diagnosis not present

## 2019-09-17 DIAGNOSIS — I10 Essential (primary) hypertension: Secondary | ICD-10-CM | POA: Insufficient documentation

## 2019-09-17 DIAGNOSIS — Z87891 Personal history of nicotine dependence: Secondary | ICD-10-CM | POA: Diagnosis not present

## 2019-09-17 DIAGNOSIS — R630 Anorexia: Secondary | ICD-10-CM | POA: Diagnosis not present

## 2019-09-17 DIAGNOSIS — Z79899 Other long term (current) drug therapy: Secondary | ICD-10-CM | POA: Insufficient documentation

## 2019-09-17 DIAGNOSIS — Z96643 Presence of artificial hip joint, bilateral: Secondary | ICD-10-CM | POA: Insufficient documentation

## 2019-09-17 LAB — CBC WITH DIFFERENTIAL/PLATELET
Abs Immature Granulocytes: 0.01 10*3/uL (ref 0.00–0.07)
Basophils Absolute: 0 10*3/uL (ref 0.0–0.1)
Basophils Relative: 0 %
Eosinophils Absolute: 0 10*3/uL (ref 0.0–0.5)
Eosinophils Relative: 0 %
HCT: 27.8 % — ABNORMAL LOW (ref 39.0–52.0)
Hemoglobin: 8.3 g/dL — ABNORMAL LOW (ref 13.0–17.0)
Immature Granulocytes: 0 %
Lymphocytes Relative: 24 %
Lymphs Abs: 1.1 10*3/uL (ref 0.7–4.0)
MCH: 23.7 pg — ABNORMAL LOW (ref 26.0–34.0)
MCHC: 29.9 g/dL — ABNORMAL LOW (ref 30.0–36.0)
MCV: 79.4 fL — ABNORMAL LOW (ref 80.0–100.0)
Monocytes Absolute: 0.3 10*3/uL (ref 0.1–1.0)
Monocytes Relative: 7 %
Neutro Abs: 3.2 10*3/uL (ref 1.7–7.7)
Neutrophils Relative %: 69 %
Platelets: 508 10*3/uL — ABNORMAL HIGH (ref 150–400)
RBC: 3.5 MIL/uL — ABNORMAL LOW (ref 4.22–5.81)
RDW: 18.3 % — ABNORMAL HIGH (ref 11.5–15.5)
WBC: 4.6 10*3/uL (ref 4.0–10.5)
nRBC: 0 % (ref 0.0–0.2)

## 2019-09-17 LAB — BASIC METABOLIC PANEL
Anion gap: 14 (ref 5–15)
BUN: 10 mg/dL (ref 8–23)
CO2: 28 mmol/L (ref 22–32)
Calcium: 8.1 mg/dL — ABNORMAL LOW (ref 8.9–10.3)
Chloride: 88 mmol/L — ABNORMAL LOW (ref 98–111)
Creatinine, Ser: 0.72 mg/dL (ref 0.61–1.24)
GFR calc Af Amer: >60 mL/min (ref >60)
GFR calc non Af Amer: >60 mL/min (ref >60)
Glucose, Bld: 125 mg/dL — ABNORMAL HIGH (ref 70–99)
Potassium: 3.2 mmol/L — ABNORMAL LOW (ref 3.5–5.1)
Sodium: 130 mmol/L — ABNORMAL LOW (ref 135–145)

## 2019-09-17 LAB — LACTIC ACID, PLASMA
Lactic Acid, Venous: 2.6 mmol/L (ref 0.5–1.9)
Lactic Acid, Venous: 1.9 mmol/L (ref 0.5–1.9)

## 2019-09-17 LAB — URINALYSIS, COMPLETE (UACMP) WITH MICROSCOPIC
Bilirubin Urine: NEGATIVE
Glucose, UA: NEGATIVE mg/dL
Hgb urine dipstick: NEGATIVE
Ketones, ur: NEGATIVE mg/dL
Leukocytes,Ua: NEGATIVE
Nitrite: NEGATIVE
Protein, ur: 30 mg/dL — AB
Specific Gravity, Urine: 1.016 (ref 1.005–1.030)
pH: 6 (ref 5.0–8.0)

## 2019-09-17 LAB — TROPONIN I (HIGH SENSITIVITY)
Troponin I (High Sensitivity): 60 ng/L — ABNORMAL HIGH (ref <18)
Troponin I (High Sensitivity): 54 ng/L — ABNORMAL HIGH (ref <18)

## 2019-09-17 MED ORDER — SODIUM CHLORIDE 0.9 % IV BOLUS
1000.0000 mL | Freq: Once | INTRAVENOUS | Status: AC
  Administered 2019-09-17: 1000 mL via INTRAVENOUS

## 2019-09-17 NOTE — ED Notes (Signed)
Pt had small loose stool.  Pt cleaned up and repositioned in bed.

## 2019-09-17 NOTE — ED Notes (Signed)
Pt was able to tolerate > half bag of soap suds enema with some loose light brown liquid stool.

## 2019-09-17 NOTE — Discharge Instructions (Addendum)
Please seek medical attention for any high fevers, chest pain, shortness of breath, change in behavior, persistent vomiting, bloody stool or any other new or concerning symptoms.  

## 2019-09-17 NOTE — ED Notes (Signed)
Pt's family member at bedside.  She states pt has been very weak for a long time, pt has had decreased appetite and not eating or drinking much.  Pt is leaning to his right side and appears to be very weak and unable to sit up.

## 2019-09-17 NOTE — ED Notes (Signed)
IV team at bedside 

## 2019-09-17 NOTE — ED Provider Notes (Signed)
[facility] Emergency Department Provider Note   ____________________________________________   I have reviewed the triage vital signs and the nursing notes.   HISTORY  Chief Complaint Weakness   History primary obtained from family   HPI [name] Foster is a 78 y.o. male who presents to the emergency department today brought in by family because of concern for weakness and constipation. Per family the patient has been having issues with weakness since July. They have been evaluated for this in the past. Family says that they are also concerned about constipation. Patient had a small, watery bowel movement 4 days ago and nothing since. Patient has not had any fevers.    Records reviewed. Per medical record review patient has a history of declining status for a couple of months. Has been seen by palliative care.   Past Medical History:  Diagnosis Date  . Arthritis   . BPH (benign prostatic hyperplasia)   . Former smoker, stopped smoking many years ago   . History of needle biopsy of prostate with negative result 2013  . Hypertension   . Prostate enlargement   . Prostatic intraepithelial neoplasm III 2013    Patient Active Problem List   Diagnosis Date Noted  . Protein-calorie malnutrition, severe 06/26/2019  . Normocytic anemia 06/24/2019  . Thrombocytosis ([place]) 06/24/2019  . Other dysphagia 06/24/2019  . Dysphagia 06/24/2019  . Palliative care by specialist 06/02/2019  . Iron deficiency anemia 06/02/2019  . 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 Generalized weakness 06/02/2019  . 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 Pneumonia 05/25/2019  . Sepsis ([place])   . Goals of care, counseling/discussion   . DNR (do not resuscitate) discussion   . Atypical pneumonia   . H/O bilateral hip replacements 04/26/2019  . 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 Incomplete emptying of bladder  02/03/2017  . 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 Abnormal finding on chest xray 07/18/2013  . Chronic cough 07/18/2013  . Neutropenia ([place]) 09/03/2012  . Tests ordered 09/03/2012  . 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 Benign localized prostatic hyperplasia with lower urinary tract symptoms (LUTS) 07/07/2012  . Elevated prostate specific antigen (PSA) 07/07/2012    Past Surgical History:  Procedure Laterality Date  . ESOPHAGOGASTRODUODENOSCOPY N/A 06/26/2019   Procedure: ESOPHAGOGASTRODUODENOSCOPY (EGD);  Surgeon: Lin Landsman, MD;  Location: [facility] ENDOSCOPY;  Service: Gastroenterology;  Laterality: N/A;  . HEMORROIDECTOMY    . HERNIA REPAIR    . JOINT REPLACEMENT Left    THR  . PROSTATE BIOPSY  2013  . TOTAL HIP ARTHROPLASTY Left   . TOTAL HIP ARTHROPLASTY Right 04/26/2019   Procedure: TOTAL HIP ARTHROPLASTY ANTERIOR APPROACH;  Surgeon: Hessie Knows, MD;  Location: [facility] ORS;  Service: Orthopedics;  Laterality: Right;    Prior to Admission medications   Medication Sig Start Date End Date Taking? Authorizing Provider  albuterol (VENTOLIN HFA) 108 (90 Base) MCG/ACT inhaler Inhale 2 puffs into the lungs every 6 (six) hours as needed. 05/23/19   [provider]  docusate sodium (COLACE) 100 MG capsule Take 1 capsule (100 mg total) by mouth daily. Patient not taking: Reported on 09/08/2019 06/29/19   Nicholes Mango, MD  doxazosin (CARDURA) 4 MG tablet Take 1 tablet (4 mg total) by mouth daily. Patient not taking: Reported on 09/08/2019 07/14/19   [name], MD  feeding supplement, ENSURE ENLIVE, (ENSURE ENLIVE) LIQD Take 237 mLs by mouth  2 (two) times daily between meals. 06/30/19   Nicholes Mango, MD  finasteride (PROSCAR) 5 MG tablet Take 1 tablet (5 mg total) by mouth daily. 07/14/19   Stoioff, Ronda Fairly, MD  Fluticasone-Salmeterol (ADVAIR) 250-50 MCG/DOSE AEPB Inhale 1 puff into the lungs 2 (two) times a day. 06/02/19 06/01/20   [provider]  metoCLOPramide (REGLAN) 5 MG tablet Take 1 tablet (5 mg total) by mouth 3 (three) times daily with meals as needed for nausea or vomiting. Patient not taking: Reported on 07/19/2019 06/29/19 06/28/20  Nicholes Mango, MD  Multiple Vitamin (MULTIVITAMIN WITH MINERALS) TABS tablet Take 1 tablet by mouth daily. 06/30/19   Gouru, Illene Silver, MD  OLANZapine (ZYPREXA) 2.5 MG tablet Take 1 tablet (2.5 mg total) by mouth at bedtime. Patient not taking: Reported on 07/29/2019 06/29/19   Nicholes Mango, MD  pantoprazole (PROTONIX) 40 MG tablet Take 1 tablet (40 mg total) by mouth daily. Patient not taking: Reported on 07/19/2019 06/30/19   Nicholes Mango, MD  polyethylene glycol (MIRALAX / GLYCOLAX) 17 g packet Take 17 g by mouth daily as needed for mild constipation. Patient not taking: Reported on 09/08/2019 06/29/19   Nicholes Mango, MD    Allergies Methotrexate  Family History  Problem Relation Age of Onset  . Prostate cancer Father   . Cancer Father   . Prostate cancer Brother   . Cancer Mother     Social History Social History   Tobacco Use  . Smoking status: Former Smoker    Quit date: 06/04/1980    Years since quitting: 39.3  . Smokeless tobacco: Never Used  Substance Use Topics  . Alcohol use: No  . Drug use: No    Review of Systems Constitutional: Positive for generalized weakness.  Eyes: No visual changes. ENT: No sore throat. Cardiovascular: Denies chest pain. Respiratory: Denies shortness of breath. Gastrointestinal: Positive for constipation.  Genitourinary: Negative for dysuria. Musculoskeletal: Negative for back pain. Skin: Negative for rash. Neurological: Negative for headaches, focal weakness or numbness.  ____________________________________________   PHYSICAL EXAM:  VITAL SIGNS: ED Triage Vitals  Enc Vitals Group     BP 09/17/19 1537 (!) 85/59     Pulse Rate 09/17/19 1537 (!) 106     Resp 09/17/19 1552 16     Temp 09/17/19 1537 97.7 F (36.5 C)      Temp Source 09/17/19 1537 Oral     SpO2 09/17/19 1537 95 %     Weight 09/17/19 1547 115 lb (52.2 kg)     Height 09/17/19 1547 5\' 8"  (1.727 m)     Head Circumference --      Peak Flow --      Pain Score 09/17/19 1546 0   Constitutional: Awake and alert.  Eyes: Conjunctivae are normal.  ENT      Head: Normocephalic and atraumatic.      Nose: No congestion/rhinnorhea.      Mouth/Throat: Mucous membranes are moist.      Neck: No stridor. Hematological/Lymphatic/Immunilogical: No cervical lymphadenopathy. Cardiovascular: Normal rate, regular rhythm.  No murmurs, rubs, or gallops.  Respiratory: Normal respiratory effort without tachypnea nor retractions. Breath sounds are clear and equal bilaterally. No wheezes/rales/rhonchi. Gastrointestinal: Soft and non tender. No rebound. No guarding.  Genitourinary: Deferred Musculoskeletal: Normal range of motion in all extremities. No lower extremity edema. Neurologic:  Normal speech and language. No gross focal neurologic deficits are appreciated.  Skin:  Skin is warm, dry and intact. No rash noted. Psychiatric: Mood and affect are normal.  Speech and behavior are normal. Patient exhibits appropriate insight and judgment.  ____________________________________________    LABS (pertinent positives/negatives)  Lactic acid 2.6 Trop hs 60 ->54 UA clear, protein 30, rare bacteria BMP na 130, k 3.2, glu 125, cr 0.72 CBC wbc 4.6, hgb 8.3, plt 508 ____________________________________________   EKG  I, Nance Pear, attending physician, personally viewed and interpreted this EKG  EKG Time: 1550 Rate: 87 Rhythm: sinus rhythm Axis: normal Intervals: qtc 449 QRS: narrow ST changes: no st elevation Impression: normal ekg   ____________________________________________    RADIOLOGY  CXR No evidence of cardiopulmonary  disease  ____________________________________________   PROCEDURES  Procedures  ____________________________________________   INITIAL IMPRESSION / ASSESSMENT AND PLAN / ED COURSE  Pertinent labs & imaging results that were available during my care of the patient were reviewed by me and considered in my medical decision making (see chart for details).   Presented to the emergency department today because of concerns for weakness.  Per family this has been going on since July.  Does not appear that today's episode was significantly different from previous episodes of weakness.  Patient's work-up here without obvious cause.  Initial troponin was slightly elevated however repeat had improved.  Given that this is gone on since July and there is no concerning EKG changes or chest pain I doubt ACS.  He did seem to improve somewhat with fluids.  Additional concern for constipation was present.  Patient was given an enema and family states that he had a good output.  Will plan on discharging to continue follow-up with primary care. ____________________________________________   FINAL CLINICAL IMPRESSION(S) / ED DIAGNOSES  Final diagnoses:  Weakness     Note: This dictation was prepared with Dragon dictation. Any transcriptional errors that result from this process are unintentional     Nance Pear, MD 09/17/19 2322

## 2019-09-17 NOTE — ED Notes (Addendum)
Pt given urinal to collect specimen

## 2019-09-17 NOTE — ED Notes (Signed)
Called lab to request they come draw blood cultures.  Myself and another RN have tried multiple times to draw blood for labs.

## 2019-09-17 NOTE — ED Notes (Signed)
Date and time results received: 09/17/19  5:04 PM  Test: Lactic Critical Value: 2.6  Name of Provider Notified: Archie Balboa MD

## 2019-09-17 NOTE — ED Notes (Signed)
Lab at bedside and updated on all blood needed for pt

## 2019-09-17 NOTE — ED Triage Notes (Signed)
Pt arrived via [facility] from home.  Weakness started 3 days ago.  EMS reported hypotension.

## 2019-09-17 NOTE — ED Notes (Signed)
Patient aware that we need urine sample for testing, unable at this time. Pt given instruction on providing urine sample when able to do so.   

## 2019-09-19 LAB — TYPE AND SCREEN
ABO/RH(D): AB POS
Antibody Screen: NEGATIVE

## 2019-09-22 LAB — CULTURE, BLOOD (ROUTINE X 2)
Culture: NO GROWTH
Culture: NO GROWTH
Special Requests: ADEQUATE
Special Requests: ADEQUATE

## 2019-09-29 DIAGNOSIS — R1312 Dysphagia, oropharyngeal phase: Secondary | ICD-10-CM | POA: Diagnosis not present

## 2019-10-04 ENCOUNTER — Telehealth: Payer: Self-pay | Admitting: Primary Care

## 2019-10-04 NOTE — Telephone Encounter (Signed)
T/c to wife, to check on patient. Patient is doing ok for now, eating some better. I will t/c in several weeks to schedule. Wife instructed to phone if needed.

## 2019-10-13 DIAGNOSIS — R531 Weakness: Secondary | ICD-10-CM | POA: Diagnosis not present

## 2019-10-14 DIAGNOSIS — M6281 Muscle weakness (generalized): Secondary | ICD-10-CM | POA: Diagnosis not present

## 2019-10-14 DIAGNOSIS — M19012 Primary osteoarthritis, left shoulder: Secondary | ICD-10-CM | POA: Diagnosis not present

## 2019-11-14 DIAGNOSIS — M19012 Primary osteoarthritis, left shoulder: Secondary | ICD-10-CM | POA: Diagnosis not present

## 2019-11-14 DIAGNOSIS — M6281 Muscle weakness (generalized): Secondary | ICD-10-CM | POA: Diagnosis not present

## 2019-12-07 DIAGNOSIS — H15102 Unspecified episcleritis, left eye: Secondary | ICD-10-CM | POA: Diagnosis not present

## 2019-12-13 ENCOUNTER — Telehealth: Payer: Self-pay | Admitting: Primary Care

## 2019-12-13 NOTE — Telephone Encounter (Signed)
T/c to make HV appointment.

## 2019-12-15 DIAGNOSIS — M19012 Primary osteoarthritis, left shoulder: Secondary | ICD-10-CM | POA: Diagnosis not present

## 2019-12-15 DIAGNOSIS — M6281 Muscle weakness (generalized): Secondary | ICD-10-CM | POA: Diagnosis not present

## 2019-12-22 ENCOUNTER — Other Ambulatory Visit: Payer: Medicare HMO | Admitting: Primary Care

## 2019-12-22 ENCOUNTER — Other Ambulatory Visit: Payer: Self-pay

## 2019-12-22 DIAGNOSIS — Z515 Encounter for palliative care: Secondary | ICD-10-CM | POA: Diagnosis not present

## 2019-12-22 NOTE — Progress Notes (Signed)
[facility] Consult Note Telephone: [phone]  Fax: [phone] .  PATIENT NAME: [name] Foster [id number] [phone] (home)  DOB: 1941/02/12 MRN: [id number]  PRIMARY CARE PROVIDER:   Toni Arthurs, NP, [place] [address] [phone]  REFERRING PROVIDER:  Toni Arthurs, NP [place],  [place] [address] [phone]  RESPONSIBLE PARTY:   Extended Emergency Contact Information Primary Emergency Contact: Luna Kitchens Address: [address]          [place], [place] [address] Johnnette Litter of [place] Phone: [phone] Mobile Phone: [phone] Relation: Spouse   ASSESSMENT AND RECOMMENDATIONS:   1. Advance Care Planning/Goals of Care: Goals include to maximize quality of life and symptom management. States he is better, eating better. Had gotten down to 87 lbs but now has gained back to baseline. They attribute this improvement to providence.  2. Symptom Management:   Pruritis of back: Denies rash, states night time pruritis and insomnia once this starts. Takes some day time naps.  I  sent in hydroxyzine 25 mg for nocturnal pruritis. He will try it out. I also suggested S[place].   Depression: Wife had given 2 mos of sertraline with good results. However, was not able to obtain a refill and now declines using it. She does think it helped him to begin eating again and to improve in his function. I asked her to let me know if they want to begin it again and that I did suggest ongoing use.  Nutrition: Wife had picture of patient at 37 lbs in nov, now at 127. He looks much better with more energy. He is eating well. He is taking a MVI.  3. Family /Caregiver/Community Supports:   [place] Daughter helps when she can from [place]  4. Cognitive / Functional decline:  Alert, oriented, x 3. Declines to take most  medications. Able to do some adls and iadls.   5. Follow up Palliative Care Visit: Palliative care will continue to follow for goals of care clarification and symptom management. Return 8 weeks or prn.  I spent 40 minutes providing this consultation,  from 1215 to 1255. More than 50% of the time in this consultation was spent coordinating communication.   HISTORY OF PRESENT ILLNESS:  [name] Foster is a 79 y.o. year old male with multiple medical problems including weakness, protein calorie malnutrition. Palliative Care was asked to follow this patient by consultation request of Toni Arthurs, NP to help address advance care planning and goals of care. This is a follow up visit.  CODE STATUS: DNR  PPS: 40% HOSPICE ELIGIBILITY/DIAGNOSIS: TBD  PAST MEDICAL HISTORY:  Past Medical History:  Diagnosis Date  . Arthritis   . BPH (benign prostatic hyperplasia)   . Former smoker, stopped smoking many years ago   . History of needle biopsy of prostate with negative result 2013  . Hypertension   . Prostate enlargement   . Prostatic intraepithelial neoplasm III 2013    SOCIAL HX:  Social History   Tobacco Use  . Smoking status: Former Smoker    Quit date: 06/04/1980    Years since quitting: 39.5  . Smokeless tobacco: Never Used  Substance Use Topics  . Alcohol use: No    ALLERGIES:  Allergies  Allergen Reactions  . Methotrexate     Other reaction(s): Other (See Comments) Weakness,      PERTINENT  MEDICATIONS:  Outpatient Encounter Medications as of 12/22/2019  Medication Sig  . finasteride (PROSCAR) 5 MG tablet Take 1 tablet (5 mg total) by mouth daily.  . Multiple Vitamin (MULTIVITAMIN WITH MINERALS) TABS tablet Take 1 tablet by mouth daily.   No facility-administered encounter medications on file as of 12/22/2019.    PHYSICAL EXAM / ROS:   Current and past weights: 127 lbs now with home scale.  General: NAD, frail appearing, thin Cardiovascular: no chest pain reported,  no edema Pulmonary: no cough, no increased SOB, room air Abdomen: appetite good, states eats 100%, endorses constipation, incontinent of bowel GU: denies dysuria, incontinent of urine MSK:  no joint deformities, ambulatory with walker or cane  Skin: no rashes or wounds reported Neurological: Weakness, sleep is poor, sleep recently has been poor.  States itching back.  Jason Coop, NP  COVID-19 PATIENT SCREENING TOOL  Person answering questions: _____________Rosey______ _____   1.  Is the patient or any family member in the home showing any signs or symptoms regarding respiratory infection?               Person with Symptom- ___________________________  a. Fever                                                                          Yes___ No___          ___________________  b. Shortness of breath                                                    Yes___ No___          ___________________ c. Cough/congestion                                       Yes___  No___         ___________________ d. Body aches/pains                                                         Yes___ No___        ____________________ e. Gastrointestinal symptoms (diarrhea, nausea)           Yes___ No___        ____________________  2. Within the past 14 days, has anyone living in the home had any contact with someone with or under investigation for COVID-19?    Yes___ No_x_   Person __________________

## 2019-12-26 DIAGNOSIS — H15102 Unspecified episcleritis, left eye: Secondary | ICD-10-CM | POA: Diagnosis not present

## 2020-01-03 ENCOUNTER — Telehealth: Payer: Self-pay | Admitting: Primary Care

## 2020-01-06 DIAGNOSIS — H15102 Unspecified episcleritis, left eye: Secondary | ICD-10-CM | POA: Diagnosis not present

## 2020-01-12 DIAGNOSIS — M6281 Muscle weakness (generalized): Secondary | ICD-10-CM | POA: Diagnosis not present

## 2020-01-12 DIAGNOSIS — M19012 Primary osteoarthritis, left shoulder: Secondary | ICD-10-CM | POA: Diagnosis not present

## 2020-01-12 NOTE — Telephone Encounter (Signed)
Wife called to request zoloft refill. Sent.

## 2020-02-12 DIAGNOSIS — M19012 Primary osteoarthritis, left shoulder: Secondary | ICD-10-CM | POA: Diagnosis not present

## 2020-02-12 DIAGNOSIS — M6281 Muscle weakness (generalized): Secondary | ICD-10-CM | POA: Diagnosis not present

## 2020-02-13 DIAGNOSIS — H15103 Unspecified episcleritis, bilateral: Secondary | ICD-10-CM | POA: Diagnosis not present

## 2020-02-22 DIAGNOSIS — H15103 Unspecified episcleritis, bilateral: Secondary | ICD-10-CM | POA: Diagnosis not present

## 2020-02-27 NOTE — Progress Notes (Signed)
Patient ID: [name] Foster, male    DOB: [birth year], 79 y.o.   MRN: [id number]  PCP: Towanda Malkin, MD  Chief Complaint  Patient presents with  . New Patient (Initial Visit)  . Establish Care    Subjective:   [name] Foster is a 79 y.o. male, presents to clinic with CC of the following:  Chief Complaint  Patient presents with  . New Patient (Initial Visit)  . Establish Care    HPI:  Patient is a 79 y.o. male who presents today new to the practice.  Wife is with patient today.  Patient wearing a [employer].  His last assessment was a home care visit (palliative care encounter) on 12/22/2019 and goals included maximize quality of life and symptom management.  Symptoms included persistent dysphagia, failure to thrive, persistent weight loss, protein calorie malnutrition, peristent generalized weakness.  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 Methotrexate was taken twice only. Patient was then evaluated in the emergency department 05/30/2019 for weakness and constipation. Lab test were unremarkable (no acute findings): EKG, chest x-ray,  lab test with no major finding, had chronic thrombocytosis, persistent normocytic anemia, creatinine was stable at 0.72 with a GFR above 60, patient was discharged home. Patient was admitted to the hospital from August 14-19/2020 for failure to thrive, weight loss, generalized weakness and dysphagia. He was evaluated in the hospital by Dr. Cynda Familia rheumatologist , Dr. Mcarthur Rossetti (neurology) and gastroenterology post EGD with Dobbhoff tube placed on June 26, 2019. EGD was also positive for H. pylori gastritis treated with clarithromycin  amoxicillin and omeprazole.  Patient did not follow-up with neurology after his discharge from the hospital.  He had follow-up after by several teams including oncology ( July 19, 2019 Dr. Talbert Cage and Dr. Janese Banks ) and Pulmonology.  He has been under palliative care since 07/04/2019 requested by Bridgette (daughter and patient's wife ). Wife stated that the family did not opt for Hospice care.  Palliative Care was asked to follow this patient by consultation request of Toni Arthurs, NP.  The wife had noted that before his decline, he was very healthy, and no history of diabetes, high blood pressure, or other significant chronic medical problems.  At that f/u visit in Feb - noted he was better, eating better. Had gotten down to 87 lbs at the lowest, and the wife showed me a picture of him at that time, and it was scary picture of a malnourished individual.  He has continued to improve, and today when asked, he states he is doing well.  His appetite continues to be good, his weight has continued to increase, he has progressed from a walker to a cane to using nothing to ambulate, and is doing well getting around.  He has had no falls.  He still is slightly slower in walking, mostly for safety issues.  He notes occasional joint pains in the hands, nothing of great concern, and not limiting to him.  His wife notes he is doing more around the house, does not like to be waited on all the time, and helping more with some chores. And comparing his present appearance to that picture is amazing.  F/U on oncerns noted at that visit bu  palliative care in Feb: Pruritis of back: Denied rash, states night time pruritis and that has since resolved.  Depression: Wife had given 2 mos of sertraline with good results, but then was not able to obtain a refill, and continued with out.  The patient noted today he has no feelings of sadness, no depression concerns, with his PHQ-9 reviewed which was negative.  The wife is in  agreement with that sentiment.   Nutrition: patient now at 150 lbs.. Has more energy. He is eating well.  Denies any problems swallowing.  He is taking a MVI.  Cognitive / Functional decline:  Alert, oriented, x 3.  Very appropriate in conversation.  Very pleasant.  Has slowly resumed to being more active, and helping with some activities around the house as noted above.  Follow up Palliative Care Visit: Palliative care was not all that helpful per the patient's wife, very limited in any regular evaluations, and she is to call them when she gets back from our visit today, and will be canceling with no further visits from them.  [place] Daughter helps when she can from [place].  He has a Medical history of prostatic intraepithelial neoplasm III -  diagnosed in 2013, he did have a biopsy, with the patient noting that it was okay, and no aggressive treatment is recommended after.  Both him and the wife did not feel like there was a cancer diagnosis in the past.  His PSAs were also elevated at that time. He presently denies any urgency, hesitancy, frequency, up at most 1-2 times a night and he thinks related to what he consumes. Lab Results  Component Value Date   PSA1 1.9 08/06/2018    Prior right hip replacement June 2020, left hip replacement many years ago,   seropositive (high titers)-CCP negative RA diagnosed 04/2019 (treatted with 2 doses of 10 mg weekly Methotrexate only) was noted. At a rheumatology visit in November, patient could not talk clearly, he could not get up, he had lost a lot of weight, and he could not swallow, Rheumatology definitely pushed to have the patient being evaluated by neurology, concerned about serious neurologic conditions like myasthenia gravis/ ALS/MS (although was not done)  Last labs were in Nov 2020  He did have a visit in Jan and 2 in Feb 2021 for episcleritis management as his eyes have been red, and currently taking an  ointment for this.  Also was prescribed a naproxen product and a PPI in combination to protect his stomach.  He denies any pain, and thinks that the eye redness is improving some.  Also notes his vision has been good, he does wear glasses. He denies any marked itchiness of the eyes, has a little nose running in the past week, and his eye doctor questioned if some allergies may be contributing and to consider an antihistamine. He continues to follow with the eye doctor presently.   Patient Active Problem List   Diagnosis Date Noted  . Protein-calorie malnutrition, severe 06/26/2019  . Normocytic anemia 06/24/2019  . Thrombocytosis ([place]) 06/24/2019  . Other dysphagia 06/24/2019  . Dysphagia 06/24/2019  . Palliative care by specialist 06/02/2019  . Iron deficiency anemia 06/02/2019  . 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 Generalized weakness 06/02/2019  . 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 Pneumonia 05/25/2019  . Sepsis ([place])   . Goals of care, counseling/discussion   . DNR (do not resuscitate) discussion   . Atypical pneumonia   .  H/O bilateral hip replacements 04/26/2019  . 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 Seropositive rheumatoid arthritis ([place]) 04/21/2019  . Incomplete emptying of bladder 02/03/2017  . 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 Abnormal finding on chest xray 07/18/2013  . Chronic cough 07/18/2013  . Neutropenia ([place]) 09/03/2012  . Tests ordered 09/03/2012  . 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 Benign localized prostatic hyperplasia with lower urinary tract symptoms (LUTS) 07/07/2012  . Elevated prostate specific antigen (PSA) 07/07/2012      Current Outpatient Medications:  Marland Kitchen  Multiple Vitamin (MULTIVITAMIN WITH MINERALS) TABS tablet, Take 1 tablet by mouth daily., Disp:  , Rfl:  .  naproxen (NAPROSYN) 500 MG tablet, SMARTSIG:1 Tablet(s) By Mouth Twice Daily, Disp: , Rfl:  .   neomycin-polymyxin-dexameth (MAXITROL) 0.1 % OINT, 1 application., Disp: , Rfl:  .  pantoprazole (PROTONIX) 20 MG tablet, Take 20 mg by mouth 2 (two) times daily., Disp: , Rfl:    Allergies  Allergen Reactions  . Methotrexate     Other reaction(s): Other (See Comments) Weakness,      Past Surgical History:  Procedure Laterality Date  . ESOPHAGOGASTRODUODENOSCOPY N/A 06/26/2019   Procedure: ESOPHAGOGASTRODUODENOSCOPY (EGD);  Surgeon: Lin Landsman, MD;  Location: [facility] ENDOSCOPY;  Service: Gastroenterology;  Laterality: N/A;  . HEMORROIDECTOMY    . HERNIA REPAIR    . JOINT REPLACEMENT Left    THR  . PROSTATE BIOPSY  2013  . TOTAL HIP ARTHROPLASTY Left   . TOTAL HIP ARTHROPLASTY Right 04/26/2019   Procedure: TOTAL HIP ARTHROPLASTY ANTERIOR APPROACH;  Surgeon: Hessie Knows, MD;  Location: [facility] ORS;  Service: Orthopedics;  Laterality: Right;     Family History  Problem Relation Age of Onset  . Prostate cancer Father   . Cancer Father   . Prostate cancer Brother   . Cancer Mother      Social History   Tobacco Use  . Smoking status: Former Smoker    Quit date: 06/04/1980    Years since quitting: 39.7  . Smokeless tobacco: Never Used  Substance Use Topics  . Alcohol use: No    With staff assistance, above reviewed with the patient/wife today.  ROS: As per HPI, otherwise no specific complaints on a limited and focused system review   No results found for this or any previous visit (from the past 72 hour(s)).   PHQ2/9: Depression screen [facility] 2/9 02/28/2020  Decreased Interest 0  Down, Depressed, Hopeless 0  PHQ - 2 Score 0  Altered sleeping 0  Tired, decreased energy 0  Change in appetite 0  Feeling bad or failure about yourself  0  Trouble concentrating 0  Moving slowly or fidgety/restless 0  Suicidal thoughts 0  PHQ-9 Score 0  Difficult doing work/chores Not difficult at all   PHQ-2/9 Result is neg  Fall Risk: Fall Risk  02/28/2020  Falls in the past  year? 1  Number falls in past yr: 1  Injury with Fall? 0      Objective:   Vitals:   02/28/20 1307  BP: 120/78  Pulse: 99  Resp: 14  Temp: (!) 97.1 F (36.2 C)  TempSrc: Temporal  SpO2: 99%  Weight: 150 lb 3.2 oz (68.1 kg)  Height: 5\' 8"  (1.727 m)    Body mass index is 22.84 kg/m.  Physical Exam   NAD, masked, pleasant HEENT - [place]/AT, sclera anicteric, positive glasses, PERRL, EOMI, conj -diffusely inj'ed bilaterally, TM's and canals clear, pharynx clear  Neck - supple, no adenopathy, no TM, carotids 2+ and = without bruits bilat Car - RRR without m/g/r Pulm- RR and effort normal at rest, CTA without wheeze or rales Abd - soft, NT, ND, BS+,  no masses, no HSM Back - no CVA tenderness Skin- no rash noted on exposed areas,  Ext - no LE edema, no active joints GU - prostate exam deferred today, denies any recent concerning symptoms.  We will start with a PSA check again, and likely a prostate exam on a follow-up visit will be needed, and possibly a urology referral given his history over time. Neuro/psychiatric - affect was not flat, appropriate with conversation  Alert and oriented  Grossly non-focal - adequate strength on testing extremities, sensation intact to LT in distal extremities, had mild left pronator drift, adequate finger-to-nose (uses middle finger and finger-to-nose testing), slow and deliberate rapid alternating movements, DTRs were 2+ and equal in the patella  Speech normal   Results for orders placed or performed during the hospital encounter of 09/17/19  Blood culture (routine x 2)   Specimen: BLOOD  Result Value Ref Range   Specimen Description BLOOD BLOOD RIGHT WRIST    Special Requests      BOTTLES DRAWN AEROBIC AND ANAEROBIC Blood Culture adequate volume   Culture      NO GROWTH 5 DAYS Performed at [facility], [address] [place], [place] [address]    Report Status 09/22/2019 FINAL   Blood culture (routine x 2)   Specimen: BLOOD    Result Value Ref Range   Specimen Description BLOOD BLOOD RIGHT HAND    Special Requests      BOTTLES DRAWN AEROBIC AND ANAEROBIC Blood Culture adequate volume   Culture      NO GROWTH 5 DAYS Performed at [facility], [address] [place], [place] [address]    Report Status 09/22/2019 FINAL   Lactic acid, plasma  Result Value Ref Range   Lactic Acid, Venous 2.6 (HH) 0.5 - 1.9 mmol/L  Lactic acid, plasma  Result Value Ref Range   Lactic Acid, Venous 1.9 0.5 - 1.9 mmol/L  CBC with Differential  Result Value Ref Range   WBC 4.6 4.0 - 10.5 K/uL   RBC 3.50 (L) 4.22 - 5.81 MIL/uL   Hemoglobin 8.3 (L) 13.0 - 17.0 g/dL   HCT 27.8 (L) 39.0 - 52.0 %   MCV 79.4 (L) 80.0 - 100.0 fL   MCH 23.7 (L) 26.0 - 34.0 pg   MCHC 29.9 (L) 30.0 - 36.0 g/dL   RDW 18.3 (H) 11.5 - 15.5 %   Platelets 508 (H) 150 - 400 K/uL   nRBC 0.0 0.0 - 0.2 %   Neutrophils Relative % 69 %   Neutro Abs 3.2 1.7 - 7.7 K/uL   Lymphocytes Relative 24 %   Lymphs Abs 1.1 0.7 - 4.0 K/uL   Monocytes Relative 7 %   Monocytes Absolute 0.3 0.1 - 1.0 K/uL   Eosinophils Relative 0 %   Eosinophils Absolute 0.0 0.0 - 0.5 K/uL   Basophils Relative 0 %   Basophils Absolute 0.0 0.0 - 0.1 K/uL   Immature Granulocytes 0 %   Abs Immature Granulocytes 0.01 0.00 - 0.07 K/uL  Basic metabolic panel  Result Value Ref Range   Sodium 130 (L) 135 - 145 mmol/L   Potassium 3.2 (L) 3.5 - 5.1 mmol/L   Chloride 88 (L) 98 - 111 mmol/L   CO2 28 22 - 32 mmol/L  Glucose, Bld 125 (H) 70 - 99 mg/dL   BUN 10 8 - 23 mg/dL   Creatinine, Ser 0.72 0.61 - 1.24 mg/dL   Calcium 8.1 (L) 8.9 - 10.3 mg/dL   GFR calc non Af Amer >60 >60 mL/min   GFR calc Af Amer >60 >60 mL/min   Anion gap 14 5 - 15  Urinalysis, Complete w Microscopic  Result Value Ref Range   Color, Urine YELLOW (A) YELLOW   APPearance CLEAR (A) CLEAR   Specific Gravity, Urine 1.016 1.005 - 1.030   pH 6.0 5.0 - 8.0   Glucose, UA NEGATIVE NEGATIVE mg/dL   Hgb urine  dipstick NEGATIVE NEGATIVE   Bilirubin Urine NEGATIVE NEGATIVE   Ketones, ur NEGATIVE NEGATIVE mg/dL   Protein, ur 30 (A) NEGATIVE mg/dL   Nitrite NEGATIVE NEGATIVE   Leukocytes,Ua NEGATIVE NEGATIVE   RBC / HPF 0-5 0 - 5 RBC/hpf   WBC, UA 0-5 0 - 5 WBC/hpf   Bacteria, UA RARE (A) NONE SEEN   Squamous Epithelial / LPF 0-5 0 - 5   Mucus PRESENT   Type and screen [place]  Result Value Ref Range   ABO/RH(D) AB POS    Antibody Screen NEG    Sample Expiration      09/20/2019,2359 Performed at [facility], [address] [place], [place] [address]   Troponin I (High Sensitivity)  Result Value Ref Range   Troponin I (High Sensitivity) 60 (H) <18 ng/L  Troponin I (High Sensitivity)  Result Value Ref Range   Troponin I (High Sensitivity) 54 (H) <18 ng/L       Assessment & Plan:   A large amount of time was spent reviewing the patient's chart with respect to the previous history and providers involved in the patient's care in the recent past.  1. Normocytic anemia Patient was anemic on last labs, although that was a long time ago. We will recheck labs today. - CBC with Differential/Platelet - COMPLETE METABOLIC PANEL WITH GFR  2. Seropositive rheumatoid arthritis ([place]) He has a history of this, although he really seemed to decline after this diagnosis and methotrexate was started briefly.  He states that he is not limited now from any joint pains in his hands, and both him and his wife are adamant about not returning to rheumatologist presently. - COMPLETE METABOLIC PANEL WITH GFR  3. PIN III (prostatic intraepithelial neoplasm III) Details of this are still not totally clear, with  prior urology notes stating carcinoma in situ of the prostate.  He also has a family history of a brother and father with prostate cancer noted. We will check a PSA today, and he denies any concerning symptoms here recently.  He had been on medication to manage LUTS in  the past as well. Await that result, and as noted above, a probable DRE on a future visit or possibly urology involvement again  - PSA - Urinalysis, Complete  4. Thrombocytosis ([place]) History of this on last labs a while ago, and will recheck a CBC today.  5. Elevated prostate specific antigen (PSA) Had a history of this in the past when followed by urology, over 4 at one point, although then decreased, with his last check in 2019 as noted. Recheck today.  6. History of malnutrition He was extremely malnourished, with the picture shown by his wife documenting this.  His nutritional status has slowly improved over time, and is also clinically improved.  He denies any dysphagia,  his appetite is good. We will check some labs today. - COMPLETE METABOLIC PANEL WITH GFR  7. History of depression Denied any depressive symptoms today, and is doing quite well.  Had been on a Zoloft product at one point, I feel no need to resume that presently.  8. Episcleritis of both eyes Has been followed by the eye doctor, and will continue presently as still has evidence of this.  Is currently utilizing a ointment to apply.  The wife related the story of being discharged from the hospital, and strongly encouraged to enroll in hospice with her husband's declining noted at that time.  There was no obvious source found for his decline, and remarkably, he has slowly improved in the recent past. I do feel it is reasonable to stop the palliative care visits as they plan to do so today after this visit, and await labs to be checked today. We will follow-up again in approximately 6 weeks time, sooner as needed.  Towanda Malkin, MD 02/28/20 1:21 PM

## 2020-02-28 ENCOUNTER — Ambulatory Visit (INDEPENDENT_AMBULATORY_CARE_PROVIDER_SITE_OTHER): Payer: Medicare HMO | Admitting: Internal Medicine

## 2020-02-28 ENCOUNTER — Encounter: Payer: Self-pay | Admitting: Internal Medicine

## 2020-02-28 ENCOUNTER — Other Ambulatory Visit: Payer: Self-pay

## 2020-02-28 VITALS — BP 120/78 | HR 99 | Temp 97.1°F | Resp 14 | Ht 68.0 in | Wt 150.2 lb

## 2020-02-28 DIAGNOSIS — D075 Carcinoma in situ of prostate: Secondary | ICD-10-CM | POA: Diagnosis not present

## 2020-02-28 DIAGNOSIS — R972 Elevated prostate specific antigen [PSA]: Secondary | ICD-10-CM | POA: Diagnosis not present

## 2020-02-28 DIAGNOSIS — M059 Rheumatoid arthritis with rheumatoid factor, unspecified: Secondary | ICD-10-CM

## 2020-02-28 DIAGNOSIS — D649 Anemia, unspecified: Secondary | ICD-10-CM | POA: Diagnosis not present

## 2020-02-28 DIAGNOSIS — Z8639 Personal history of other endocrine, nutritional and metabolic disease: Secondary | ICD-10-CM | POA: Diagnosis not present

## 2020-02-28 DIAGNOSIS — D473 Essential (hemorrhagic) thrombocythemia: Secondary | ICD-10-CM

## 2020-02-28 DIAGNOSIS — D75839 Thrombocytosis, unspecified: Secondary | ICD-10-CM

## 2020-02-28 DIAGNOSIS — H15103 Unspecified episcleritis, bilateral: Secondary | ICD-10-CM

## 2020-02-28 DIAGNOSIS — Z8659 Personal history of other mental and behavioral disorders: Secondary | ICD-10-CM

## 2020-02-29 LAB — CBC WITH DIFFERENTIAL/PLATELET
Absolute Monocytes: 361 cells/uL (ref 200–950)
Basophils Absolute: 22 cells/uL (ref 0–200)
Basophils Relative: 0.5 %
Eosinophils Absolute: 119 cells/uL (ref 15–500)
Eosinophils Relative: 2.7 %
HCT: 29.4 % — ABNORMAL LOW (ref 38.5–50.0)
Hemoglobin: 9.3 g/dL — ABNORMAL LOW (ref 13.2–17.1)
Lymphs Abs: 1615 cells/uL (ref 850–3900)
MCH: 25.3 pg — ABNORMAL LOW (ref 27.0–33.0)
MCHC: 31.6 g/dL — ABNORMAL LOW (ref 32.0–36.0)
MCV: 79.9 fL — ABNORMAL LOW (ref 80.0–100.0)
MPV: 9.6 fL (ref 7.5–12.5)
Monocytes Relative: 8.2 %
Neutro Abs: 2284 cells/uL (ref 1500–7800)
Neutrophils Relative %: 51.9 %
Platelets: 417 10*3/uL — ABNORMAL HIGH (ref 140–400)
RBC: 3.68 10*6/uL — ABNORMAL LOW (ref 4.20–5.80)
RDW: 16.5 % — ABNORMAL HIGH (ref 11.0–15.0)
Total Lymphocyte: 36.7 %
WBC: 4.4 10*3/uL (ref 3.8–10.8)

## 2020-02-29 LAB — URINALYSIS, COMPLETE
Bacteria, UA: NONE SEEN /HPF
Bilirubin Urine: NEGATIVE
Glucose, UA: NEGATIVE
Hyaline Cast: NONE SEEN /LPF
Nitrite: NEGATIVE
Specific Gravity, Urine: 1.037 — ABNORMAL HIGH (ref 1.001–1.03)
pH: <=5 (ref 5.0–8.0)

## 2020-02-29 LAB — COMPLETE METABOLIC PANEL WITH GFR
AG Ratio: 0.6 (calc) — ABNORMAL LOW (ref 1.0–2.5)
ALT: 11 U/L (ref 9–46)
AST: 17 U/L (ref 10–35)
Albumin: 2.9 g/dL — ABNORMAL LOW (ref 3.6–5.1)
Alkaline phosphatase (APISO): 97 U/L (ref 35–144)
BUN/Creatinine Ratio: 21 (calc) (ref 6–22)
BUN: 14 mg/dL (ref 7–25)
CO2: 30 mmol/L (ref 20–32)
Calcium: 8.7 mg/dL (ref 8.6–10.3)
Chloride: 100 mmol/L (ref 98–110)
Creat: 0.67 mg/dL — ABNORMAL LOW (ref 0.70–1.18)
GFR, Est African American: 107 mL/min/{1.73_m2} (ref >=60)
GFR, Est Non African American: 92 mL/min/{1.73_m2} (ref >=60)
Globulin: 5.1 g/dL (calc) — ABNORMAL HIGH (ref 1.9–3.7)
Glucose, Bld: 126 mg/dL — ABNORMAL HIGH (ref 65–99)
Potassium: 4.1 mmol/L (ref 3.5–5.3)
Sodium: 137 mmol/L (ref 135–146)
Total Bilirubin: 0.2 mg/dL (ref 0.2–1.2)
Total Protein: 8 g/dL (ref 6.1–8.1)

## 2020-02-29 LAB — PSA: PSA: 2.7 ng/mL (ref <=4.0)

## 2020-03-07 DIAGNOSIS — H15103 Unspecified episcleritis, bilateral: Secondary | ICD-10-CM | POA: Diagnosis not present

## 2020-03-12 ENCOUNTER — Telehealth: Payer: Self-pay | Admitting: Internal Medicine

## 2020-03-12 NOTE — Telephone Encounter (Signed)
[facility] is currently providing palliative care with pt. They would like to have the okay to continue care ?    CB: [id number] opt 2.

## 2020-03-13 DIAGNOSIS — M19012 Primary osteoarthritis, left shoulder: Secondary | ICD-10-CM | POA: Diagnosis not present

## 2020-03-13 DIAGNOSIS — M6281 Muscle weakness (generalized): Secondary | ICD-10-CM | POA: Diagnosis not present

## 2020-03-13 NOTE — Telephone Encounter (Signed)
I spoke with [facility] and gave the ok to continue with palliative care Per Dr. Roxan Hockey.

## 2020-03-13 NOTE — Telephone Encounter (Signed)
Ok by me

## 2020-03-14 DIAGNOSIS — H15103 Unspecified episcleritis, bilateral: Secondary | ICD-10-CM | POA: Diagnosis not present

## 2020-03-20 ENCOUNTER — Telehealth: Payer: Self-pay | Admitting: Primary Care

## 2020-03-20 NOTE — Telephone Encounter (Signed)
T/c to [name] Foster, she states they would like to cease palliative services. Discharged from service.

## 2020-03-26 ENCOUNTER — Other Ambulatory Visit: Payer: Medicare HMO | Admitting: Primary Care

## 2020-03-28 ENCOUNTER — Telehealth: Payer: Self-pay

## 2020-03-28 NOTE — Telephone Encounter (Signed)
Volunteer support call made, message left

## 2020-04-03 ENCOUNTER — Telehealth: Payer: Self-pay | Admitting: Internal Medicine

## 2020-04-03 NOTE — Telephone Encounter (Signed)
Left message for patient to schedule Annual Wellness Visit.  Please schedule with Nurse Health Advisor KASEY UTHUS, RN at [facility].   ° °

## 2020-04-04 DIAGNOSIS — Z113 Encounter for screening for infections with a predominantly sexual mode of transmission: Secondary | ICD-10-CM | POA: Diagnosis not present

## 2020-04-04 DIAGNOSIS — H3321 Serous retinal detachment, right eye: Secondary | ICD-10-CM | POA: Diagnosis not present

## 2020-04-04 DIAGNOSIS — H15023 Brawny scleritis, bilateral: Secondary | ICD-10-CM | POA: Diagnosis not present

## 2020-04-04 DIAGNOSIS — H2513 Age-related nuclear cataract, bilateral: Secondary | ICD-10-CM | POA: Diagnosis not present

## 2020-04-04 DIAGNOSIS — H5462 Unqualified visual loss, left eye, normal vision right eye: Secondary | ICD-10-CM | POA: Diagnosis not present

## 2020-04-04 DIAGNOSIS — H35712 Central serous chorioretinopathy, left eye: Secondary | ICD-10-CM | POA: Diagnosis not present

## 2020-04-10 NOTE — Progress Notes (Signed)
Patient ID: [name] Foster, male    DOB: 06-26-1941, 79 y.o.   MRN: [id number]  PCP: Towanda Malkin, MD  Chief Complaint  Patient presents with  . Follow-up    f/u on labs and progress  . Spot on chest    left side, onset a couple of weeks    Subjective:   [name] Foster is a 79 y.o. male, presents to clinic with CC of the following:  Chief Complaint  Patient presents with  . Follow-up    f/u on labs and progress  . Spot on chest    left side, onset a couple of weeks    HPI:  Patient is a 79 y.o. male who established with the practice 02/28/20 after prolonged hospitalization, with that last note reviewed. Wife discharged palliative care services 03/20/20. Follows up today with his wife present for the visit The computer system went down shortly after his visit started, with this note then completed later in the day after it was restored  At our last visit he noted he was better, eating better. (Had gotten down to 87 lbs at the lowest, and the wife showed me a picture of him at that time, and it was scary picture of a malnourished individual).  He has continued to improve, and today when asked, he states he is doing well.  His appetite continues to be good, his weight has continued to increase.  He is doing well getting around, still careful when he first arises and gets study before he takes off as recommended. He has had no falls.  He still notes occasional joint pains in the hands, still struggles to open bottles and grip things, and does feel like he is getting a little stronger. He noted some swelling in his elbows, first the left elbow, more fluidlike swelling at the end of the elbow and then it developed on the right as well.  It was not painful.  He notes that swelling has gone down again. He also notes there is a spot on his chest, which can occasionally itch.  It has been there for a week or 2.  He has tried not to scratch at it. He notes the only  medicine he takes presently is a multivitamin.  Normocytic anemia Patient was anemic in the past. Most recent CBC -showed with stable to slightly improving Lab Results  Component Value Date   WBC 4.4 02/28/2020   HGB 9.3 (L) 02/28/2020   HCT 29.4 (L) 02/28/2020   MCV 79.9 (L) 02/28/2020   PLT 417 (H) 02/28/2020  Denies CP, SOB, marked fatigue, black stools or BRBPR, no other bleeding concerns,   Seropositive rheumatoid arthritis ([place]) He has a history of this, although he really seemed to decline after this diagnosis and methotrexate was started briefly.   He states that he is not markedly limited now from any joint pains in his hands, and both him and his wife noted last visit that they were adamant about not returning to rheumatologist, and confirmed that again today.  PIN III (prostatic intraepithelial neoplasm III)/Elevated PSA prior urology notes stating carcinoma in situ of the prostate.  He also has a family history of a brother and father with prostate cancer noted. he denies any concerning symptoms here recently.  He had been on medication to manage LUTS in the past. PSA checked with last visit was 2.7 Lab Results  Component Value Date   PSA1 1.9 08/06/2018   PSA  2.7 02/28/2020    Thrombocytosis (HCC) Lab Results  Component Value Date   WBC 4.4 02/28/2020   HGB 9.3 (L) 02/28/2020   HCT 29.4 (L) 02/28/2020   MCV 79.9 (L) 02/28/2020   PLT 417 (H) 02/28/2020  Following presently  History of malnutrition He was extremely malnourished, with the picture shown by his wife last visit documenting this.   Last labs with evidence for this also His nutritional status has slowly improved over time, and is also clinically improved.   He denies any dysphagia, his appetite is good.  History of depression Denied any depressive symptoms today, and is doing quite well.   Had been on a Zoloft product at one point, with no need to resume that presently  Episcleritis of both  eyes Has been followed by the eye doctor in [place], with repetitive visits and drops prescribed to help.  He more recently went to an eye doctor in the [place] system, and they were very concerned with more being needed, and they have a follow-up again tomorrow, with likely procedures needed per their history.  Concerns were noted for possible loss of vision in 1 eye, and plans are to continue to follow-up with the eye doctors in the [place] system presently.  Prior right hip replacement June 2020, left hip replacement many years ago,  Patient Active Problem List   Diagnosis Date Noted  . Episcleritis of both eyes 02/28/2020  . History of malnutrition 02/28/2020  . History of depression 02/28/2020  . Protein-calorie malnutrition, severe 06/26/2019  . Normocytic anemia 06/24/2019  . Thrombocytosis ([place]) 06/24/2019  . Other dysphagia 06/24/2019  . Dysphagia 06/24/2019  . Palliative care by specialist 06/02/2019  . Iron deficiency anemia 06/02/2019  . 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 Generalized weakness 06/02/2019  . 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 Pneumonia 05/25/2019  . Sepsis ([place])   . Goals of care, counseling/discussion   . DNR (do not resuscitate) discussion   . Atypical pneumonia   . H/O bilateral hip replacements 04/26/2019  . 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 Seropositive rheumatoid arthritis ([place]) 04/21/2019  . Incomplete emptying of bladder 02/03/2017  . 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 Abnormal finding on chest xray 07/18/2013  . Chronic cough 07/18/2013  . Neutropenia ([place]) 09/03/2012  . Tests ordered 09/03/2012  . 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 Benign localized prostatic hyperplasia with lower urinary tract symptoms (LUTS) 07/07/2012  . Elevated prostate specific antigen (PSA) 07/07/2012      Current Outpatient Medications:  Marland Kitchen  Difluprednate 0.05 % EMUL, Apply to eye., Disp: , Rfl:    .  Multiple Vitamin (MULTIVITAMIN WITH MINERALS) TABS tablet, Take 1 tablet by mouth daily., Disp:  , Rfl:  .  naproxen (NAPROSYN) 500 MG tablet, SMARTSIG:1 Tablet(s) By Mouth Twice Daily, Disp: , Rfl:  .  neomycin-polymyxin-dexameth (MAXITROL) 0.1 % OINT, 1 application., Disp: , Rfl:  .  pantoprazole (PROTONIX) 20 MG tablet, Take 20 mg by mouth 2 (two) times daily., Disp: , Rfl:    Allergies  Allergen Reactions  . Methotrexate     Other reaction(s): Other (See Comments) Weakness,      Past Surgical History:  Procedure Laterality Date  . ESOPHAGOGASTRODUODENOSCOPY N/A 06/26/2019   Procedure: ESOPHAGOGASTRODUODENOSCOPY (EGD);  Surgeon: Lin Landsman, MD;  Location: [facility] ENDOSCOPY;  Service: Gastroenterology;  Laterality: N/A;  . HEMORROIDECTOMY    . HERNIA REPAIR    . JOINT REPLACEMENT Left  THR  . PROSTATE BIOPSY  2013  . TOTAL HIP ARTHROPLASTY Left   . TOTAL HIP ARTHROPLASTY Right 04/26/2019   Procedure: TOTAL HIP ARTHROPLASTY ANTERIOR APPROACH;  Surgeon: Hessie Knows, MD;  Location: [facility] ORS;  Service: Orthopedics;  Laterality: Right;     Family History  Problem Relation Age of Onset  . Prostate cancer Father   . Cancer Father   . Prostate cancer Brother   . Cancer Mother      Social History   Tobacco Use  . Smoking status: Former Smoker    Quit date: 06/04/1980    Years since quitting: 39.8  . Smokeless tobacco: Never Used  Substance Use Topics  . Alcohol use: No    With staff assistance, above reviewed with the patient today.  ROS: As per HPI, otherwise no specific complaints on a limited and focused system review   No results found for this or any previous visit (from the past 72 hour(s)).   PHQ2/9: Depression screen [facility] [address] 2/9 04/11/2020 02/28/2020  Decreased Interest 0 0  Down, Depressed, Hopeless 0 0  PHQ - 2 Score 0 0  Altered sleeping 0 0  Tired, decreased energy 0 0  Change in appetite 0 0  Feeling bad or failure about yourself  0 0   Trouble concentrating 0 0  Moving slowly or fidgety/restless 0 0  Suicidal thoughts 0 0  PHQ-9 Score 0 0  Difficult doing work/chores Not difficult at all Not difficult at all   PHQ-2/9 Result is neg  Fall Risk: Fall Risk  04/11/2020 02/28/2020  Falls in the past year? 1 1  Number falls in past yr: 1 1  Injury with Fall? 1 0      Objective:   Vitals:   04/11/20 0940  BP: 114/78  Pulse: 99  Resp: 16  Temp: 98.1 F (36.7 C)  TempSrc: Temporal  SpO2: 100%  Weight: 159 lb 4.8 oz (72.3 kg)  Height: 5\' 8"  (1.727 m)    Body mass index is 24.22 kg/m.  Physical Exam   NAD, masked, pleasant, was able to get up and onto the exam table without assistance. HEENT - [place]/AT, sclera anicteric, positive glasses,  positive arcus, PERRL, EOMI, conj -diffusely inj'ed bilaterally, no purulent discharge, pharynx clear Neck - supple, no adenopathy, no TM, carotids 2+ and = without bruits bilat Car - RRR without m/g/r Pulm- RR and effort normal at rest, CTA without wheeze or rales Abd - soft, NT diffusely, nondistended Back - no CVA tenderness Skin -an ovoid approximately golf ball sized area of erythema with more central fading was noted in the upper left chest above the breast towards the axilla.  No adjacent lesions noted Ext - no LE edema, Shoulders-limited with motion in abduction, struggled to get much higher than 90 degrees with pain limiting further abduction.  There is grip strength was limited bilaterally, with difficulty making a complete grip bilaterally, and slightly weak bilaterally.  He had good arm movements below shoulder level. His elbows had no tenderness at the olecranon bursa region bilaterally, with no marked swelling of the bursa on either side today.  He had good motion of the elbow joints. Neuro/psychiatric - affect was not flat, very appropriate with conversation             Alert              Grossly non-focal - adequate strength on testing extremities, sensation intact  to LT in distal extremities,  no pronator  drift today, adequate finger-to-nose (still uses middle finger with finger-to-nose testing), had good balance on 2 feet with standing, was able to ambulate and he notes it does so deliberately to be cautious.                Results for orders placed or performed in visit on 02/28/20  CBC with Differential/Platelet  Result Value Ref Range   WBC 4.4 3.8 - 10.8 Thousand/uL   RBC 3.68 (L) 4.20 - 5.80 Million/uL   Hemoglobin 9.3 (L) 13.2 - 17.1 g/dL   HCT 29.4 (L) 38.5 - 50.0 %   MCV 79.9 (L) 80.0 - 100.0 fL   MCH 25.3 (L) 27.0 - 33.0 pg   MCHC 31.6 (L) 32.0 - 36.0 g/dL   RDW 16.5 (H) 11.0 - 15.0 %   Platelets 417 (H) 140 - 400 Thousand/uL   MPV 9.6 7.5 - 12.5 fL   Neutro Abs 2,284 1,500 - 7,800 cells/uL   Lymphs Abs 1,615 850 - 3,900 cells/uL   Absolute Monocytes 361 200 - 950 cells/uL   Eosinophils Absolute 119 15 - 500 cells/uL   Basophils Absolute 22 0 - 200 cells/uL   Neutrophils Relative % 51.9 %   Total Lymphocyte 36.7 %   Monocytes Relative 8.2 %   Eosinophils Relative 2.7 %   Basophils Relative 0.5 %  COMPLETE METABOLIC PANEL WITH GFR  Result Value Ref Range   Glucose, Bld 126 (H) 65 - 99 mg/dL   BUN 14 7 - 25 mg/dL   Creat 0.67 (L) 0.70 - 1.18 mg/dL   GFR, Est Non African American 92 > OR = 60 mL/min/1.61m2   GFR, Est African American 107 > OR = 60 mL/min/1.40m2   BUN/Creatinine Ratio 21 6 - 22 (calc)   Sodium 137 135 - 146 mmol/L   Potassium 4.1 3.5 - 5.3 mmol/L   Chloride 100 98 - 110 mmol/L   CO2 30 20 - 32 mmol/L   Calcium 8.7 8.6 - 10.3 mg/dL   Total Protein 8.0 6.1 - 8.1 g/dL   Albumin 2.9 (L) 3.6 - 5.1 g/dL   Globulin 5.1 (H) 1.9 - 3.7 g/dL (calc)   AG Ratio 0.6 (L) 1.0 - 2.5 (calc)   Total Bilirubin 0.2 0.2 - 1.2 mg/dL   Alkaline phosphatase (APISO) 97 35 - 144 U/L   AST 17 10 - 35 U/L   ALT 11 9 - 46 U/L  PSA  Result Value Ref Range   PSA 2.7 < OR = 4.0 ng/mL  Urinalysis, Complete  Result Value Ref Range    Color, Urine DARK YELLOW YELLOW   APPearance CLOUDY (A) CLEAR   Specific Gravity, Urine 1.037 (H) 1.001 - 1.03   pH < OR = 5.0 5.0 - 8.0   Glucose, UA NEGATIVE NEGATIVE   Bilirubin Urine NEGATIVE NEGATIVE   Ketones, ur TRACE (A) NEGATIVE   Hgb urine dipstick 3+ (A) NEGATIVE   Protein, ur 1+ (A) NEGATIVE   Nitrite NEGATIVE NEGATIVE   Leukocytes,Ua TRACE (A) NEGATIVE   WBC, UA 0-5 0 - 5 /HPF   RBC / HPF PACKED (A) 0 - 2 /HPF   Squamous Epithelial / LPF 0-5 < OR = 5 /HPF   TRANSITIONAL EPITHELIAL CELLS 0-5 < OR = 5 /HPF   Bacteria, UA NONE SEEN NONE SEEN /HPF   Calcium Oxalate Crystal FEW NONE OR FE /HPF   Hyaline Cast NONE SEEN NONE SEEN /LPF   Labs from after last visit were reviewed today  Assessment & Plan:   1. Normocytic anemia Continuing to follow, and expect that it has continued to improve. Agreed to recheck labs on the next follow-up visit  2. Seropositive rheumatoid arthritis ([place]) Noted still some concerns related to his hand and shoulder joint regions, and possibly related to his recent eye findings with [facility] involved now.  His wife and him do not want to see the rheumatologist again, and I have not pushed that presently. He has significantly improved in the recent past and will continue to monitor presently  3. PIN III (prostatic intraepithelial neoplasm III) His last PSA was okay, and likely will need to get urology involved over time  4. Thrombocytosis ([place]) Continuing to follow presently.  5. Elevated prostate specific antigen (PSA) As above, last PSA check okay, and continue to monitor. The has not had concerning LUTS symptoms in recent past.  6. History of malnutrition Wt Readings from Last 3 Encounters:  04/11/20 159 lb 4.8 oz (72.3 kg)  02/28/20 150 lb 3.2 oz (68.1 kg)  09/17/19 115 lb (52.2 kg)   Is nutritional status has continued to improve, with him gaining weight again since our last visit. Continuing to monitor.  7. History of depression Denies  any recent concerns of increased depression symptoms, and monitoring. Have not restarted the SSRI he was on previously  8. Episcleritis of both eyes Concern for a more expanded diagnosis or different diagnosis based on the patient's history today.  He is now followed at [facility], and has a follow-up appointment tomorrow. Await their further input and management.  9. Olecranon bursitis of both elbows Educated patient and wife on this diagnosis, no fluid to try to aspirate today, and it is much improved. Can apply ice topically if recurs or worsens symptoms increasing. Also to follow-up if more problematic again.  10. Tinea Do feel the chest lesion is likely consistent with tinea, and an antifungal cream was prescribed-ketoconazole 2% apply topically twice daily for 2 weeks. Did note will take some time for this to slowly resolve. Also recommended trying not to scratch the area, and a paper prescription had to be written with the computers down.  He continues to look much improved, and agreed to recheck labs again on his follow-up visit in a couple months time, and follow-up sooner as needed.       Towanda Malkin, MD 04/11/20 10:02 AM

## 2020-04-11 ENCOUNTER — Other Ambulatory Visit: Payer: Self-pay

## 2020-04-11 ENCOUNTER — Encounter: Payer: Self-pay | Admitting: Internal Medicine

## 2020-04-11 ENCOUNTER — Ambulatory Visit (INDEPENDENT_AMBULATORY_CARE_PROVIDER_SITE_OTHER): Payer: Medicare HMO | Admitting: Internal Medicine

## 2020-04-11 VITALS — BP 114/78 | HR 99 | Temp 98.1°F | Resp 16 | Ht 68.0 in | Wt 159.3 lb

## 2020-04-11 DIAGNOSIS — Z8659 Personal history of other mental and behavioral disorders: Secondary | ICD-10-CM

## 2020-04-11 DIAGNOSIS — D075 Carcinoma in situ of prostate: Secondary | ICD-10-CM | POA: Diagnosis not present

## 2020-04-11 DIAGNOSIS — D649 Anemia, unspecified: Secondary | ICD-10-CM

## 2020-04-11 DIAGNOSIS — H15103 Unspecified episcleritis, bilateral: Secondary | ICD-10-CM | POA: Diagnosis not present

## 2020-04-11 DIAGNOSIS — Z8639 Personal history of other endocrine, nutritional and metabolic disease: Secondary | ICD-10-CM | POA: Diagnosis not present

## 2020-04-11 DIAGNOSIS — D75839 Thrombocytosis, unspecified: Secondary | ICD-10-CM

## 2020-04-11 DIAGNOSIS — R972 Elevated prostate specific antigen [PSA]: Secondary | ICD-10-CM | POA: Diagnosis not present

## 2020-04-11 DIAGNOSIS — D473 Essential (hemorrhagic) thrombocythemia: Secondary | ICD-10-CM

## 2020-04-11 DIAGNOSIS — M7022 Olecranon bursitis, left elbow: Secondary | ICD-10-CM

## 2020-04-11 DIAGNOSIS — M059 Rheumatoid arthritis with rheumatoid factor, unspecified: Secondary | ICD-10-CM

## 2020-04-11 DIAGNOSIS — B359 Dermatophytosis, unspecified: Secondary | ICD-10-CM

## 2020-04-11 DIAGNOSIS — M7021 Olecranon bursitis, right elbow: Secondary | ICD-10-CM

## 2020-04-12 ENCOUNTER — Ambulatory Visit: Payer: Medicare HMO

## 2020-04-12 DIAGNOSIS — H3322 Serous retinal detachment, left eye: Secondary | ICD-10-CM | POA: Diagnosis not present

## 2020-04-12 DIAGNOSIS — H25813 Combined forms of age-related cataract, bilateral: Secondary | ICD-10-CM | POA: Diagnosis not present

## 2020-04-12 DIAGNOSIS — H15013 Anterior scleritis, bilateral: Secondary | ICD-10-CM | POA: Diagnosis not present

## 2020-04-12 DIAGNOSIS — H15033 Posterior scleritis, bilateral: Secondary | ICD-10-CM | POA: Diagnosis not present

## 2020-04-12 DIAGNOSIS — H43813 Vitreous degeneration, bilateral: Secondary | ICD-10-CM | POA: Diagnosis not present

## 2020-04-12 DIAGNOSIS — H35363 Drusen (degenerative) of macula, bilateral: Secondary | ICD-10-CM | POA: Diagnosis not present

## 2020-04-13 DIAGNOSIS — M19012 Primary osteoarthritis, left shoulder: Secondary | ICD-10-CM | POA: Diagnosis not present

## 2020-04-13 DIAGNOSIS — M6281 Muscle weakness (generalized): Secondary | ICD-10-CM | POA: Diagnosis not present

## 2020-04-25 ENCOUNTER — Telehealth: Payer: Self-pay | Admitting: Internal Medicine

## 2020-04-25 NOTE — Telephone Encounter (Signed)
Left message for patient to call back and schedule Medicare Annual Wellness Visit (AWV) either virtually or in office.   Due an AWV as of 11/10/2009 ; please schedule at anytime with [facility] Health Advisor.

## 2020-05-03 ENCOUNTER — Ambulatory Visit: Payer: Medicare HMO

## 2020-05-17 DIAGNOSIS — H3322 Serous retinal detachment, left eye: Secondary | ICD-10-CM | POA: Diagnosis not present

## 2020-05-17 DIAGNOSIS — H15033 Posterior scleritis, bilateral: Secondary | ICD-10-CM | POA: Diagnosis not present

## 2020-05-17 DIAGNOSIS — Z7952 Long term (current) use of systemic steroids: Secondary | ICD-10-CM | POA: Diagnosis not present

## 2020-05-17 DIAGNOSIS — H35363 Drusen (degenerative) of macula, bilateral: Secondary | ICD-10-CM | POA: Diagnosis not present

## 2020-05-17 DIAGNOSIS — M059 Rheumatoid arthritis with rheumatoid factor, unspecified: Secondary | ICD-10-CM | POA: Diagnosis not present

## 2020-05-17 DIAGNOSIS — D892 Hypergammaglobulinemia, unspecified: Secondary | ICD-10-CM | POA: Diagnosis not present

## 2020-05-17 DIAGNOSIS — R627 Adult failure to thrive: Secondary | ICD-10-CM | POA: Diagnosis not present

## 2020-05-17 DIAGNOSIS — Z1159 Encounter for screening for other viral diseases: Secondary | ICD-10-CM | POA: Diagnosis not present

## 2020-05-17 DIAGNOSIS — H25813 Combined forms of age-related cataract, bilateral: Secondary | ICD-10-CM | POA: Diagnosis not present

## 2020-05-17 DIAGNOSIS — H15013 Anterior scleritis, bilateral: Secondary | ICD-10-CM | POA: Diagnosis not present

## 2020-05-17 DIAGNOSIS — H43813 Vitreous degeneration, bilateral: Secondary | ICD-10-CM | POA: Diagnosis not present

## 2020-06-05 ENCOUNTER — Ambulatory Visit (INDEPENDENT_AMBULATORY_CARE_PROVIDER_SITE_OTHER): Payer: Medicare HMO

## 2020-06-05 ENCOUNTER — Other Ambulatory Visit: Payer: Self-pay

## 2020-06-05 DIAGNOSIS — Z0000 Encounter for general adult medical examination without abnormal findings: Secondary | ICD-10-CM

## 2020-06-05 NOTE — Progress Notes (Signed)
Subjective:   [name] Foster is a 79 y.o. male who presents for Medicare Annual/Subsequent preventive examination.  Virtual Visit via Telephone Note  I connected with  [name] Foster on 06/05/20 at 10:00 AM EDT by telephone and verified that I am speaking with the correct person using two identifiers.  Medicare Annual Wellness visit completed telephonically due to Covid-19 pandemic.   Location: Patient: home Provider: office    I discussed the limitations, risks, security and privacy concerns of performing an evaluation and management service by telephone and the availability of in person appointments. The patient expressed understanding and agreed to proceed.  Unable to perform video visit due to video visit attempted and failed and/or patient does not have video capability.   Some vital signs may be absent or patient reported.   Clemetine Marker, LPN    Review of Systems     Cardiac Risk Factors include: advanced age (>60men, >14 women);male gender;sedentary lifestyle     Objective:    There were no vitals filed for this visit. There is no height or weight on file to calculate BMI.  Advanced Directives 06/05/2020 09/17/2019 07/19/2019 06/24/2019 06/24/2019 06/24/2019 06/16/2019  Does Patient Have a Medical Advance Directive? Yes No - No No No No  Type of Paramedic of [place];Living will - - - - - -  Copy of [place] in Chart? Yes - validated most recent copy scanned in chart (See row information) - - - - - -  Would patient like information on creating a medical advance directive? - - No - Patient declined No - Patient declined - No - Patient declined No - Patient declined    Current Medications (verified) Outpatient Encounter Medications as of 06/05/2020  Medication Sig  . Difluprednate 0.05 % EMUL Apply to eye.  . Multiple Vitamin (MULTIVITAMIN WITH MINERALS) TABS tablet Take 1 tablet by mouth daily.  . predniSONE (DELTASONE)  10 MG tablet Take 1 tablet by mouth daily.  . [DISCONTINUED] naproxen (NAPROSYN) 500 MG tablet SMARTSIG:1 Tablet(s) By Mouth Twice Daily  . [DISCONTINUED] neomycin-polymyxin-dexameth (MAXITROL) 0.1 % OINT 1 application.  . [DISCONTINUED] pantoprazole (PROTONIX) 20 MG tablet Take 20 mg by mouth 2 (two) times daily.   No facility-administered encounter medications on file as of 06/05/2020.    Allergies (verified) Methotrexate   History: Past Medical History:  Diagnosis Date  . Arthritis   . BPH (benign prostatic hyperplasia)   . Former smoker, stopped smoking many years ago   . History of needle biopsy of prostate with negative result 2013  . Hypertension   . Prostate enlargement   . Prostatic intraepithelial neoplasm III 2013   Past Surgical History:  Procedure Laterality Date  . ESOPHAGOGASTRODUODENOSCOPY N/A 06/26/2019   Procedure: ESOPHAGOGASTRODUODENOSCOPY (EGD);  Surgeon: Lin Landsman, MD;  Location: [facility];  Service: Gastroenterology;  Laterality: N/A;  . HEMORROIDECTOMY    . HERNIA REPAIR    . JOINT REPLACEMENT Left    THR  . PROSTATE BIOPSY  2013  . TOTAL HIP ARTHROPLASTY Left   . TOTAL HIP ARTHROPLASTY Right 04/26/2019   Procedure: TOTAL HIP ARTHROPLASTY ANTERIOR APPROACH;  Surgeon: Hessie Knows, MD;  Location: [facility] ORS;  Service: Orthopedics;  Laterality: Right;   Family History  Problem Relation Age of Onset  . Prostate cancer Father   . Cancer Father   . Prostate cancer Brother   . Cancer Mother    Social History   Socioeconomic History  . Marital  status: Married    Spouse name: Not on file  . Number of children: 4  . Years of education: Not on file  . Highest education level: Not on file  Occupational History  . Not on file  Tobacco Use  . Smoking status: Former Smoker    Quit date: 06/04/1980    Years since quitting: 40.0  . Smokeless tobacco: Never Used  Vaping Use  . Vaping Use: Never used  Substance and Sexual Activity  . Alcohol  use: No  . Drug use: No  . Sexual activity: Yes  Other Topics Concern  . Not on file  Social History Narrative  . Not on file   Social Determinants of Health   Financial Resource Strain: Low Risk   . Difficulty of Paying Living Expenses: Not hard at all  Food Insecurity: No Food Insecurity  . Worried About Charity fundraiser in the Last Year: Never true  . Ran Out of Food in the Last Year: Never true  Transportation Needs: No Transportation Needs  . Lack of Transportation (Medical): No  . Lack of Transportation (Non-Medical): No  Physical Activity: Insufficiently Active  . Days of Exercise per Week: 3 days  . Minutes of Exercise per Session: 10 min  Stress: No Stress Concern Present  . Feeling of Stress : Not at all  Social Connections: Unknown  . Frequency of Communication with Friends and Family: Not on file  . Frequency of Social Gatherings with Friends and Family: Not on file  . Attends Religious Services: Not on file  . Active Member of Clubs or Organizations: Not on file  . Attends Archivist Meetings: Not on file  . Marital Status: Married    Tobacco Counseling Counseling given: Not Answered   Clinical Intake:  Pre-visit preparation completed: Yes  Pain : No/denies pain     Nutritional Risks: None Diabetes: No  How often do you need to have someone help you when you read instructions, pamphlets, or other written materials from your doctor or pharmacy?: 1 - Never   Interpreter Needed?: No  Information entered by :: Clemetine Marker LPN   Activities of Daily Living In your present state of health, do you have any difficulty performing the following activities: 06/05/2020 04/11/2020  Hearing? Tempie Donning  Vision? Y Y  Difficulty concentrating or making decisions? N N  Walking or climbing stairs? Y Y  Dressing or bathing? N N  Doing errands, shopping? Tempie Donning  Preparing Food and eating ? N -  Using the Toilet? N -  In the past six months, have you accidently  leaked urine? N -  Do you have problems with loss of bowel control? N -  Managing your Medications? N -  Managing your Finances? N -  Housekeeping or managing your Housekeeping? N -  Some recent data might be hidden    Patient Care Team: Towanda Malkin, MD as PCP - General (Internal Medicine) Jason Coop, NP as Nurse Practitioner Earlie Server, MD as Consulting Physician (Oncology)  Indicate any recent Medical Services you may have received from other than [facility] providers in the past year (date may be approximate).     Assessment:   This is a routine wellness examination for Ebony.  Hearing/Vision screen  Hearing Screening   125Hz  250Hz  500Hz  1000Hz  2000Hz  3000Hz  4000Hz  6000Hz  8000Hz   Right ear:           Left ear:  Comments: Pt c/o mild hearing difficulty. Information provided on local hearing clinics.   Vision Screening Comments: Annual vision screenings at [place] issues and exercise activities discussed: Current Exercise Habits: Home exercise routine, Type of exercise: strength training/weights;Other - see comments (exercise bike), Time (Minutes): 10, Frequency (Times/Week): 3, Weekly Exercise (Minutes/Week): 30, Intensity: Mild, Exercise limited by: orthopedic condition(s)  Goals   None    Depression Screen PHQ 2/9 Scores 06/05/2020 04/11/2020 02/28/2020  PHQ - 2 Score 0 0 0  PHQ- 9 Score - 0 0    Fall Risk Fall Risk  06/05/2020 04/11/2020 02/28/2020  Falls in the past year? 1 1 1   Number falls in past yr: 1 1 1   Injury with Fall? 1 1 0  Risk for fall due to : History of fall(s) - -  Follow up Falls prevention discussed - -    Any stairs in or around the home? Yes  If so, are there any without handrails? Yes  Home free of loose throw rugs in walkways, pet beds, electrical cords, etc? Yes  Adequate lighting in your home to reduce risk of falls? Yes   ASSISTIVE DEVICES UTILIZED TO PREVENT FALLS:  Life alert? No  Use of a  cane, walker or w/c? No  Grab bars in the bathroom? Yes  Shower chair or bench in shower? Yes  Elevated toilet seat or a handicapped toilet? Yes   TIMED UP AND GO:  Was the test performed? No . Telephonic visit  Cognitive Function:     6CIT Screen 06/05/2020  What Year? 0 points  What month? 0 points  What time? 0 points  Count back from 20 0 points  Months in reverse 0 points  Repeat phrase 2 points  Total Score 2    Immunizations Immunization History  Administered Date(s) Administered  . Influenza, Seasonal, Injecte, Preservative Fre 09/02/2013  . Pneumococcal Conjugate-13 03/03/2014    TDAP status: Due, Education has been provided regarding the importance of this vaccine. Advised may receive this vaccine at local pharmacy or Health Dept. Aware to provide a copy of the vaccination record if obtained from local pharmacy or Health Dept. Verbalized acceptance and understanding.   Flu Vaccine status: Declined, Education has been provided regarding the importance of this vaccine but patient still declined. Advised may receive this vaccine at local pharmacy or Health Dept. Aware to provide a copy of the vaccination record if obtained from local pharmacy or Health Dept. Verbalized acceptance and understanding.   Pneumococcal vaccine status: Declined,  Education has been provided regarding the importance of this vaccine but patient still declined. Advised may receive this vaccine at local pharmacy or Health Dept. Aware to provide a copy of the vaccination record if obtained from local pharmacy or Health Dept. Verbalized acceptance and understanding.    Covid - 19 vaccine: pt states he was advised not to receive vaccine due to immunosuppressive therapy.   Qualifies for Shingles Vaccine? No  - immunocompromised  Zostavax completed No   Shingrix completed: No  Screening Tests Health Maintenance  Topic Date Due  . COVID-19 Vaccine (1) Never done  . TETANUS/TDAP  Never done  . PNA  vac Low Risk Adult (2 of 2 - PPSV23) 03/04/2015  . INFLUENZA VACCINE  06/10/2020  . Hepatitis C Screening  Completed    Health Maintenance  Health Maintenance Due  Topic Date Due  . COVID-19 Vaccine (1) Never done  . TETANUS/TDAP  Never done  . PNA vac  Low Risk Adult (2 of 2 - PPSV23) 03/04/2015    Colorectal cancer screening: No longer required.   Lung Cancer Screening: (Low Dose CT Chest recommended if Age 8-80 years, 30 pack-year currently smoking OR have quit w/in 15years.) does not qualify.   Additional Screening:  Hepatitis C Screening: does qualify; Completed 06/13/19  Vision Screening: Recommended annual ophthalmology exams for early detection of glaucoma and other disorders of the eye. Is the patient up to date with their annual eye exam?  Yes  Who is the provider or what is the name of the office in which the patient attends annual eye exams? [facility]  Dental Screening: Recommended annual dental exams for proper oral hygiene  Community Resource Referral / Chronic Care Management: CRR required this visit?  No   CCM required this visit?  No      Plan:     I have personally reviewed and noted the following in the patient's chart:   . Medical and social history . Use of alcohol, tobacco or illicit drugs  . Current medications and supplements . Functional ability and status . Nutritional status . Physical activity . Advanced directives . List of other physicians . Hospitalizations, surgeries, and ER visits in previous 12 months . Vitals . Screenings to include cognitive, depression, and falls . Referrals and appointments  In addition, I have reviewed and discussed with patient certain preventive protocols, quality metrics, and best practice recommendations. A written personalized care plan for preventive services as well as general preventive health recommendations were provided to patient.     Clemetine Marker, LPN   9/37/1696   Nurse Notes: pt  prescribed humira by rheumatology at [facility]. Pt states he has not received it in the mail as of yet. Phone number provided to [place] for patient to follow up as it shows dispensed on 05/24/20. Pt also advised to contact rheumatology if needed regarding this prescription.

## 2020-06-05 NOTE — Patient Instructions (Addendum)
[name] Foster , Thank you for taking time to come for your Medicare Wellness Visit. I appreciate your ongoing commitment to your health goals. Please review the following plan we discussed and let me know if I can assist you in the future.   Screening recommendations/referrals: Colonoscopy: no longer required Recommended yearly ophthalmology/optometry visit for glaucoma screening and checkup Recommended yearly dental visit for hygiene and checkup  Vaccinations: Influenza vaccine: postponed Pneumococcal vaccine: postponed Tdap vaccine: due Shingles vaccine: not eligible    Covid-19: not eligible  Conditions/risks identified:   Free hearing clinics offered in [place]:   [name] Foster, [address], [place] [address] [phone]  [facility] [place], [place] [address], [place] [address] [phone]   Next appointment: Follow up in one year for your annual wellness visit.   Preventive Care 44 Years and Older, Male Preventive care refers to lifestyle choices and visits with your health care provider that can promote health and wellness. What does preventive care include?  A yearly physical exam. This is also called an annual well check.  Dental exams once or twice a year.  Routine eye exams. Ask your health care provider how often you should have your eyes checked.  Personal lifestyle choices, including:  Daily care of your teeth and gums.  Regular physical activity.  Eating a healthy diet.  Avoiding tobacco and drug use.  Limiting alcohol use.  Practicing safe sex.  Taking low doses of aspirin every day.  Taking vitamin and mineral supplements as recommended by your health care provider. What happens during an annual well check? The services and screenings done by your health care provider during your annual well check will depend on your age, overall health, lifestyle risk factors, and family history of  disease. Counseling  Your health care provider may ask you questions about your:  Alcohol use.  Tobacco use.  Drug use.  Emotional well-being.  Home and relationship well-being.  Sexual activity.  Eating habits.  History of falls.  Memory and ability to understand (cognition).  Work and work Statistician. Screening  You may have the following tests or measurements:  Height, weight, and BMI.  Blood pressure.  Lipid and cholesterol levels. These may be checked every 5 years, or more frequently if you are over 42 years old.  Skin check.  Lung cancer screening. You may have this screening every year starting at age 89 if you have a 30-pack-year history of smoking and currently smoke or have quit within the past 15 years.  Fecal occult blood test (FOBT) of the stool. You may have this test every year starting at age 48.  Flexible sigmoidoscopy or colonoscopy. You may have a sigmoidoscopy every 5 years or a colonoscopy every 10 years starting at age 25.  Prostate cancer screening. Recommendations will vary depending on your family history and other risks.  Hepatitis C blood test.  Hepatitis B blood test.  Sexually transmitted disease (STD) testing.  Diabetes screening. This is done by checking your blood sugar (glucose) after you have not eaten for a while (fasting). You may have this done every 1-3 years.  Abdominal aortic aneurysm (AAA) screening. You may need this if you are a current or former smoker.  Osteoporosis. You may be screened starting at age 79 if you are at high risk. Talk with your health care provider about your test results, treatment options, and if necessary, the need for more tests. Vaccines  Your health care  provider may recommend certain vaccines, such as:  Influenza vaccine. This is recommended every year.  Tetanus, diphtheria, and acellular pertussis (Tdap, Td) vaccine. You may need a Td booster every 10 years.  Zoster vaccine. You may  need this after age 60.  Pneumococcal 13-valent conjugate (PCV13) vaccine. One dose is recommended after age 1.  Pneumococcal polysaccharide (PPSV23) vaccine. One dose is recommended after age 37. Talk to your health care provider about which screenings and vaccines you need and how often you need them. This information is not intended to replace advice given to you by your health care provider. Make sure you discuss any questions you have with your health care provider. Document Released: 11/23/2015 Document Revised: 07/16/2016 Document Reviewed: 08/28/2015 Elsevier Interactive Patient Education  2017 [place] Prevention in the Home Falls can cause injuries. They can happen to people of all ages. There are many things you can do to make your home safe and to help prevent falls. What can I do on the outside of my home?  Regularly fix the edges of walkways and driveways and fix any cracks.  Remove anything that might make you trip as you walk through a door, such as a raised step or threshold.  Trim any bushes or trees on the path to your home.  Use bright outdoor lighting.  Clear any walking paths of anything that might make someone trip, such as rocks or tools.  Regularly check to see if handrails are loose or broken. Make sure that both sides of any steps have handrails.  Any raised decks and porches should have guardrails on the edges.  Have any leaves, snow, or ice cleared regularly.  Use sand or salt on walking paths during winter.  Clean up any spills in your garage right away. This includes oil or grease spills. What can I do in the bathroom?  Use night lights.  Install grab bars by the toilet and in the tub and shower. Do not use towel bars as grab bars.  Use non-skid mats or decals in the tub or shower.  If you need to sit down in the shower, use a plastic, non-slip stool.  Keep the floor dry. Clean up any water that spills on the floor as soon as it  happens.  Remove soap buildup in the tub or shower regularly.  Attach bath mats securely with double-sided non-slip rug tape.  Do not have throw rugs and other things on the floor that can make you trip. What can I do in the bedroom?  Use night lights.  Make sure that you have a light by your bed that is easy to reach.  Do not use any sheets or blankets that are too big for your bed. They should not hang down onto the floor.  Have a firm chair that has side arms. You can use this for support while you get dressed.  Do not have throw rugs and other things on the floor that can make you trip. What can I do in the kitchen?  Clean up any spills right away.  Avoid walking on wet floors.  Keep items that you use a lot in easy-to-reach places.  If you need to reach something above you, use a strong step stool that has a grab bar.  Keep electrical cords out of the way.  Do not use floor polish or wax that makes floors slippery. If you must use wax, use non-skid floor wax.  Do not have throw  rugs and other things on the floor that can make you trip. What can I do with my stairs?  Do not leave any items on the stairs.  Make sure that there are handrails on both sides of the stairs and use them. Fix handrails that are broken or loose. Make sure that handrails are as long as the stairways.  Check any carpeting to make sure that it is firmly attached to the stairs. Fix any carpet that is loose or worn.  Avoid having throw rugs at the top or bottom of the stairs. If you do have throw rugs, attach them to the floor with carpet tape.  Make sure that you have a light switch at the top of the stairs and the bottom of the stairs. If you do not have them, ask someone to add them for you. What else can I do to help prevent falls?  Wear shoes that:  Do not have high heels.  Have rubber bottoms.  Are comfortable and fit you well.  Are closed at the toe. Do not wear sandals.  If you  use a stepladder:  Make sure that it is fully opened. Do not climb a closed stepladder.  Make sure that both sides of the stepladder are locked into place.  Ask someone to hold it for you, if possible.  Clearly mark and make sure that you can see:  Any grab bars or handrails.  First and last steps.  Where the edge of each step is.  Use tools that help you move around (mobility aids) if they are needed. These include:  Canes.  Walkers.  Scooters.  Crutches.  Turn on the lights when you go into a dark area. Replace any light bulbs as soon as they burn out.  Set up your furniture so you have a clear path. Avoid moving your furniture around.  If any of your floors are uneven, fix them.  If there are any pets around you, be aware of where they are.  Review your medicines with your doctor. Some medicines can make you feel dizzy. This can increase your chance of falling. Ask your doctor what other things that you can do to help prevent falls. This information is not intended to replace advice given to you by your health care provider. Make sure you discuss any questions you have with your health care provider. Document Released: 08/23/2009 Document Revised: 04/03/2016 Document Reviewed: 12/01/2014 Elsevier Interactive Patient Education  2017 [employer].

## 2020-06-12 NOTE — Progress Notes (Signed)
Patient ID: [name] Foster, male    DOB: 13-Dec-1940, 79 y.o.   MRN: [id number]  PCP: Towanda Malkin, MD  Chief Complaint  Patient presents with  . Follow-up    Subjective:   [name] Foster is a 79 y.o. male, presents to clinic with CC of the following:  Chief Complaint  Patient presents with  . Follow-up    HPI:  Patient is a 79 year old male Last visit with me was 04/11/2020  He established with the practice 02/28/2020 after a prolonged hospitalization, with that note again reviewed, and wife discharge palliative care services 03/20/2020 after he had significantly improved. follows up today. Wife present with him today on follow-up.  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 Patient with known history of RF, previously was tried on MTX but had adverse reaction with failure to thrive. Significantly elevated inflammatory markes (ESR 83, CRP 7.64, RF 248, ANA 1:160 speckled pattern). Also c/o chronic joint swelling  On follow-up 05/17/2020, it was noted to be markedly improved, with plans to taper the prednisone dose at that point, and follow-up again in 6 to 8 weeks.  His subsequent follow-up phone visit with rheumatology on 05/22/2020 noted the following:   regarding potentially starting adalimumab in the setting. Discussed the risks and benefits of the medication as below. Patient is in agreement with moving forward with this medication option. We will need to get free medication for him or consider other TNF alternative. Patient is unable and reticent to restart methotrexate given his significant failure to thrive incident in the fall 2020 which she relates to taking methotrexate for 2 weeks. 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 We will move forward with Humira uveitis protocol. Patient is in agreement with the same.  The TNF inhibitor Humira will be started. The potential side effects include, but are not limited to, injection site reactions, increased risk of infections (particularly tuberculosis and fungus), decreased blood counts, lupus-like reaction, neurologic symptoms resembling multiple sclerosis, and mild increased risk of certain cancers. Side effects were discussed with the patient in detail and the patient demonstrated understanding. Laboratory monitoring of blood counts will be checked at baseline and then yearly after that. A tuberculin skin test to screen for prior exposure to tuberculosis will also be performed.  On 06/05/2020 wife called with the following message - Mrs Droke called to say that there isn't any way to afford the 2400.00 co-pay requested by the patients insurance and to check on the status of the possibility of getting the medication adalimumab in a more financially reasonable way.  It was noted they are trying to get him free Humira. She noted today she has not yet received the application, and has not called and is awaiting that to come to complete.  It is hopeful they will be able to obtain a Humira and start soon.   He has continued to improve, and today when asked, he states he is doing well.  His appetite continues to be good, his weight has continued to increase.  Discussed today trying to revert to more healthy weight maintenance at this point, and not continuing to increase his weight over time.  He is doing well getting  around,  He still notes occasional joint pains in the hands,  but his grip strength continues to improve, now able to open bottles and do more with his hands.   He notes the only medicines he takes presently is a multivitamin, an eyedrop, and the prednisone daily  Wt Readings from  Last 3 Encounters:  06/13/20 177 lb 14.4 oz (80.7 kg)  04/11/20 159 lb 4.8 oz (72.3 kg)  02/28/20 150 lb 3.2 oz (68.1 kg)    Normocytic anemia Patient was anemic in the past. Most recent CBC -showed was stable to slightly improving      Lab Results  Component Value Date   WBC 4.4 02/28/2020   HGB 9.3 (L) 02/28/2020   HCT 29.4 (L) 02/28/2020   MCV 79.9 (L) 02/28/2020   PLT 417 (H) 02/28/2020  Denies CP, SOB, energy levels are good  Seropositive rheumatoid arthritis ([place]) He has a history of this, although he really seemed to decline after this diagnosis and methotrexate was started briefly.   Both him and his wife noted last visit that they were adamant about not returning to rheumatologist in the past. They did get a follow-up call from rheumatology, after seeing the ophthalmologist, and are involved with the recommendations to starting Humira, with plans to do so if they can get the medication without the huge expense.  PIN III (prostatic intraepithelial neoplasm III)/Elevated PSA prior urology notesstating carcinoma in situ of the prostate.He also has a family history of a brother and father with prostate cancer noted. he denies any concerning symptoms here recently. He had been on medication to manage LUTS in the past. PSA checked with last visit was 2.7      Lab Results  Component Value Date   PSA1 1.9 08/06/2018   PSA 2.7 02/28/2020    Thrombocytosis ([facility])      Lab Results  Component Value Date   WBC 4.4 02/28/2020   HGB 9.3 (L) 02/28/2020   HCT 29.4 (L) 02/28/2020   MCV 79.9 (L) 02/28/2020   PLT 417 (H) 02/28/2020  Following presently   History of depression Denied any depressive symptoms today, and is doing quite well.  Had been on a Zoloft product at one point, with no need to resume that presently PHQ-9 today reviewed  Priorright hip replacementJune 2020, left hip replacementmany yearsago,  Patient still has not received the  Covid vaccine, and strongly encouraged that today.  Patient Active Problem List   Diagnosis Date Noted  . Olecranon bursitis of both elbows 04/11/2020  . Tinea 04/11/2020  . Episcleritis of both eyes 02/28/2020  . History of malnutrition 02/28/2020  . History of depression 02/28/2020  . Protein-calorie malnutrition, severe 06/26/2019  . Normocytic anemia 06/24/2019  . Thrombocytosis ([place]) 06/24/2019  . Dysphagia 06/24/2019  . Palliative care by specialist 06/02/2019  . Iron deficiency anemia 06/02/2019  . 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 Generalized weakness 06/02/2019  . Panlobular emphysema ([place]) 06/02/2019  . Pneumonia 05/25/2019  . Sepsis ([place])   . Goals of care, counseling/discussion   . DNR (do not resuscitate) discussion   . Atypical pneumonia   . H/O bilateral hip replacements 04/26/2019  . 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 Seropositive rheumatoid arthritis ([place]) 04/21/2019  . Incomplete emptying of bladder 02/03/2017  . 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 Abnormal finding on chest xray 07/18/2013  . Chronic cough 07/18/2013  . Tests ordered 09/03/2012  . 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  Benign localized prostatic hyperplasia with lower urinary tract symptoms (LUTS) 07/07/2012  . Elevated prostate specific antigen (PSA) 07/07/2012      Current Outpatient Medications:  Marland Kitchen  Difluprednate 0.05 % EMUL, Apply to eye., Disp: , Rfl:  .  Multiple Vitamin (MULTIVITAMIN WITH MINERALS) TABS tablet, Take 1 tablet by mouth daily., Disp:  , Rfl:  .  predniSONE (DELTASONE) 10 MG tablet, Take 1 tablet by mouth daily., Disp: , Rfl:    Allergies  Allergen Reactions  . Methotrexate     Other reaction(s): Other (See Comments) Weakness,      Past Surgical History:  Procedure Laterality Date  . ESOPHAGOGASTRODUODENOSCOPY N/A 06/26/2019   Procedure: ESOPHAGOGASTRODUODENOSCOPY (EGD);  Surgeon: Lin Landsman, MD;  Location: [facility] ENDOSCOPY;  Service: Gastroenterology;  Laterality: N/A;  . HEMORROIDECTOMY    . HERNIA REPAIR    . JOINT REPLACEMENT Left    THR  . PROSTATE BIOPSY  2013  . TOTAL HIP ARTHROPLASTY Left   . TOTAL HIP ARTHROPLASTY Right 04/26/2019   Procedure: TOTAL HIP ARTHROPLASTY ANTERIOR APPROACH;  Surgeon: Hessie Knows, MD;  Location: [facility] ORS;  Service: Orthopedics;  Laterality: Right;     Family History  Problem Relation Age of Onset  . Prostate cancer Father   . Cancer Father   . Prostate cancer Brother   . Cancer Mother      Social History   Tobacco Use  . Smoking status: Former Smoker    Quit date: 06/04/1980    Years since quitting: 40.0  . Smokeless tobacco: Never Used  Substance Use Topics  . Alcohol use: No    With staff assistance, above reviewed with the patient today.  ROS: As per HPI, otherwise no specific complaints on a limited and focused system review   No results found for this or any previous visit (from the past 72 hour(s)).   PHQ2/9: Depression screen [facility] 2/9 06/13/2020 06/13/2020 06/05/2020 04/11/2020 02/28/2020  Decreased Interest 0 0 0 0 0  Down, Depressed, Hopeless 0 0 0 0 0  PHQ - 2 Score 0 0 0 0 0  Altered sleeping 0 0 - 0 0  Tired, decreased energy 0 0 - 0 0  Change in appetite 0 0 - 0 0  Feeling bad or failure about yourself  0 0 - 0 0  Trouble concentrating 0 0 - 0 0  Moving slowly or fidgety/restless 0 0 - 0 0  Suicidal thoughts 0 0 - 0 0  PHQ-9 Score 0 0 - 0 0  Difficult doing work/chores Not difficult at all Not difficult at all - Not difficult at all Not difficult at all   PHQ-2/9 Result is neg Fall Risk: Fall Risk  06/13/2020 06/05/2020 04/11/2020 02/28/2020  Falls in the past year? 1 1 1 1   Number falls in past yr: 1 1 1 1   Injury with Fall? 1 1 1  0  Risk for fall due to : - History of fall(s) - -  Follow up - Falls prevention discussed - -      Objective:   Vitals:   06/13/20 1021  BP: 120/82  Pulse: 97  Resp:  16  Temp: 97.8 F (36.6 C)  TempSrc: Temporal  SpO2: 97%  Weight: 177 lb 14.4 oz (80.7 kg)  Height: 5' 8"  (1.727 m)    Body mass index is 27.05 kg/m.  Physical Exam   NAD, masked, looks well, very pleasant HEENT - [place]/AT, sclera anicteric, positive arcus, positive glasses, PERRL, EOMI, conj -  non-inj'ed bilaterally except some minimal redness noted on the very inner aspect of the eyes bilaterally medial to the iris, pharynx clear Neck - supple, no adenopathy, no TM, carotids 2+ and = without bruits bilat Car - RRR without m/g/r Pulm- RR and effort normal at rest, CTA without wheeze or rales Abd - soft, NT diffusely, ND,  Back - no CVA tenderness Ext - no LE edema, no active joints presently in the hands, with very good and much improved grip strength today Neuro/psychiatric - affect was not flat, appropriate with conversation  Alert   Grossly non-focal - good strength on testing extremities, sensation intact to LT in distal extremities, Romberg was negative, no pronator drift, adequate finger-to-nose, ambulates well in the office  Speech normal   Results for orders placed or performed in visit on 02/28/20  CBC with Differential/Platelet  Result Value Ref Range   WBC 4.4 3.8 - 10.8 Thousand/uL   RBC 3.68 (L) 4.20 - 5.80 Million/uL   Hemoglobin 9.3 (L) 13.2 - 17.1 g/dL   HCT 29.4 (L) 38 - 50 %   MCV 79.9 (L) 80.0 - 100.0 fL   MCH 25.3 (L) 27.0 - 33.0 pg   MCHC 31.6 (L) 32.0 - 36.0 g/dL   RDW 16.5 (H) 11.0 - 15.0 %   Platelets 417 (H) 140 - 400 Thousand/uL   MPV 9.6 7.5 - 12.5 fL   Neutro Abs 2,284 1,500 - 7,800 cells/uL   Lymphs Abs 1,615 850 - 3,900 cells/uL   Absolute Monocytes 361 200 - 950 cells/uL   Eosinophils Absolute 119 15 - 500 cells/uL   Basophils Absolute 22 0 - 200 cells/uL   Neutrophils Relative % 51.9 %   Total Lymphocyte 36.7 %   Monocytes Relative 8.2 %   Eosinophils Relative 2.7 %   Basophils Relative 0.5 %  COMPLETE METABOLIC PANEL WITH GFR  Result  Value Ref Range   Glucose, Bld 126 (H) 65 - 99 mg/dL   BUN 14 7 - 25 mg/dL   Creat 0.67 (L) 0.70 - 1.18 mg/dL   GFR, Est Non African American 92 > OR = 60 mL/min/1.16m   GFR, Est African American 107 > OR = 60 mL/min/1.721m  BUN/Creatinine Ratio 21 6 - 22 (calc)   Sodium 137 135 - 146 mmol/L   Potassium 4.1 3.5 - 5.3 mmol/L   Chloride 100 98 - 110 mmol/L   CO2 30 20 - 32 mmol/L   Calcium 8.7 8.6 - 10.3 mg/dL   Total Protein 8.0 6.1 - 8.1 g/dL   Albumin 2.9 (L) 3.6 - 5.1 g/dL   Globulin 5.1 (H) 1.9 - 3.7 g/dL (calc)   AG Ratio 0.6 (L) 1.0 - 2.5 (calc)   Total Bilirubin 0.2 0.2 - 1.2 mg/dL   Alkaline phosphatase (APISO) 97 35 - 144 U/L   AST 17 10 - 35 U/L   ALT 11 9 - 46 U/L  PSA  Result Value Ref Range   PSA 2.7 < OR = 4.0 ng/mL  Urinalysis, Complete  Result Value Ref Range   Color, Urine DARK YELLOW YELLOW   APPearance CLOUDY (A) CLEAR   Specific Gravity, Urine 1.037 (H) 1.001 - 1.03   pH < OR = 5.0 5.0 - 8.0   Glucose, UA NEGATIVE NEGATIVE   Bilirubin Urine NEGATIVE NEGATIVE   Ketones, ur TRACE (A) NEGATIVE   Hgb urine dipstick 3+ (A) NEGATIVE   Protein, ur 1+ (A) NEGATIVE   Nitrite NEGATIVE NEGATIVE   Leukocytes,Ua  TRACE (A) NEGATIVE   WBC, UA 0-5 0 - 5 /HPF   RBC / HPF PACKED (A) 0 - 2 /HPF   Squamous Epithelial / LPF 0-5 < OR = 5 /HPF   TRANSITIONAL EPITHELIAL CELLS 0-5 < OR = 5 /HPF   Bacteria, UA NONE SEEN NONE SEEN /HPF   Calcium Oxalate Crystal FEW NONE OR FE /HPF   Hyaline Cast NONE SEEN NONE SEEN /LPF       Assessment & Plan:   1. Episcleritis of both eyes Continue with ophthalmology management.  2. Seropositive rheumatoid arthritis [facility]) Rheumatology has been involved again, with Humira recommended to start, and awaiting that start as delayed due to cost of the medicine. Hoping to be able to get started soon noted.  3. History of malnutrition As continue to improve, and today discussed healthy weight maintenance more than continuing to gain weight  over time.  4. History of depression Remains stable.  No recent increasing symptoms of concern.  5. Normocytic anemia 6. Thrombocytosis ([place]) Had involvement of hematology/oncology in the past, with the inflammation of his rheumatoid arthritis possibly contributing to some of his lab findings noted. We will recheck again on his follow-up visit at the latest, sooner as needed, especially if is planning to start the Humira with the importance of periodic labs noted if that is started.  7. PIN III (prostatic intraepithelial neoplasm III) Last PSA check was reasonable, continue to monitor. Had urology involved previously, and may need to get them involved again over time.   He has continued to do well in the recent past, with a tentative follow-up scheduled in 3 months at the latest, follow-up sooner as needed.     Towanda Malkin, MD 06/13/20 10:35 AM

## 2020-06-13 ENCOUNTER — Ambulatory Visit (INDEPENDENT_AMBULATORY_CARE_PROVIDER_SITE_OTHER): Payer: Medicare HMO | Admitting: Internal Medicine

## 2020-06-13 ENCOUNTER — Encounter: Payer: Self-pay | Admitting: Internal Medicine

## 2020-06-13 ENCOUNTER — Other Ambulatory Visit: Payer: Self-pay

## 2020-06-13 VITALS — BP 120/82 | HR 97 | Temp 97.8°F | Resp 16 | Ht 68.0 in | Wt 177.9 lb

## 2020-06-13 DIAGNOSIS — H15103 Unspecified episcleritis, bilateral: Secondary | ICD-10-CM | POA: Diagnosis not present

## 2020-06-13 DIAGNOSIS — M059 Rheumatoid arthritis with rheumatoid factor, unspecified: Secondary | ICD-10-CM | POA: Diagnosis not present

## 2020-06-13 DIAGNOSIS — Z8659 Personal history of other mental and behavioral disorders: Secondary | ICD-10-CM | POA: Diagnosis not present

## 2020-06-13 DIAGNOSIS — D649 Anemia, unspecified: Secondary | ICD-10-CM | POA: Diagnosis not present

## 2020-06-13 DIAGNOSIS — D75839 Thrombocytosis, unspecified: Secondary | ICD-10-CM

## 2020-06-13 DIAGNOSIS — Z8639 Personal history of other endocrine, nutritional and metabolic disease: Secondary | ICD-10-CM

## 2020-06-13 DIAGNOSIS — D075 Carcinoma in situ of prostate: Secondary | ICD-10-CM | POA: Diagnosis not present

## 2020-06-13 DIAGNOSIS — D473 Essential (hemorrhagic) thrombocythemia: Secondary | ICD-10-CM | POA: Diagnosis not present

## 2020-08-09 DIAGNOSIS — Z7952 Long term (current) use of systemic steroids: Secondary | ICD-10-CM | POA: Diagnosis not present

## 2020-08-09 DIAGNOSIS — H3322 Serous retinal detachment, left eye: Secondary | ICD-10-CM | POA: Diagnosis not present

## 2020-08-09 DIAGNOSIS — H25813 Combined forms of age-related cataract, bilateral: Secondary | ICD-10-CM | POA: Diagnosis not present

## 2020-08-09 DIAGNOSIS — H15033 Posterior scleritis, bilateral: Secondary | ICD-10-CM | POA: Diagnosis not present

## 2020-08-09 DIAGNOSIS — H35363 Drusen (degenerative) of macula, bilateral: Secondary | ICD-10-CM | POA: Diagnosis not present

## 2020-08-09 DIAGNOSIS — H43813 Vitreous degeneration, bilateral: Secondary | ICD-10-CM | POA: Diagnosis not present

## 2020-09-12 NOTE — Progress Notes (Signed)
   Patient ID: [name] Foster, male    DOB: 12/01/1940, 79 y.o.   MRN: [id number]  PCP: ,  D, MD  Chief Complaint  Patient presents with  . Follow-up    Subjective:   [name] Foster is a 79 y.o. male, presents to clinic with CC of the following:  Chief Complaint  Patient presents with  . Follow-up    Patient is a 79-year-old male Last visit was 06/13/2020 Follows up today with his wife present All in all, he notes he has been doing well.  He established with the practice 02/28/2020 after a prolonged hospitalization, with that note again reviewed, and wife discharge palliative care services 03/20/2020 after he had significantly improved.  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 Patient with known history of RF, previously was tried on MTX but had adverse reaction with failure to thrive. Significantly elevated inflammatory markes (ESR 83, CRP 7.64, RF 248, ANA 1:160 speckled pattern). Also c/o chronic joint swelling Infectious w/u negative.  Today Started Humira one month ago Resolved scleral injection OS>OD with resolved chemosis. No tenderness OU. Early thinning OU. MCP swelling much improved. Able to make a fist now and joints much better.  AC quiet OU. No vitreous haze or cell.  Excellent response to prednisone. Fluid resolved, VA improved to 20/32 and 20/40 OCT shows resolved fluid and choroidal folds and improved integrity of outer bands  Excellent response to Humira so far. Will continue to slowly taper prednisone.  Plan Prednisone to 5mg/day till next visit Stop Durezol Continue Humira q2 weekly Reviewed prednisone side effects  RV 8 weeks Dil/OCT and B Scan OU (Same day with Lisa Carnago)   Malnutrition history He has continued to gain weight, and  eating healthy.  Discussed healthy weight maintenance now in the future.  Wt Readings from Last 3 Encounters:  09/13/20 189 lb 11.2 oz (86 kg)  06/13/20 177 lb 14.4 oz (80.7 kg)  04/11/20 159 lb 4.8 oz (72.3 kg)     Normocytic anemia/thrombocytosis Patient was anemicin the past, with platelets increased Most recent CBC-showed was stable to slightly improving Last labs were checked in July at [facility], with those results as noted below  Denies CP, SOB, energy levels are good  Seropositive rheumatoid arthritis (HCC) He has a history of this, although he really seemed to decline after this diagnosis and methotrexate was started briefly.  They did get a follow-up call from rheumatology, after seeing the ophthalmologist, and are involved with the recommendations to starting Humira taken, and he is on Humira presently He follows up with rheumatology each time he goes to see ophthalmology.  Next follow-up scheduled in December.  PIN III (prostatic intraepithelial neoplasm III)/Elevated PSA prior urology notesstating carcinoma in situ of the prostate.He also has a family history of a brother and father with prostate cancer noted. he denies any concerning symptoms here recently. He had been on medication to manage LUTS in the past. Has had recent PSAs Has not returned to see urology in the recent past. Lab Results  Component Value Date   PSA1 1.9 08/06/2018   PSA 2.7 02/28/2020   History of depression Denied any depressive symptoms today, and is doing quite well.  Had been on a Zoloft product at one point,withno need to resume that presently PHQ today reviewed  Priorright   hip replacementJune 2020, left hip replacementmany yearsago,  Had the Covid vaccine, [employer], encouraged the booster (which I noted is really a second dose)       Patient Active Problem List   Diagnosis Date Noted  . Olecranon bursitis of both elbows 04/11/2020  . Tinea 04/11/2020  . Episcleritis  of both eyes 02/28/2020  . History of malnutrition 02/28/2020  . History of depression 02/28/2020  . Protein-calorie malnutrition, severe 06/26/2019  . Normocytic anemia 06/24/2019  . Thrombocytosis 06/24/2019  . Dysphagia 06/24/2019  . Palliative care by specialist 06/02/2019  . Iron deficiency anemia 06/02/2019  . 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 Generalized weakness 06/02/2019  . 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 Pneumonia 05/25/2019  . Sepsis (HCC)   . Goals of care, counseling/discussion   . DNR (do not resuscitate) discussion   . Atypical pneumonia   . H/O bilateral hip replacements 04/26/2019  . 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  . Incomplete emptying of bladder 02/03/2017  . 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 Abnormal finding on chest xray 07/18/2013  . Chronic cough 07/18/2013  . Tests ordered 09/03/2012  . 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 Benign localized prostatic hyperplasia with lower urinary tract symptoms (LUTS) 07/07/2012  . Elevated prostate specific antigen (PSA) 07/07/2012      Current Outpatient Medications:  .  Difluprednate 0.05 % EMUL, Apply to eye., Disp: , Rfl:  .  HUMIRA PEN-PSOR/UVEIT STARTER 80 MG/0.8ML & 40MG/0.4ML PNKT, , Disp: , Rfl:  .  Multiple Vitamin (MULTIVITAMIN WITH MINERALS) TABS tablet, Take 1 tablet by mouth daily., Disp:  , Rfl:  .  predniSONE (DELTASONE) 10 MG tablet, Take by mouth., Disp: , Rfl:    Allergies  Allergen Reactions  . Methotrexate     Other reaction(s): Other (See Comments) Weakness,      Past Surgical History:  Procedure Laterality Date  . ESOPHAGOGASTRODUODENOSCOPY N/A 06/26/2019   Procedure: ESOPHAGOGASTRODUODENOSCOPY (EGD);  Surgeon: Vanga, Rohini Reddy, MD;  Location: [facility] ENDOSCOPY;  Service: Gastroenterology;  Laterality: N/A;  . HEMORROIDECTOMY    . HERNIA REPAIR    . JOINT  REPLACEMENT Left    THR  . PROSTATE BIOPSY  2013  . TOTAL HIP ARTHROPLASTY Left   . TOTAL HIP ARTHROPLASTY Right 04/26/2019   Procedure: TOTAL HIP ARTHROPLASTY ANTERIOR APPROACH;  Surgeon: Menz, Michael, MD;  Location: [facility] ORS;  Service: Orthopedics;  Laterality: Right;     Family History  Problem Relation Age of Onset  . Prostate cancer Father   . Cancer Father   . Prostate cancer Brother   . Cancer Mother      Social History   Tobacco Use  . Smoking status: Former Smoker    Quit date: 06/04/1980    Years since quitting: 40.3  . Smokeless tobacco: Never Used  Substance Use Topics  . Alcohol use: No    With staff assistance, above reviewed with the patient today.  ROS: As per HPI, otherwise no specific complaints on a limited and focused system review   No results found for this or any previous visit (from the past 72 hour(s)).   PHQ2/9: Depression screen PHQ 2/9 09/13/2020 06/13/2020 06/13/2020 06/05/2020 04/11/2020  Decreased Interest 0 0 0 0 0  Down, Depressed, Hopeless 0 0 0 0 0  PHQ - 2 Score 0 0 0 0 0  Altered sleeping - 0 0 - 0  Tired, decreased   energy - 0 0 - 0  Change in appetite - 0 0 - 0  Feeling bad or failure about yourself  - 0 0 - 0  Trouble concentrating - 0 0 - 0  Moving slowly or fidgety/restless - 0 0 - 0  Suicidal thoughts - 0 0 - 0  PHQ-9 Score - 0 0 - 0  Difficult doing work/chores - Not difficult at all Not difficult at all - Not difficult at all   PHQ-2/9 Result is neg  Fall Risk: Fall Risk  09/13/2020 06/13/2020 06/05/2020 04/11/2020 02/28/2020  Falls in the past year? 0 1 1 1 1  Number falls in past yr: 0 1 1 1 1  Injury with Fall? 0 1 1 1 0  Risk for fall due to : - - History of fall(s) - -  Follow up - - Falls prevention discussed - -      Objective:   Vitals:   09/13/20 0909  BP: 130/80  Pulse: 91  Resp: 16  Temp: 98.1 F (36.7 C)  TempSrc: Oral  SpO2: 96%  Weight: 189 lb 11.2 oz (86 kg)  Height: 5' 8" (1.727 m)    Body mass  index is 28.84 kg/m.  Physical Exam   NAD, masked, looks well, very pleasant HEENT - [place]/AT, sclera anicteric, positive arcus, positive glasses, PERRL, EOMI, conj - non-inj'ed bilaterally except some minimal redness noted on the inner aspect of the left medial to the iris much more than the right eye, pharynx clear Neck - supple, no adenopathy, carotids 2+ and = without bruits bilat Car - RRR without m/g/r Pulm- RR and effort normal at rest, CTA without wheeze or rales Abd - soft, mildly protuberant, NT diffusely, ND,  Back - no CVA tenderness Ext - no LE edema, no active joints presently in the hands, with good grip strength today Neuro/psychiatric - affect was not flat, appropriate with conversation      Alert       Grossly non-focal - good strength on testing extremities, sensation intact to LT in distal extremities,       Speech normal  Results for orders placed or performed in visit on 02/28/20  CBC with Differential/Platelet  Result Value Ref Range   WBC 4.4 3.8 - 10.8 Thousand/uL   RBC 3.68 (L) 4.20 - 5.80 Million/uL   Hemoglobin 9.3 (L) 13.2 - 17.1 g/dL   HCT 29.4 (L) 38 - 50 %   MCV 79.9 (L) 80.0 - 100.0 fL   MCH 25.3 (L) 27.0 - 33.0 pg   MCHC 31.6 (L) 32.0 - 36.0 g/dL   RDW 16.5 (H) 11.0 - 15.0 %   Platelets 417 (H) 140 - 400 Thousand/uL   MPV 9.6 7.5 - 12.5 fL   Neutro Abs 2,284 1,500 - 7,800 cells/uL   Lymphs Abs 1,615 850 - 3,900 cells/uL   Absolute Monocytes 361 200 - 950 cells/uL   Eosinophils Absolute 119 15.0 - 500.0 cells/uL   Basophils Absolute 22 0.0 - 200.0 cells/uL   Neutrophils Relative % 51.9 %   Total Lymphocyte 36.7 %   Monocytes Relative 8.2 %   Eosinophils Relative 2.7 %   Basophils Relative 0.5 %  COMPLETE METABOLIC PANEL WITH GFR  Result Value Ref Range   Glucose, Bld 126 (H) 65 - 99 mg/dL   BUN 14 7 - 25 mg/dL   Creat 0.67 (L) 0.70 - 1.18 mg/dL   GFR, Est Non African American 92 > OR = 60 mL/min/1.73m2     GFR, Est African American 107 > OR =  60 mL/min/1.73m2   BUN/Creatinine Ratio 21 6 - 22 (calc)   Sodium 137 135 - 146 mmol/L   Potassium 4.1 3.5 - 5.3 mmol/L   Chloride 100 98 - 110 mmol/L   CO2 30 20 - 32 mmol/L   Calcium 8.7 8.6 - 10.3 mg/dL   Total Protein 8.0 6.1 - 8.1 g/dL   Albumin 2.9 (L) 3.6 - 5.1 g/dL   Globulin 5.1 (H) 1.9 - 3.7 g/dL (calc)   AG Ratio 0.6 (L) 1.0 - 2.5 (calc)   Total Bilirubin 0.2 0.2 - 1.2 mg/dL   Alkaline phosphatase (APISO) 97 35 - 144 U/L   AST 17 10 - 35 U/L   ALT 11 9 - 46 U/L  PSA  Result Value Ref Range   PSA 2.7 < OR = 4.0 ng/mL  Urinalysis, Complete  Result Value Ref Range   Color, Urine DARK YELLOW YELLOW   APPearance CLOUDY (A) CLEAR   Specific Gravity, Urine 1.037 (H) 1.001 - 1.03   pH < OR = 5.0 5.0 - 8.0   Glucose, UA NEGATIVE NEGATIVE   Bilirubin Urine NEGATIVE NEGATIVE   Ketones, ur TRACE (A) NEGATIVE   Hgb urine dipstick 3+ (A) NEGATIVE   Protein, ur 1+ (A) NEGATIVE   Nitrite NEGATIVE NEGATIVE   Leukocytes,Ua TRACE (A) NEGATIVE   WBC, UA 0-5 0 - 5 /HPF   RBC / HPF PACKED (A) 0 - 2 /HPF   Squamous Epithelial / LPF 0-5 < OR = 5 /HPF   TRANSITIONAL EPITHELIAL CELLS 0-5 < OR = 5 /HPF   Bacteria, UA NONE SEEN NONE SEEN /HPF   Calcium Oxalate Crystal FEW NONE OR FE /HPF   Hyaline Cast NONE SEEN NONE SEEN /LPF   Last labs 05/17/20 in [facility] system  - CBC Hgb - 12.8/Hct-41.1, WBC - 5.6 Chem profile - reviewed, normal kidney function and liver tests Had many other tests in July noted as well in the [facility] system     Assessment & Plan:    1. Scleritis of both eyes Continues to see ophthalmology, with next visit planned in December  2. Seropositive rheumatoid arthritis (HCC) Sees rheumatology when he sees the ophthalmologist and continues on Humira, and presently on 5 mg of prednisone.  Continue to follow with them.  3. Normocytic anemia 4. Thrombocytosis Did have a recent CBC in July with significant improvements noted. Continue to follow presently.  5. PIN III  (prostatic intraepithelial neoplasm III) Last PSA check was reasonable, continue to monitor. Had urology involved previously, and may need to get them involved again over time.  6. History of depression Remained stable with no increasing symptoms of concern in the recent past.  7. History of malnutrition As continues to improve, today discussed healthy weight maintenance more than continuing to gain weight over time.  Will not check labs today as just had a complete panel done in July with his [facility] physicians, Encouraged him to get the second Covid dose/booster Tentatively, we will schedule follow-up in approximately 6 months time, sooner as needed. I did inform them today that I will not be here at that time, as I am leaving the practice before that visit. Follow-up sooner as needed.     D , MD 09/13/20 9:34 AM  

## 2020-09-13 ENCOUNTER — Encounter: Payer: Self-pay | Admitting: Internal Medicine

## 2020-09-13 ENCOUNTER — Ambulatory Visit (INDEPENDENT_AMBULATORY_CARE_PROVIDER_SITE_OTHER): Payer: Medicare HMO | Admitting: Internal Medicine

## 2020-09-13 ENCOUNTER — Other Ambulatory Visit: Payer: Self-pay

## 2020-09-13 VITALS — BP 130/80 | HR 91 | Temp 98.1°F | Resp 16 | Ht 68.0 in | Wt 189.7 lb

## 2020-09-13 DIAGNOSIS — D075 Carcinoma in situ of prostate: Secondary | ICD-10-CM

## 2020-09-13 DIAGNOSIS — Z8659 Personal history of other mental and behavioral disorders: Secondary | ICD-10-CM | POA: Diagnosis not present

## 2020-09-13 DIAGNOSIS — H15003 Unspecified scleritis, bilateral: Secondary | ICD-10-CM

## 2020-09-13 DIAGNOSIS — D75839 Thrombocytosis, unspecified: Secondary | ICD-10-CM

## 2020-09-13 DIAGNOSIS — D649 Anemia, unspecified: Secondary | ICD-10-CM | POA: Diagnosis not present

## 2020-09-13 DIAGNOSIS — Z8639 Personal history of other endocrine, nutritional and metabolic disease: Secondary | ICD-10-CM | POA: Diagnosis not present

## 2020-09-13 DIAGNOSIS — M059 Rheumatoid arthritis with rheumatoid factor, unspecified: Secondary | ICD-10-CM | POA: Diagnosis not present

## 2020-10-18 DIAGNOSIS — H35363 Drusen (degenerative) of macula, bilateral: Secondary | ICD-10-CM | POA: Diagnosis not present

## 2020-10-18 DIAGNOSIS — H25813 Combined forms of age-related cataract, bilateral: Secondary | ICD-10-CM | POA: Diagnosis not present

## 2020-10-18 DIAGNOSIS — J431 Panlobular emphysema: Secondary | ICD-10-CM | POA: Diagnosis not present

## 2020-10-18 DIAGNOSIS — Z23 Encounter for immunization: Secondary | ICD-10-CM | POA: Diagnosis not present

## 2020-10-18 DIAGNOSIS — J849 Interstitial pulmonary disease, unspecified: Secondary | ICD-10-CM | POA: Diagnosis not present

## 2020-10-18 DIAGNOSIS — H3322 Serous retinal detachment, left eye: Secondary | ICD-10-CM | POA: Diagnosis not present

## 2020-10-18 DIAGNOSIS — H15013 Anterior scleritis, bilateral: Secondary | ICD-10-CM | POA: Diagnosis not present

## 2020-10-18 DIAGNOSIS — D708 Other neutropenia: Secondary | ICD-10-CM | POA: Diagnosis not present

## 2020-10-18 DIAGNOSIS — Z79899 Other long term (current) drug therapy: Secondary | ICD-10-CM | POA: Diagnosis not present

## 2020-10-18 DIAGNOSIS — M059 Rheumatoid arthritis with rheumatoid factor, unspecified: Secondary | ICD-10-CM | POA: Diagnosis not present

## 2020-10-18 DIAGNOSIS — H15033 Posterior scleritis, bilateral: Secondary | ICD-10-CM | POA: Diagnosis not present

## 2020-10-18 DIAGNOSIS — Z7952 Long term (current) use of systemic steroids: Secondary | ICD-10-CM | POA: Diagnosis not present

## 2020-10-18 DIAGNOSIS — H43813 Vitreous degeneration, bilateral: Secondary | ICD-10-CM | POA: Diagnosis not present

## 2020-10-18 DIAGNOSIS — R7982 Elevated C-reactive protein (CRP): Secondary | ICD-10-CM | POA: Diagnosis not present

## 2021-01-10 DIAGNOSIS — J849 Interstitial pulmonary disease, unspecified: Secondary | ICD-10-CM | POA: Diagnosis not present

## 2021-01-10 DIAGNOSIS — H15013 Anterior scleritis, bilateral: Secondary | ICD-10-CM | POA: Diagnosis not present

## 2021-01-10 DIAGNOSIS — M0579 Rheumatoid arthritis with rheumatoid factor of multiple sites without organ or systems involvement: Secondary | ICD-10-CM | POA: Diagnosis not present

## 2021-01-10 DIAGNOSIS — H3562 Retinal hemorrhage, left eye: Secondary | ICD-10-CM | POA: Diagnosis not present

## 2021-01-10 DIAGNOSIS — R7982 Elevated C-reactive protein (CRP): Secondary | ICD-10-CM | POA: Diagnosis not present

## 2021-01-10 DIAGNOSIS — H35363 Drusen (degenerative) of macula, bilateral: Secondary | ICD-10-CM | POA: Diagnosis not present

## 2021-01-10 DIAGNOSIS — H35052 Retinal neovascularization, unspecified, left eye: Secondary | ICD-10-CM | POA: Diagnosis not present

## 2021-01-10 DIAGNOSIS — M059 Rheumatoid arthritis with rheumatoid factor, unspecified: Secondary | ICD-10-CM | POA: Diagnosis not present

## 2021-01-10 DIAGNOSIS — Z79899 Other long term (current) drug therapy: Secondary | ICD-10-CM | POA: Diagnosis not present

## 2021-01-10 DIAGNOSIS — H43813 Vitreous degeneration, bilateral: Secondary | ICD-10-CM | POA: Diagnosis not present

## 2021-01-10 DIAGNOSIS — H15033 Posterior scleritis, bilateral: Secondary | ICD-10-CM | POA: Diagnosis not present

## 2021-01-19 IMAGING — XA OPERATIVE RIGHT HIP WITH PELVIS
2 series · 5 of 5 positions shown · non-contrast
Comparison: None.

CLINICAL DATA: Primary osteoarthritis of the right hip. Right total
hip arthroplasty.

EXAM:
OPERATIVE RIGHT HIP (WITH PELVIS IF PERFORMED)  VIEWS
TECHNIQUE: Fluoroscopic spot image(s) were submitted for interpretation
post-operatively.

[Series 2: ortho standard · 1 of 1 slices shown (1 of 2)]
[im 1/1]
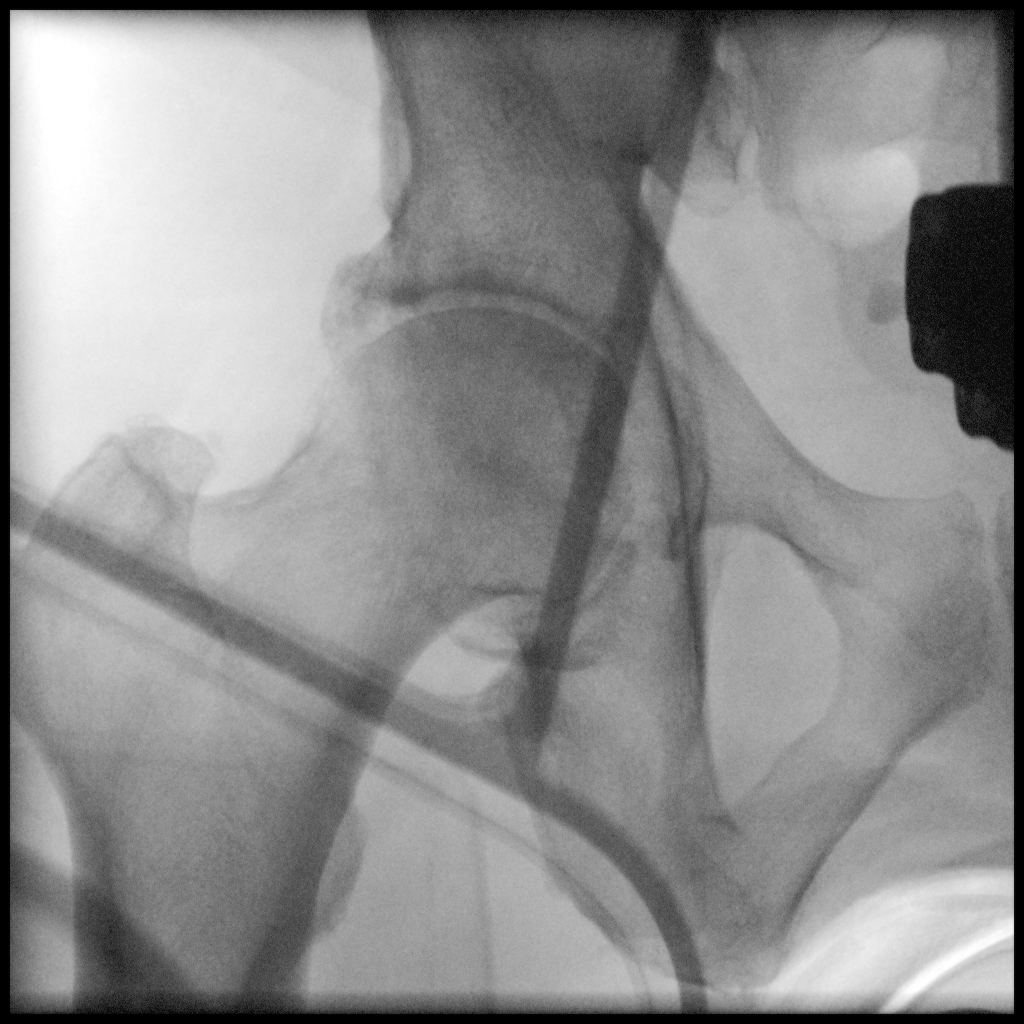

[Series 6: ortho standard · 4 of 9 frames shown (2 of 2)]
[frame 1/9]
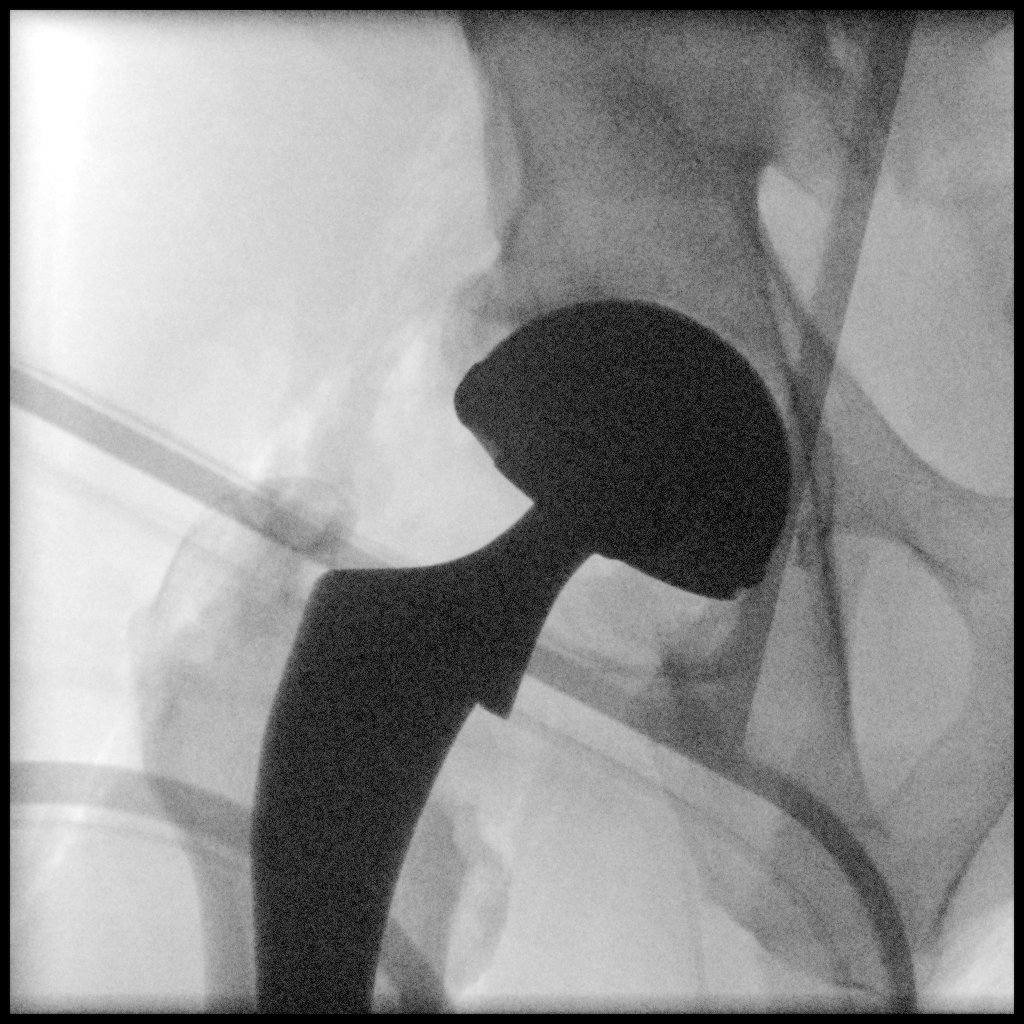
[frame 2/9]
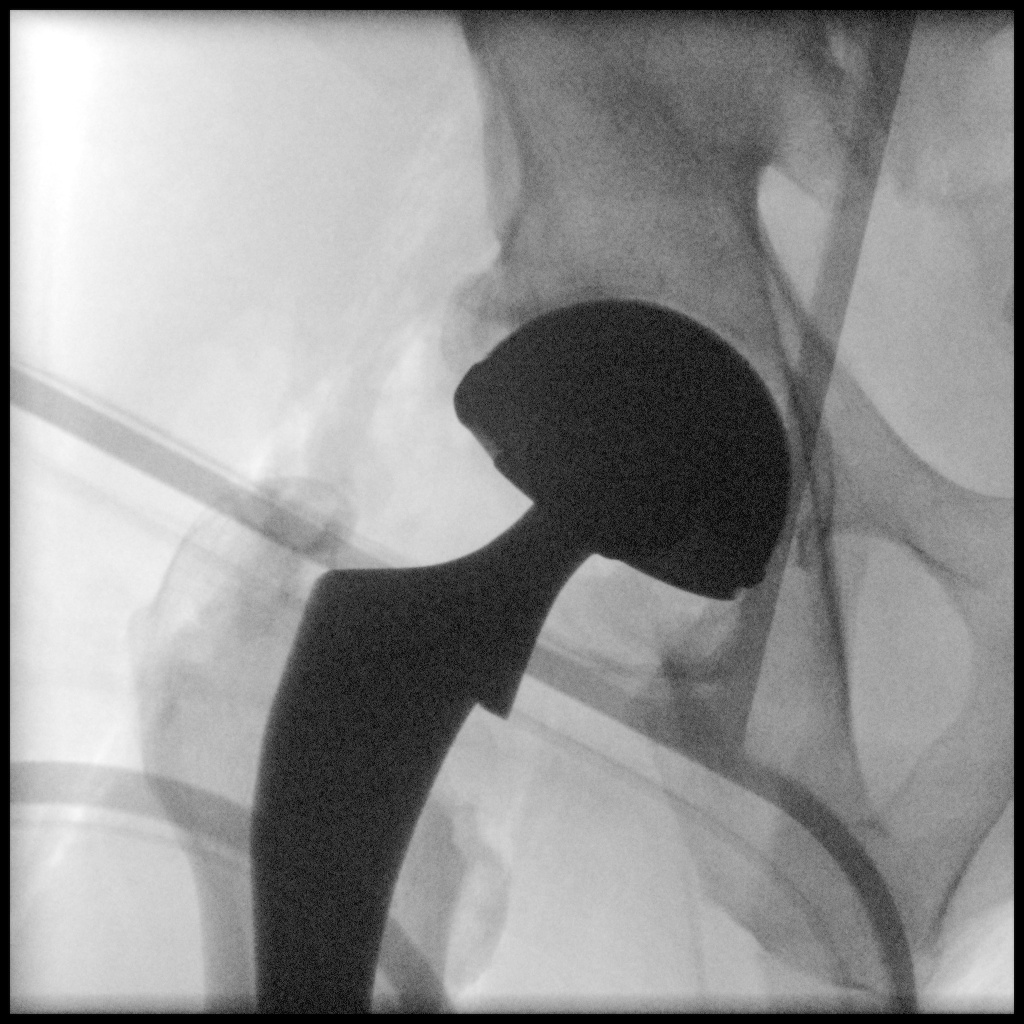
[frame 5/9]
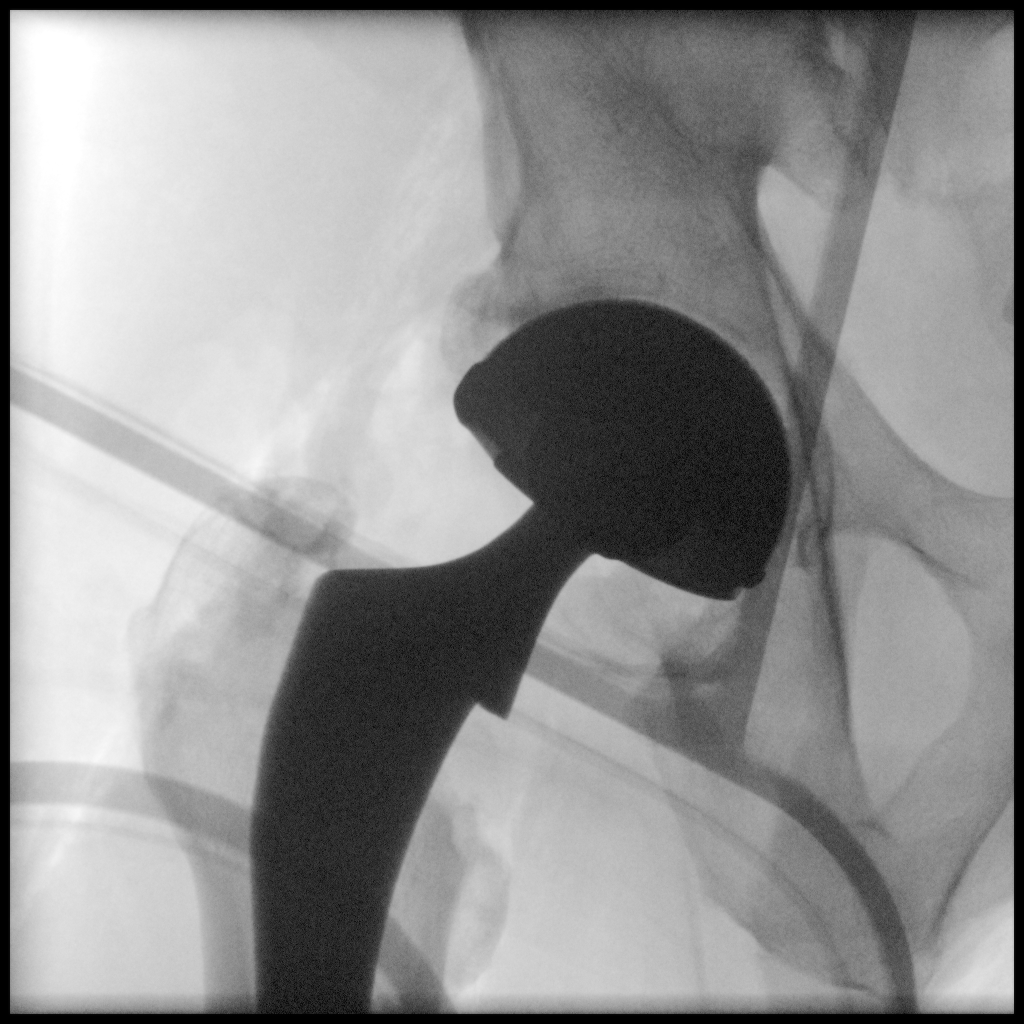
[frame 8/9]
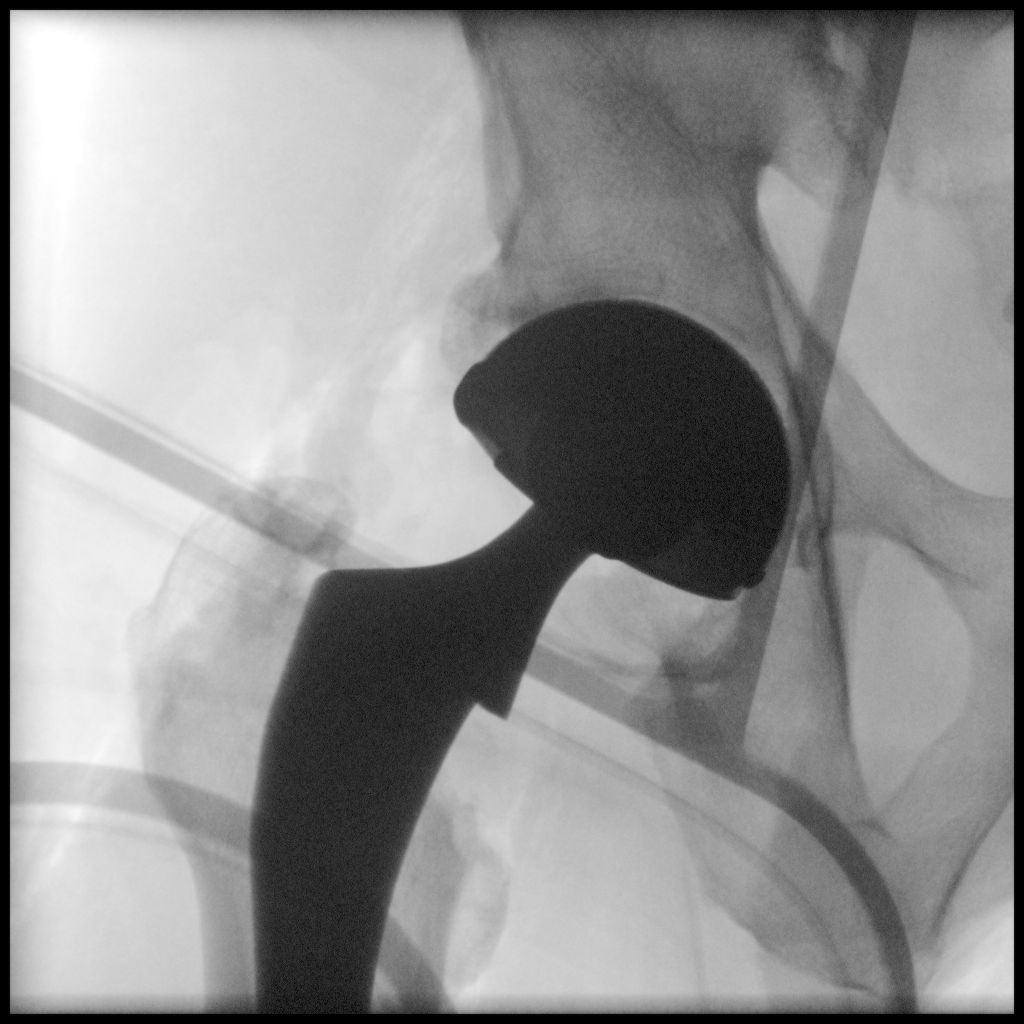

[5 of 5 positions shown; findings below may reference images not displayed]

FINDINGS: Ap C-arm images before and after hip arthroplasty demonstrate that
the acetabular component appears in good position. The visualized
portion of the femoral component also appears in good position.
IMPRESSION: Right total hip arthroplasty as described.

## 2021-03-14 DIAGNOSIS — D709 Neutropenia, unspecified: Secondary | ICD-10-CM | POA: Diagnosis not present

## 2021-04-11 DIAGNOSIS — H35363 Drusen (degenerative) of macula, bilateral: Secondary | ICD-10-CM | POA: Diagnosis not present

## 2021-04-11 DIAGNOSIS — H35052 Retinal neovascularization, unspecified, left eye: Secondary | ICD-10-CM | POA: Diagnosis not present

## 2021-04-11 DIAGNOSIS — H3562 Retinal hemorrhage, left eye: Secondary | ICD-10-CM | POA: Diagnosis not present

## 2021-04-11 DIAGNOSIS — J431 Panlobular emphysema: Secondary | ICD-10-CM | POA: Diagnosis not present

## 2021-04-11 DIAGNOSIS — M0579 Rheumatoid arthritis with rheumatoid factor of multiple sites without organ or systems involvement: Secondary | ICD-10-CM | POA: Diagnosis not present

## 2021-04-11 DIAGNOSIS — H15013 Anterior scleritis, bilateral: Secondary | ICD-10-CM | POA: Diagnosis not present

## 2021-04-11 DIAGNOSIS — H15033 Posterior scleritis, bilateral: Secondary | ICD-10-CM | POA: Diagnosis not present

## 2021-04-11 DIAGNOSIS — Z79899 Other long term (current) drug therapy: Secondary | ICD-10-CM | POA: Diagnosis not present

## 2021-04-11 DIAGNOSIS — H25813 Combined forms of age-related cataract, bilateral: Secondary | ICD-10-CM | POA: Diagnosis not present

## 2021-04-11 DIAGNOSIS — D708 Other neutropenia: Secondary | ICD-10-CM | POA: Diagnosis not present

## 2021-04-11 DIAGNOSIS — M059 Rheumatoid arthritis with rheumatoid factor, unspecified: Secondary | ICD-10-CM | POA: Diagnosis not present

## 2021-04-11 DIAGNOSIS — J849 Interstitial pulmonary disease, unspecified: Secondary | ICD-10-CM | POA: Diagnosis not present

## 2021-04-11 DIAGNOSIS — H43813 Vitreous degeneration, bilateral: Secondary | ICD-10-CM | POA: Diagnosis not present

## 2021-04-23 DIAGNOSIS — H2513 Age-related nuclear cataract, bilateral: Secondary | ICD-10-CM | POA: Diagnosis not present

## 2021-04-23 DIAGNOSIS — Z0100 Encounter for examination of eyes and vision without abnormal findings: Secondary | ICD-10-CM | POA: Diagnosis not present

## 2021-05-07 DIAGNOSIS — H318 Other specified disorders of choroid: Secondary | ICD-10-CM | POA: Diagnosis not present

## 2021-06-04 DIAGNOSIS — H3562 Retinal hemorrhage, left eye: Secondary | ICD-10-CM | POA: Diagnosis not present

## 2021-06-04 DIAGNOSIS — H2511 Age-related nuclear cataract, right eye: Secondary | ICD-10-CM | POA: Diagnosis not present

## 2021-06-05 ENCOUNTER — Encounter: Payer: Self-pay | Admitting: Ophthalmology

## 2021-06-06 ENCOUNTER — Ambulatory Visit (INDEPENDENT_AMBULATORY_CARE_PROVIDER_SITE_OTHER): Payer: Medicare HMO

## 2021-06-06 DIAGNOSIS — Z0000 Encounter for general adult medical examination without abnormal findings: Secondary | ICD-10-CM

## 2021-06-06 NOTE — Patient Instructions (Signed)
[name] Foster , Thank you for taking time to come for your Medicare Wellness Visit. I appreciate your ongoing commitment to your health goals. Please review the following plan we discussed and let me know if I can assist you in the future.   Screening recommendations/referrals: Colonoscopy: no longer required Recommended yearly ophthalmology/optometry visit for glaucoma screening and checkup Recommended yearly dental visit for hygiene and checkup  Vaccinations: Influenza vaccine: declined Pneumococcal vaccine: done 03/03/14 Tdap vaccine: due Shingles vaccine: Shingrix discussed. Please contact your pharmacy for coverage information.   Covid-19: done 08/15/20  Conditions/risks identified: Keep up the great work!  Next appointment: Follow up in one year for your annual wellness visit.   Preventive Care 80 Years and Older, Male Preventive care refers to lifestyle choices and visits with your health care provider that can promote health and wellness. What does preventive care include? A yearly physical exam. This is also called an annual well check. Dental exams once or twice a year. Routine eye exams. Ask your health care provider how often you should have your eyes checked. Personal lifestyle choices, including: Daily care of your teeth and gums. Regular physical activity. Eating a healthy diet. Avoiding tobacco and drug use. Limiting alcohol use. Practicing safe sex. Taking low doses of aspirin every day. Taking vitamin and mineral supplements as recommended by your health care provider. What happens during an annual well check? The services and screenings done by your health care provider during your annual well check will depend on your age, overall health, lifestyle risk factors, and family history of disease. Counseling  Your health care provider may ask you questions about your: Alcohol use. Tobacco use. Drug use. Emotional well-being. Home and relationship  well-being. Sexual activity. Eating habits. History of falls. Memory and ability to understand (cognition). Work and work Statistician. Screening  You may have the following tests or measurements: Height, weight, and BMI. Blood pressure. Lipid and cholesterol levels. These may be checked every 5 years, or more frequently if you are over 7 years old. Skin check. Lung cancer screening. You may have this screening every year starting at age 75 if you have a 30-pack-year history of smoking and currently smoke or have quit within the past 15 years. Fecal occult blood test (FOBT) of the stool. You may have this test every year starting at age 68. Flexible sigmoidoscopy or colonoscopy. You may have a sigmoidoscopy every 5 years or a colonoscopy every 10 years starting at age 51. Prostate cancer screening. Recommendations will vary depending on your family history and other risks. Hepatitis C blood test. Hepatitis B blood test. Sexually transmitted disease (STD) testing. Diabetes screening. This is done by checking your blood sugar (glucose) after you have not eaten for a while (fasting). You may have this done every 1-3 years. Abdominal aortic aneurysm (AAA) screening. You may need this if you are a current or former smoker. Osteoporosis. You may be screened starting at age 31 if you are at high risk. Talk with your health care provider about your test results, treatment options, and if necessary, the need for more tests. Vaccines  Your health care provider may recommend certain vaccines, such as: Influenza vaccine. This is recommended every year. Tetanus, diphtheria, and acellular pertussis (Tdap, Td) vaccine. You may need a Td booster every 10 years. Zoster vaccine. You may need this after age 29. Pneumococcal 13-valent conjugate (PCV13) vaccine. One dose is recommended after age 15. Pneumococcal polysaccharide (PPSV23) vaccine. One dose is recommended after age 34.  Talk to your health care  provider about which screenings and vaccines you need and how often you need them. This information is not intended to replace advice given to you by your health care provider. Make sure you discuss any questions you have with your health care provider. Document Released: 11/23/2015 Document Revised: 07/16/2016 Document Reviewed: 08/28/2015 Elsevier Interactive Patient Education  2017 [place] Prevention in the Home Falls can cause injuries. They can happen to people of all ages. There are many things you can do to make your home safe and to help prevent falls. What can I do on the outside of my home? Regularly fix the edges of walkways and driveways and fix any cracks. Remove anything that might make you trip as you walk through a door, such as a raised step or threshold. Trim any bushes or trees on the path to your home. Use bright outdoor lighting. Clear any walking paths of anything that might make someone trip, such as rocks or tools. Regularly check to see if handrails are loose or broken. Make sure that both sides of any steps have handrails. Any raised decks and porches should have guardrails on the edges. Have any leaves, snow, or ice cleared regularly. Use sand or salt on walking paths during winter. Clean up any spills in your garage right away. This includes oil or grease spills. What can I do in the bathroom? Use night lights. Install grab bars by the toilet and in the tub and shower. Do not use towel bars as grab bars. Use non-skid mats or decals in the tub or shower. If you need to sit down in the shower, use a plastic, non-slip stool. Keep the floor dry. Clean up any water that spills on the floor as soon as it happens. Remove soap buildup in the tub or shower regularly. Attach bath mats securely with double-sided non-slip rug tape. Do not have throw rugs and other things on the floor that can make you trip. What can I do in the bedroom? Use night lights. Make  sure that you have a light by your bed that is easy to reach. Do not use any sheets or blankets that are too big for your bed. They should not hang down onto the floor. Have a firm chair that has side arms. You can use this for support while you get dressed. Do not have throw rugs and other things on the floor that can make you trip. What can I do in the kitchen? Clean up any spills right away. Avoid walking on wet floors. Keep items that you use a lot in easy-to-reach places. If you need to reach something above you, use a strong step stool that has a grab bar. Keep electrical cords out of the way. Do not use floor polish or wax that makes floors slippery. If you must use wax, use non-skid floor wax. Do not have throw rugs and other things on the floor that can make you trip. What can I do with my stairs? Do not leave any items on the stairs. Make sure that there are handrails on both sides of the stairs and use them. Fix handrails that are broken or loose. Make sure that handrails are as long as the stairways. Check any carpeting to make sure that it is firmly attached to the stairs. Fix any carpet that is loose or worn. Avoid having throw rugs at the top or bottom of the stairs. If you do have throw  rugs, attach them to the floor with carpet tape. Make sure that you have a light switch at the top of the stairs and the bottom of the stairs. If you do not have them, ask someone to add them for you. What else can I do to help prevent falls? Wear shoes that: Do not have high heels. Have rubber bottoms. Are comfortable and fit you well. Are closed at the toe. Do not wear sandals. If you use a stepladder: Make sure that it is fully opened. Do not climb a closed stepladder. Make sure that both sides of the stepladder are locked into place. Ask someone to hold it for you, if possible. Clearly mark and make sure that you can see: Any grab bars or handrails. First and last steps. Where the  edge of each step is. Use tools that help you move around (mobility aids) if they are needed. These include: Canes. Walkers. Scooters. Crutches. Turn on the lights when you go into a dark area. Replace any light bulbs as soon as they burn out. Set up your furniture so you have a clear path. Avoid moving your furniture around. If any of your floors are uneven, fix them. If there are any pets around you, be aware of where they are. Review your medicines with your doctor. Some medicines can make you feel dizzy. This can increase your chance of falling. Ask your doctor what other things that you can do to help prevent falls. This information is not intended to replace advice given to you by your health care provider. Make sure you discuss any questions you have with your health care provider. Document Released: 08/23/2009 Document Revised: 04/03/2016 Document Reviewed: 12/01/2014 Elsevier Interactive Patient Education  2017 [employer].

## 2021-06-06 NOTE — Progress Notes (Signed)
Subjective:   [name] Foster is a 80 y.o. male who presents for Medicare Annual/Subsequent preventive examination.  Virtual Visit via Telephone Note  I connected with  [name] Foster on 06/06/21 at 10:00 AM EDT by telephone and verified that I am speaking with the correct person using two identifiers.  Location: Patient: home Provider: Willey Persons participating in the virtual visit: [place]   I discussed the limitations, risks, security and privacy concerns of performing an evaluation and management service by telephone and the availability of in person appointments. The patient expressed understanding and agreed to proceed.  Interactive audio and video telecommunications were attempted between this nurse and patient, however failed, due to patient having technical difficulties OR patient did not have access to video capability.  We continued and completed visit with audio only.  Some vital signs may be absent or patient reported.   Clemetine Marker, LPN   Review of Systems     Cardiac Risk Factors include: advanced age (>88mn, >>81women);male gender     Objective:    There were no vitals filed for this visit. There is no height or weight on file to calculate BMI.  Advanced Directives 06/06/2021 06/05/2020 09/17/2019 07/19/2019 06/24/2019 06/24/2019 06/24/2019  Does Patient Have a Medical Advance Directive? Yes Yes No - No No No  Type of AParamedicof ALas Pal[place] will H[place]Living will - - - - -  Copy of [place]? Yes - validated most recent copy scanned in chart (See row information) Yes - validated most recent copy scanned in chart (See row information) - - - - -  Would patient like information on creating a medical advance directive? - - - No - Patient declined No - Patient declined - No - Patient declined    Current Medications (verified) Outpatient Encounter Medications as  of 06/06/2021  Medication Sig   HUMIRA PEN-PSOR/UVEIT STARTER 80 MG/0.8ML & '40MG'$ /0.4ML PNKT    Multiple Vitamin (MULTIVITAMIN WITH MINERALS) TABS tablet Take 1 tablet by mouth daily.   [DISCONTINUED] Difluprednate 0.05 % EMUL Apply to eye.   No facility-administered encounter medications on file as of 06/06/2021.    Allergies (verified) Methotrexate   History: Past Medical History:  Diagnosis Date   Arthritis    BPH (benign prostatic hyperplasia)    Former smoker, stopped smoking many years ago    History of needle biopsy of prostate with negative result 2013   Hypertension    Prostate enlargement    Prostatic intraepithelial neoplasm III 2013   Past Surgical History:  Procedure Laterality Date   ESOPHAGOGASTRODUODENOSCOPY N/A 06/26/2019   Procedure: ESOPHAGOGASTRODUODENOSCOPY (EGD);  Surgeon: VLin Landsman MD;  [facility];  Service: Gastroenterology;  Laterality: N/A;   HEMORROIDECTOMY     HERNIA REPAIR     JOINT REPLACEMENT Left    THR   PROSTATE BIOPSY  2013   TOTAL HIP ARTHROPLASTY Left    TOTAL HIP ARTHROPLASTY Right 04/26/2019   Procedure: TOTAL HIP ARTHROPLASTY ANTERIOR APPROACH;  Surgeon: MHessie Knows MD;  Location: [facility] ORS;  Service: Orthopedics;  Laterality: Right;   Family History  Problem Relation Age of Onset   Prostate cancer Father    Cancer Father    Prostate cancer Brother    Cancer Mother    Social History   Socioeconomic History   Marital status: Married    Spouse name: Not on file   Number of children: 4  Years of education: Not on file   Highest education level: Not on file  Occupational History   Not on file  Tobacco Use   Smoking status: Former    Types: Cigarettes    Quit date: 06/04/1980    Years since quitting: 41.0   Smokeless tobacco: Never  Vaping Use   Vaping Use: Never used  Substance and Sexual Activity   Alcohol use: No   Drug use: No   Sexual activity: Yes  Other Topics Concern   Not on file   Social History Narrative   Not on file   Social Determinants of Health   Financial Resource Strain: Low Risk    Difficulty of Paying Living Expenses: Not hard at all  Food Insecurity: No Food Insecurity   Worried About Charity fundraiser in the Last Year: Never true   [place] in the Last Year: Never true  Transportation Needs: No Transportation Needs   Lack of Transportation (Medical): No   Lack of Transportation (Non-Medical): No  Physical Activity: Sufficiently Active   Days of Exercise per Week: 7 days   Minutes of Exercise per Session: 30 min  Stress: No Stress Concern Present   Feeling of Stress : Not at all  Social Connections: Moderately Integrated   Frequency of Communication with Friends and Family: More than three times a week   Frequency of Social Gatherings with Friends and Family: Three times a week   Attends Religious Services: More than 4 times per year   Active Member of Clubs or Organizations: No   Attends Archivist Meetings: Never   Marital Status: Married    Tobacco Counseling Counseling given: Not Answered   Clinical Intake:  Pre-visit preparation completed: Yes  Pain : No/denies pain     Nutritional Risks: None Diabetes: No  How often do you need to have someone help you when you read instructions, pamphlets, or other written materials from your doctor or pharmacy?: 1 - Never    Interpreter Needed?: No  Information entered by :: Clemetine Marker LPN   Activities of Daily Living In your present state of health, do you have any difficulty performing the following activities: 06/06/2021 09/13/2020  Hearing? N N  Comment declines hearing aids -  Vision? Y N  Difficulty concentrating or making decisions? N N  Walking or climbing stairs? N N  Dressing or bathing? N N  Doing errands, shopping? N N  Preparing Food and eating ? N -  Using the Toilet? N -  In the past six months, have you accidently leaked urine? N -  Do you  have problems with loss of bowel control? N -  Managing your Medications? N -  Managing your Finances? N -  Housekeeping or managing your Housekeeping? N -  Some recent data might be hidden    Patient Care Team: [facility] as PCP - General (Family Medicine) Janna Arch, MD (Ophthalmology) Raelyn Number, Lyn Records, NP as Nurse Practitioner (Rheumatology)  Indicate any recent Medical Services you may have received from other than [facility] providers in the past year (date may be approximate).     Assessment:   This is a routine wellness examination for Leif.  Hearing/Vision screen Hearing Screening - Comments:: Pt denies hearing difficulty Vision Screening - Comments:: Annual vision screenings with [facility]; upcoming cataract surgery   Dietary issues and exercise activities discussed: Current Exercise Habits: Home exercise routine, Type of exercise: walking, Time (Minutes):  30, Frequency (Times/Week): 7, Weekly Exercise (Minutes/Week): 210, Intensity: Mild, Exercise limited by: Other - see comments (RA)   Goals Addressed   None    Depression Screen PHQ 2/9 Scores 06/06/2021 09/13/2020 06/13/2020 06/13/2020 06/05/2020 04/11/2020 02/28/2020  PHQ - 2 Score 0 0 0 0 0 0 0  PHQ- 9 Score - - 0 0 - 0 0    Fall Risk Fall Risk  06/06/2021 09/13/2020 06/13/2020 06/05/2020 04/11/2020  Falls in the past year? 0 0 '1 1 1  '$ Number falls in past yr: 0 0 '1 1 1  '$ Injury with Fall? 0 0 '1 1 1  '$ Risk for fall due to : No Fall Risks - - History of fall(s) -  Follow up Falls prevention discussed - - Falls prevention discussed -    FALL RISK PREVENTION PERTAINING TO THE HOME:  Any stairs in or around the home? Yes  If so, are there any without handrails? No  Home free of loose throw rugs in walkways, pet beds, electrical cords, etc? Yes  Adequate lighting in your home to reduce risk of falls? Yes   ASSISTIVE DEVICES UTILIZED TO PREVENT FALLS:  Life alert? No  Use of a cane, walker  or w/c? No  Grab bars in the bathroom? Yes  Shower chair or bench in shower? Yes  Elevated toilet seat or a handicapped toilet? Yes   TIMED UP AND GO:  Was the test performed? No . Telephonic visit  Cognitive Function: Normal cognitive status assessed by direct observation by this Nurse Health Advisor. No abnormalities found.       6CIT Screen 06/05/2020  What Year? 0 points  What month? 0 points  What time? 0 points  Count back from 20 0 points  Months in reverse 0 points  Repeat phrase 2 points  Total Score 2    Immunizations Immunization History  Administered Date(s) Administered   Influenza, Seasonal, Injecte, Preservative Fre 09/02/2013   Janssen (J&J) SARS-COV-2 Vaccination 08/15/2020   Pneumococcal Conjugate-13 03/03/2014    TDAP status: Due, Education has been provided regarding the importance of this vaccine. Advised may receive this vaccine at local pharmacy or Health Dept. Aware to provide a copy of the vaccination record if obtained from local pharmacy or Health Dept. Verbalized acceptance and understanding.  Flu Vaccine status: Declined, Education has been provided regarding the importance of this vaccine but patient still declined. Advised may receive this vaccine at local pharmacy or Health Dept. Aware to provide a copy of the vaccination record if obtained from local pharmacy or Health Dept. Verbalized acceptance and understanding.  Pneumococcal vaccine status: Due, Education has been provided regarding the importance of this vaccine. Advised may receive this vaccine at local pharmacy or Health Dept. Aware to provide a copy of the vaccination record if obtained from local pharmacy or Health Dept. Verbalized acceptance and understanding.  Covid-19 vaccine status: Completed vaccines  Qualifies for Shingles Vaccine? Yes   Zostavax completed No   Shingrix Completed?: No.    Education has been provided regarding the importance of this vaccine. Patient has been  advised to call insurance company to determine out of pocket expense if they have not yet received this vaccine. Advised may also receive vaccine at local pharmacy or Health Dept. Verbalized acceptance and understanding.  Screening Tests Health Maintenance  Topic Date Due   TETANUS/TDAP  Never done   Zoster Vaccines- Shingrix (1 of 2) Never done   PNA vac Low Risk Adult (2 of 2 -  PPSV23) 03/04/2015   COVID-19 Vaccine (2 - Janssen risk series) 09/12/2020   INFLUENZA VACCINE  06/10/2021   HPV VACCINES  Aged Out    Health Maintenance  Health Maintenance Due  Topic Date Due   TETANUS/TDAP  Never done   Zoster Vaccines- Shingrix (1 of 2) Never done   PNA vac Low Risk Adult (2 of 2 - PPSV23) 03/04/2015   COVID-19 Vaccine (2 - Janssen risk series) 09/12/2020    Colorectal cancer screening: No longer required.   Lung Cancer Screening: (Low Dose CT Chest recommended if Age 75-80 years, 30 pack-year currently smoking OR have quit w/in 15years.) does not qualify.   Additional Screening:  Hepatitis C Screening: does not qualify  Vision Screening: Recommended annual ophthalmology exams for early detection of glaucoma and other disorders of the eye. Is the patient up to date with their annual eye exam?  Yes  Who is the provider or what is the name of the office in which the patient attends annual eye exams? [facility].   Dental Screening: Recommended annual dental exams for proper oral hygiene  Community Resource Referral / Chronic Care Management: CRR required this visit?  No   CCM required this visit?  No      Plan:     I have personally reviewed and noted the following in the patient's chart:   Medical and social history Use of alcohol, tobacco or illicit drugs  Current medications and supplements including opioid prescriptions. Patient is not currently taking opioid prescriptions. Functional ability and status Nutritional status Physical activity Advanced directives List of  other physicians Hospitalizations, surgeries, and ER visits in previous 12 months Vitals Screenings to include cognitive, depression, and falls Referrals and appointments  In addition, I have reviewed and discussed with patient certain preventive protocols, quality metrics, and best practice recommendations. A written personalized care plan for preventive services as well as general preventive health recommendations were provided to patient.     Clemetine Marker, LPN   [id number]   Nurse Notes: pt advised to schedule OV and labs with new provider starting next month

## 2021-06-06 NOTE — Anesthesia Preprocedure Evaluation (Addendum)
Anesthesia Evaluation  Patient identified by MRN, date of birth, ID band Patient awake    Reviewed: Allergy & Precautions, NPO status , Patient's Chart, lab work & pertinent test results  History of Anesthesia Complications Negative for: history of anesthetic complications  Airway Mallampati: IV   Neck ROM: Full    Dental   Missing few molars:   Pulmonary COPD, former smoker (quit 1981),    Pulmonary exam normal breath sounds clear to auscultation       Cardiovascular hypertension, Normal cardiovascular exam Rhythm:Regular Rate:Normal     Neuro/Psych negative neurological ROS     GI/Hepatic negative GI ROS,   Endo/Other  negative endocrine ROS  Renal/GU negative Renal ROS     Musculoskeletal  (+) Arthritis , Osteoarthritis and Rheumatoid disorders,    Abdominal   Peds  Hematology  (+) Blood dyscrasia, anemia , Prostate CA   Anesthesia Other Findings BPH  Reproductive/Obstetrics                            Anesthesia Physical Anesthesia Plan  ASA: 2  Anesthesia Plan: MAC   Post-op Pain Management:    Induction: Intravenous  PONV Risk Score and Plan: 1 and TIVA, Midazolam and Treatment may vary due to age or medical condition  Airway Management Planned: Nasal Cannula  Additional Equipment:   Intra-op Plan:   Post-operative Plan:   Informed Consent: I have reviewed the patients History and Physical, chart, labs and discussed the procedure including the risks, benefits and alternatives for the proposed anesthesia with the patient or authorized representative who has indicated his/her understanding and acceptance.       Plan Discussed with: CRNA  Anesthesia Plan Comments:        Anesthesia Quick Evaluation

## 2021-06-11 DIAGNOSIS — Z87891 Personal history of nicotine dependence: Secondary | ICD-10-CM | POA: Diagnosis not present

## 2021-06-11 DIAGNOSIS — J479 Bronchiectasis, uncomplicated: Secondary | ICD-10-CM | POA: Diagnosis not present

## 2021-06-11 DIAGNOSIS — R942 Abnormal results of pulmonary function studies: Secondary | ICD-10-CM | POA: Diagnosis not present

## 2021-06-11 DIAGNOSIS — M059 Rheumatoid arthritis with rheumatoid factor, unspecified: Secondary | ICD-10-CM | POA: Diagnosis not present

## 2021-06-11 DIAGNOSIS — R0602 Shortness of breath: Secondary | ICD-10-CM | POA: Diagnosis not present

## 2021-06-11 DIAGNOSIS — Z79899 Other long term (current) drug therapy: Secondary | ICD-10-CM | POA: Diagnosis not present

## 2021-06-14 NOTE — Discharge Instructions (Signed)

## 2021-06-17 ENCOUNTER — Ambulatory Visit
Admission: RE | Admit: 2021-06-17 | Discharge: 2021-06-17 | Disposition: A | Discharge status: Home / Self Care | Payer: Medicare HMO | Attending: Ophthalmology | Admitting: Ophthalmology

## 2021-06-17 ENCOUNTER — Encounter
Admission: RE | Disposition: A | Discharge status: Home / Self Care | Payer: Self-pay | Source: Home / Self Care | Attending: Ophthalmology

## 2021-06-17 ENCOUNTER — Ambulatory Visit: Payer: Medicare HMO | Admitting: Anesthesiology

## 2021-06-17 ENCOUNTER — Other Ambulatory Visit: Payer: Self-pay

## 2021-06-17 ENCOUNTER — Encounter: Payer: Self-pay | Admitting: Ophthalmology

## 2021-06-17 DIAGNOSIS — H2511 Age-related nuclear cataract, right eye: Secondary | ICD-10-CM | POA: Insufficient documentation

## 2021-06-17 DIAGNOSIS — Z96643 Presence of artificial hip joint, bilateral: Secondary | ICD-10-CM | POA: Diagnosis not present

## 2021-06-17 DIAGNOSIS — Z888 Allergy status to other drugs, medicaments and biological substances status: Secondary | ICD-10-CM | POA: Diagnosis not present

## 2021-06-17 DIAGNOSIS — Z8546 Personal history of malignant neoplasm of prostate: Secondary | ICD-10-CM | POA: Diagnosis not present

## 2021-06-17 DIAGNOSIS — Z79899 Other long term (current) drug therapy: Secondary | ICD-10-CM | POA: Insufficient documentation

## 2021-06-17 DIAGNOSIS — H25811 Combined forms of age-related cataract, right eye: Secondary | ICD-10-CM | POA: Diagnosis not present

## 2021-06-17 DIAGNOSIS — Z87891 Personal history of nicotine dependence: Secondary | ICD-10-CM | POA: Insufficient documentation

## 2021-06-17 HISTORY — PX: CATARACT EXTRACTION W/PHACO: SHX586

## 2021-06-17 SURGERY — PHACOEMULSIFICATION, CATARACT, WITH IOL INSERTION
Anesthesia: Monitor Anesthesia Care | Site: Eye | Laterality: Right

## 2021-06-17 MED ORDER — SIGHTPATH DOSE#1 BSS IO SOLN
INTRAOCULAR | Status: DC | PRN
  Administered 2021-06-17: 15 mL

## 2021-06-17 MED ORDER — LACTATED RINGERS IV SOLN: INTRAVENOUS | Status: DC

## 2021-06-17 MED ORDER — ONDANSETRON HCL 4 MG/2ML IJ SOLN
4.0000 mg | Freq: Once | INTRAMUSCULAR | Status: DC | PRN
Start: 2021-06-17 — End: 2021-06-17

## 2021-06-17 MED ORDER — ACETAMINOPHEN 160 MG/5ML PO SOLN: 325.0000 mg | ORAL | Status: DC | PRN

## 2021-06-17 MED ORDER — ACETAMINOPHEN 325 MG PO TABS: 650.0000 mg | ORAL_TABLET | Freq: Once | ORAL | Status: DC | PRN

## 2021-06-17 MED ORDER — SIGHTPATH DOSE#1 SODIUM HYALURONATE 23 MG/ML IO SOLUTION
PREFILLED_SYRINGE | INTRAOCULAR | Status: DC | PRN
  Administered 2021-06-17: 0.55 mL via INTRAOCULAR

## 2021-06-17 MED ORDER — PHENYLEPHRINE HCL 10 % OP SOLN
1.0000 [drp] | OPHTHALMIC | Status: AC
  Administered 2021-06-17 (×3): 1 [drp] via OPHTHALMIC

## 2021-06-17 MED ORDER — MOXIFLOXACIN HCL 0.5 % OP SOLN
OPHTHALMIC | Status: DC | PRN
  Administered 2021-06-17: 0.2 mL via OPHTHALMIC

## 2021-06-17 MED ORDER — SIGHTPATH DOSE#1 BSS IO SOLN
INTRAOCULAR | Status: DC | PRN
  Administered 2021-06-17: 69 mL via OPHTHALMIC

## 2021-06-17 MED ORDER — FENTANYL CITRATE (PF) 100 MCG/2ML IJ SOLN
INTRAMUSCULAR | Status: DC | PRN
  Administered 2021-06-17: 50 ug via INTRAVENOUS

## 2021-06-17 MED ORDER — CYCLOPENTOLATE HCL 2 % OP SOLN
1.0000 [drp] | OPHTHALMIC | Status: AC
  Administered 2021-06-17 (×3): 1 [drp] via OPHTHALMIC

## 2021-06-17 MED ORDER — TETRACAINE HCL 0.5 % OP SOLN
1.0000 [drp] | OPHTHALMIC | Status: DC | PRN
  Administered 2021-06-17 (×3): 1 [drp] via OPHTHALMIC

## 2021-06-17 MED ORDER — MIDAZOLAM HCL 2 MG/2ML IJ SOLN
INTRAMUSCULAR | Status: DC | PRN
  Administered 2021-06-17: 1 mg via INTRAVENOUS

## 2021-06-17 MED ORDER — SIGHTPATH DOSE#1 SODIUM HYALURONATE 10 MG/ML IO SOLUTION
PREFILLED_SYRINGE | INTRAOCULAR | Status: DC | PRN
  Administered 2021-06-17: 0.85 mL via INTRAOCULAR

## 2021-06-17 MED ORDER — LIDOCAINE HCL (PF) 2 % IJ SOLN
INTRAOCULAR | Status: DC | PRN
  Administered 2021-06-17: 1 mL via INTRAOCULAR

## 2021-06-17 SURGICAL SUPPLY — 15 items
CANNULA ANT/CHMB 27GA (MISCELLANEOUS) ×4 IMPLANT
DISSECTOR HYDRO NUCLEUS 50X22 (MISCELLANEOUS) ×2 IMPLANT
GLOVE SURG ENC TEXT LTX SZ7.5 (GLOVE) IMPLANT
GLOVE SURG GAMMEX PI TX LF 7.5 (GLOVE) ×4 IMPLANT
GLOVE SURG SYN 8.5  E (GLOVE) ×1
GLOVE SURG SYN 8.5 E (GLOVE) ×1 IMPLANT
GOWN STRL REUS W/ TWL LRG LVL3 (GOWN DISPOSABLE) ×2 IMPLANT
GOWN STRL REUS W/TWL LRG LVL3 (GOWN DISPOSABLE) ×4
LENS IOL TECNIS EYHANCE 22.5 (Intraocular Lens) ×2 IMPLANT
MARKER SKIN DUAL TIP RULER LAB (MISCELLANEOUS) ×2 IMPLANT
PACK EYE AFTER SURG (MISCELLANEOUS) ×2 IMPLANT
SYR 3ML LL SCALE MARK (SYRINGE) ×2 IMPLANT
SYR TB 1ML LUER SLIP (SYRINGE) ×2 IMPLANT
WATER STERILE IRR 250ML POUR (IV SOLUTION) ×2 IMPLANT
WIPE NON LINTING 3.25X3.25 (MISCELLANEOUS) ×2 IMPLANT

## 2021-06-17 NOTE — Anesthesia Procedure Notes (Signed)
Procedure Name: MAC Date/Time: 06/17/2021 10:35 AM Performed by: Dionne Bucy, CRNA Pre-anesthesia Checklist: Patient identified, Emergency Drugs available, Suction available, Patient being monitored and Timeout performed Patient Re-evaluated:Patient Re-evaluated prior to induction Oxygen Delivery Method: Nasal cannula Placement Confirmation: positive ETCO2

## 2021-06-17 NOTE — Op Note (Signed)
OPERATIVE NOTE  [name] Foster [id number] 06/17/2021   PREOPERATIVE DIAGNOSIS:  Nuclear sclerotic cataract right eye.  H25.11   POSTOPERATIVE DIAGNOSIS:    Nuclear sclerotic cataract right eye.     PROCEDURE:  Phacoemusification with posterior chamber intraocular lens placement of the right eye   LENS:   Implant Name Type Inv. Item Serial No. Manufacturer Lot No. LRB No. Used Action  LENS IOL TECNIS EYHANCE 22.5 - [id number] Intraocular Lens LENS IOL TECNIS EYHANCE 22.5 [id number] [id number]   Right 1 Implanted       Procedure(s) with comments: CATARACT EXTRACTION PHACO AND INTRAOCULAR LENS PLACEMENT (IOC) RIGHT (Right) - 10.20 0:52.5  DIB00 +22.5   SURGEON:  Benay Pillow, MD, MPH  ANESTHESIOLOGIST: Anesthesiologist: Darrin Nipper, MD CRNA: Dionne Bucy, CRNA   ANESTHESIA:  Topical with tetracaine drops augmented with 1% preservative-free intracameral lidocaine.  ESTIMATED BLOOD LOSS: less than 1 mL.   COMPLICATIONS:  None.   DESCRIPTION OF PROCEDURE:  The patient was identified in the holding room and transported to the operating room and placed in the supine position under the operating microscope.  The right eye was identified as the operative eye and it was prepped and draped in the usual sterile ophthalmic fashion.   A 1.0 millimeter clear-corneal paracentesis was made at the 10:30 position. 0.5 ml of preservative-free 1% lidocaine with epinephrine was injected into the anterior chamber.  The anterior chamber was filled with Healon 5 viscoelastic.  A 2.4 millimeter keratome was used to make a near-clear corneal incision at the 8:00 position.  A curvilinear capsulorrhexis was made with a cystotome and capsulorrhexis forceps.  Balanced salt solution was used to hydrodissect and hydrodelineate the nucleus.   Phacoemulsification was then used in stop and chop fashion to remove the lens nucleus and epinucleus.  The remaining cortex was then removed using the irrigation and  aspiration handpiece. Healon was then placed into the capsular bag to distend it for lens placement.  A lens was then injected into the capsular bag.  The remaining viscoelastic was aspirated.   Wounds were hydrated with balanced salt solution.  The anterior chamber was inflated to a physiologic pressure with balanced salt solution.   Intracameral vigamox 0.1 mL undiluted was injected into the eye and a drop placed onto the ocular surface.  No wound leaks were noted.  The patient was taken to the recovery room in stable condition without complications of anesthesia or surgery  Benay Pillow 06/17/2021, 10:54 AM

## 2021-06-17 NOTE — H&P (Signed)
[facility]   Primary Care Physician:  [facility], [place] Ophthalmologist: Dr. Benay Pillow  Pre-Procedure History & Physical: HPI:  [name] Foster is a 80 y.o. male here for cataract surgery.   Past Medical History:  Diagnosis Date   Arthritis    BPH (benign prostatic hyperplasia)    Former smoker, stopped smoking many years ago    History of needle biopsy of prostate with negative result 2013   Hypertension    Prostate enlargement    Prostatic intraepithelial neoplasm III 2013    Past Surgical History:  Procedure Laterality Date   ESOPHAGOGASTRODUODENOSCOPY N/A 06/26/2019   Procedure: ESOPHAGOGASTRODUODENOSCOPY (EGD);  Surgeon: Lin Landsman, MD;  Location: [facility]COPY;  Service: Gastroenterology;  Laterality: N/A;   HEMORROIDECTOMY     HERNIA REPAIR     JOINT REPLACEMENT Left    THR   PROSTATE BIOPSY  2013   TOTAL HIP ARTHROPLASTY Left    TOTAL HIP ARTHROPLASTY Right 04/26/2019   Procedure: TOTAL HIP ARTHROPLASTY ANTERIOR APPROACH;  Surgeon: Hessie Knows, MD;  Location: [facility] ORS;  Service: Orthopedics;  Laterality: Right;    Prior to Admission medications   Medication Sig Start Date End Date Taking? Authorizing Provider  HUMIRA PEN-PSOR/UVEIT STARTER 80 MG/0.8ML & '40MG'$ /0.4ML PNKT  05/24/20  Yes [provider]  Multiple Vitamin (MULTIVITAMIN WITH MINERALS) TABS tablet Take 1 tablet by mouth daily. 06/30/19  Yes Nicholes Mango, MD    Allergies as of 04/25/2021 - Review Complete 09/13/2020  Allergen Reaction Noted   Methotrexate  06/02/2019    Family History  Problem Relation Age of Onset   Prostate cancer Father    Cancer Father    Prostate cancer Brother    Cancer Mother     Social History   Socioeconomic History   Marital status: Married    Spouse name: Not on file   Number of children: 4   Years of education: Not on file   Highest education level: Not on file  Occupational History   Not on file  Tobacco Use    Smoking status: Former    Types: Cigarettes    Quit date: 06/04/1980    Years since quitting: 41.0   Smokeless tobacco: Never  Vaping Use   Vaping Use: Never used  Substance and Sexual Activity   Alcohol use: No   Drug use: No   Sexual activity: Yes  Other Topics Concern   Not on file  Social History Narrative   Not on file   Social Determinants of Health   Financial Resource Strain: Low Risk    Difficulty of Paying Living Expenses: Not hard at all  Food Insecurity: No Food Insecurity   Worried About Charity fundraiser in the Last Year: Never true   [place] in the Last Year: Never true  Transportation Needs: No Transportation Needs   Lack of Transportation (Medical): No   Lack of Transportation (Non-Medical): No  Physical Activity: Sufficiently Active   Days of Exercise per Week: 7 days   Minutes of Exercise per Session: 30 min  Stress: No Stress Concern Present   Feeling of Stress : Not at all  Social Connections: Moderately Integrated   Frequency of Communication with Friends and Family: More than three times a week   Frequency of Social Gatherings with Friends and Family: Three times a week   Attends Religious Services: More than 4 times per year   Active Member of Clubs or Organizations: No  Attends Archivist Meetings: Never   Marital Status: Married  Human resources officer Violence: Not At Risk   Fear of Current or Ex-Partner: No   Emotionally Abused: No   Physically Abused: No   Sexually Abused: No    Review of Systems: See HPI, otherwise negative ROS  Physical Exam: BP (!) 156/86   Pulse 76   Temp 97.8 F (36.6 C) (Temporal)   Resp 18   Ht '5\' 8"'$  (1.727 m)   Wt 86.2 kg   SpO2 98%   BMI 28.89 kg/m  General:   Alert, cooperative in NAD Head:  Normocephalic and atraumatic. Respiratory:  Normal work of breathing. Cardiovascular:  RRR  Impression/Plan: JOVAUGHN KERINS is here for cataract surgery.  Risks, benefits, limitations, and  alternatives regarding cataract surgery have been reviewed with the patient.  Questions have been answered.  All parties agreeable.   Benay Pillow, MD  06/17/2021, 10:23 AM

## 2021-06-17 NOTE — Transfer of Care (Signed)
Immediate Anesthesia Transfer of Care Note  Patient: [name] Foster  Procedure(s) Performed: CATARACT EXTRACTION PHACO AND INTRAOCULAR LENS PLACEMENT (IOC) RIGHT (Right: Eye)  Patient Location: PACU  Anesthesia Type: MAC  Level of Consciousness: awake, alert  and patient cooperative  Airway and Oxygen Therapy: Patient Spontanous Breathing and Patient connected to supplemental oxygen  Post-op Assessment: Post-op Vital signs reviewed, Patient's Cardiovascular Status Stable, Respiratory Function Stable, Patent Airway and No signs of Nausea or vomiting  Post-op Vital Signs: Reviewed and stable  Complications: No notable events documented.

## 2021-06-17 NOTE — Anesthesia Postprocedure Evaluation (Signed)
Anesthesia Post Note  Patient: [name] Foster  Procedure(s) Performed: CATARACT EXTRACTION PHACO AND INTRAOCULAR LENS PLACEMENT (IOC) RIGHT (Right: Eye)     Patient location during evaluation: PACU Anesthesia Type: MAC Level of consciousness: awake and alert, oriented and patient cooperative Pain management: pain level controlled Vital Signs Assessment: post-procedure vital signs reviewed and stable Respiratory status: spontaneous breathing, nonlabored ventilation and respiratory function stable Cardiovascular status: blood pressure returned to baseline and stable Postop Assessment: adequate PO intake Anesthetic complications: no   No notable events documented.  Darrin Nipper

## 2021-06-18 ENCOUNTER — Encounter: Payer: Self-pay | Admitting: Ophthalmology

## 2021-07-04 DIAGNOSIS — Z79899 Other long term (current) drug therapy: Secondary | ICD-10-CM | POA: Diagnosis not present

## 2021-07-04 DIAGNOSIS — R7982 Elevated C-reactive protein (CRP): Secondary | ICD-10-CM | POA: Diagnosis not present

## 2021-07-04 DIAGNOSIS — H15033 Posterior scleritis, bilateral: Secondary | ICD-10-CM | POA: Diagnosis not present

## 2021-07-04 DIAGNOSIS — H35052 Retinal neovascularization, unspecified, left eye: Secondary | ICD-10-CM | POA: Diagnosis not present

## 2021-07-04 DIAGNOSIS — H35363 Drusen (degenerative) of macula, bilateral: Secondary | ICD-10-CM | POA: Diagnosis not present

## 2021-07-04 DIAGNOSIS — H3562 Retinal hemorrhage, left eye: Secondary | ICD-10-CM | POA: Diagnosis not present

## 2021-07-04 DIAGNOSIS — M0579 Rheumatoid arthritis with rheumatoid factor of multiple sites without organ or systems involvement: Secondary | ICD-10-CM | POA: Diagnosis not present

## 2021-07-04 DIAGNOSIS — M059 Rheumatoid arthritis with rheumatoid factor, unspecified: Secondary | ICD-10-CM | POA: Diagnosis not present

## 2021-07-04 DIAGNOSIS — H43813 Vitreous degeneration, bilateral: Secondary | ICD-10-CM | POA: Diagnosis not present

## 2021-07-23 ENCOUNTER — Encounter: Payer: Self-pay | Admitting: Ophthalmology

## 2021-07-23 DIAGNOSIS — H2512 Age-related nuclear cataract, left eye: Secondary | ICD-10-CM | POA: Diagnosis not present

## 2021-08-01 NOTE — Discharge Instructions (Signed)

## 2021-08-05 ENCOUNTER — Ambulatory Visit
Admission: RE | Admit: 2021-08-05 | Discharge: 2021-08-05 | Disposition: A | Discharge status: Home / Self Care | Payer: Medicare HMO | Attending: Ophthalmology | Admitting: Ophthalmology

## 2021-08-05 ENCOUNTER — Ambulatory Visit: Payer: Medicare HMO | Admitting: Anesthesiology

## 2021-08-05 ENCOUNTER — Other Ambulatory Visit: Payer: Self-pay

## 2021-08-05 ENCOUNTER — Encounter
Admission: RE | Disposition: A | Discharge status: Home / Self Care | Payer: Self-pay | Source: Home / Self Care | Attending: Ophthalmology

## 2021-08-05 DIAGNOSIS — Z888 Allergy status to other drugs, medicaments and biological substances status: Secondary | ICD-10-CM | POA: Insufficient documentation

## 2021-08-05 DIAGNOSIS — Z9841 Cataract extraction status, right eye: Secondary | ICD-10-CM | POA: Insufficient documentation

## 2021-08-05 DIAGNOSIS — Z87891 Personal history of nicotine dependence: Secondary | ICD-10-CM | POA: Diagnosis not present

## 2021-08-05 DIAGNOSIS — H25812 Combined forms of age-related cataract, left eye: Secondary | ICD-10-CM | POA: Diagnosis not present

## 2021-08-05 DIAGNOSIS — H2512 Age-related nuclear cataract, left eye: Secondary | ICD-10-CM | POA: Diagnosis not present

## 2021-08-05 DIAGNOSIS — Z961 Presence of intraocular lens: Secondary | ICD-10-CM | POA: Diagnosis not present

## 2021-08-05 DIAGNOSIS — Z794 Long term (current) use of insulin: Secondary | ICD-10-CM | POA: Insufficient documentation

## 2021-08-05 HISTORY — PX: CATARACT EXTRACTION W/PHACO: SHX586

## 2021-08-05 SURGERY — PHACOEMULSIFICATION, CATARACT, WITH IOL INSERTION
Anesthesia: Monitor Anesthesia Care | Site: Eye | Laterality: Left

## 2021-08-05 MED ORDER — MOXIFLOXACIN HCL 0.5 % OP SOLN
OPHTHALMIC | Status: DC | PRN
  Administered 2021-08-05: 0.2 mL via OPHTHALMIC

## 2021-08-05 MED ORDER — SIGHTPATH DOSE#1 BSS IO SOLN
INTRAOCULAR | Status: DC | PRN
  Administered 2021-08-05: 62 mL via OPHTHALMIC

## 2021-08-05 MED ORDER — TETRACAINE HCL 0.5 % OP SOLN
1.0000 [drp] | OPHTHALMIC | Status: DC | PRN
  Administered 2021-08-05 (×3): 1 [drp] via OPHTHALMIC

## 2021-08-05 MED ORDER — SIGHTPATH DOSE#1 SODIUM HYALURONATE 10 MG/ML IO SOLUTION
PREFILLED_SYRINGE | INTRAOCULAR | Status: DC | PRN
  Administered 2021-08-05: 0.85 mL via INTRAOCULAR

## 2021-08-05 MED ORDER — SIGHTPATH DOSE#1 BSS IO SOLN
INTRAOCULAR | Status: DC | PRN
  Administered 2021-08-05: 15 mL

## 2021-08-05 MED ORDER — LIDOCAINE HCL (PF) 2 % IJ SOLN
INTRAOCULAR | Status: DC | PRN
  Administered 2021-08-05: 2 mL via INTRAOCULAR

## 2021-08-05 MED ORDER — LACTATED RINGERS IV SOLN: INTRAVENOUS | Status: DC

## 2021-08-05 MED ORDER — ARMC OPHTHALMIC DILATING DROPS
1.0000 "application " | OPHTHALMIC | Status: DC | PRN
  Administered 2021-08-05 (×3): 1 via OPHTHALMIC

## 2021-08-05 MED ORDER — MIDAZOLAM HCL 2 MG/2ML IJ SOLN
INTRAMUSCULAR | Status: DC | PRN
  Administered 2021-08-05 (×2): 1 mg via INTRAVENOUS

## 2021-08-05 MED ORDER — FENTANYL CITRATE (PF) 100 MCG/2ML IJ SOLN
INTRAMUSCULAR | Status: DC | PRN
  Administered 2021-08-05: 50 ug via INTRAVENOUS

## 2021-08-05 MED ORDER — SIGHTPATH DOSE#1 SODIUM HYALURONATE 23 MG/ML IO SOLUTION
PREFILLED_SYRINGE | INTRAOCULAR | Status: DC | PRN
  Administered 2021-08-05: 0.55 mL via INTRAOCULAR

## 2021-08-05 SURGICAL SUPPLY — 12 items
DISSECTOR HYDRO NUCLEUS 50X22 (MISCELLANEOUS) ×2 IMPLANT
GLOVE SURG GAMMEX PI TX LF 7.5 (GLOVE) ×2 IMPLANT
GLOVE SURG SYN 8.5  E (GLOVE) ×1
GLOVE SURG SYN 8.5 E (GLOVE) ×1 IMPLANT
GOWN STRL REUS W/ TWL LRG LVL3 (GOWN DISPOSABLE) ×2 IMPLANT
GOWN STRL REUS W/TWL LRG LVL3 (GOWN DISPOSABLE) ×4
LENS IOL TECNIS EYHANCE 23.5 (Intraocular Lens) ×2 IMPLANT
PACK EYE AFTER SURG (MISCELLANEOUS) ×2 IMPLANT
SYR 3ML LL SCALE MARK (SYRINGE) ×2 IMPLANT
SYR TB 1ML LUER SLIP (SYRINGE) ×2 IMPLANT
WATER STERILE IRR 250ML POUR (IV SOLUTION) ×2 IMPLANT
WIPE NON LINTING 3.25X3.25 (MISCELLANEOUS) ×2 IMPLANT

## 2021-08-05 NOTE — Anesthesia Postprocedure Evaluation (Signed)
Anesthesia Post Note  Patient: [name] Foster  Procedure(s) Performed: CATARACT EXTRACTION PHACO AND INTRAOCULAR LENS PLACEMENT (IOC) LEFT (Left: Eye)     Patient location during evaluation: PACU Anesthesia Type: MAC Level of consciousness: awake and alert and oriented Pain management: satisfactory to patient Vital Signs Assessment: post-procedure vital signs reviewed and stable Respiratory status: spontaneous breathing, nonlabored ventilation and respiratory function stable Cardiovascular status: blood pressure returned to baseline and stable Postop Assessment: Adequate PO intake and No signs of nausea or vomiting Anesthetic complications: no   No notable events documented.  Raliegh Ip

## 2021-08-05 NOTE — Anesthesia Preprocedure Evaluation (Signed)
Anesthesia Evaluation  Patient identified by MRN, date of birth, ID band Patient awake    Reviewed: Allergy & Precautions, H&P , NPO status , Patient's Chart, lab work & pertinent test results  History of Anesthesia Complications Negative for: history of anesthetic complications  Airway Mallampati: II  TM Distance: >3 FB Neck ROM: full    Dental no notable dental hx.  Missing few molars:   Pulmonary COPD, former smoker,    Pulmonary exam normal breath sounds clear to auscultation       Cardiovascular hypertension, Normal cardiovascular exam Rhythm:regular Rate:Normal     Neuro/Psych negative neurological ROS     GI/Hepatic negative GI ROS,   Endo/Other  negative endocrine ROS  Renal/GU negative Renal ROS     Musculoskeletal  (+) Arthritis , Osteoarthritis and Rheumatoid disorders,    Abdominal   Peds  Hematology  (+) Blood dyscrasia, anemia , Prostate CA   Anesthesia Other Findings BPH  Reproductive/Obstetrics                             Anesthesia Physical  Anesthesia Plan  ASA: 2  Anesthesia Plan: MAC   Post-op Pain Management:    Induction: Intravenous  PONV Risk Score and Plan: 1 and Treatment may vary due to age or medical condition, TIVA and Midazolam  Airway Management Planned: Nasal Cannula  Additional Equipment:   Intra-op Plan:   Post-operative Plan:   Informed Consent: I have reviewed the patients History and Physical, chart, labs and discussed the procedure including the risks, benefits and alternatives for the proposed anesthesia with the patient or authorized representative who has indicated his/her understanding and acceptance.     Dental Advisory Given  Plan Discussed with: CRNA  Anesthesia Plan Comments:         Anesthesia Quick Evaluation

## 2021-08-05 NOTE — H&P (Signed)
[facility]   Primary Care Physician:  [facility], [place] Ophthalmologist: Dr. Benay Pillow  Pre-Procedure History & Physical: HPI:  [name] Foster is a 80 y.o. male here for cataract surgery.   Past Medical History:  Diagnosis Date   Arthritis    BPH (benign prostatic hyperplasia)    Former smoker, stopped smoking many years ago    History of needle biopsy of prostate with negative result 2013   Hypertension    Prostate enlargement    Prostatic intraepithelial neoplasm III 2013    Past Surgical History:  Procedure Laterality Date   CATARACT EXTRACTION W/PHACO Right 06/17/2021   Procedure: CATARACT EXTRACTION PHACO AND INTRAOCULAR LENS PLACEMENT (IOC) RIGHT;  Surgeon: Eulogio Bear, MD;  Location: [place];  Service: Ophthalmology;  Laterality: Right;  10.20 0:52.5   ESOPHAGOGASTRODUODENOSCOPY N/A 06/26/2019   Procedure: ESOPHAGOGASTRODUODENOSCOPY (EGD);  Surgeon: Lin Landsman, MD;  Location: [facility] ENDOSCOPY;  Service: Gastroenterology;  Laterality: N/A;   HEMORROIDECTOMY     HERNIA REPAIR     JOINT REPLACEMENT Left    THR   PROSTATE BIOPSY  2013   TOTAL HIP ARTHROPLASTY Left    TOTAL HIP ARTHROPLASTY Right 04/26/2019   Procedure: TOTAL HIP ARTHROPLASTY ANTERIOR APPROACH;  Surgeon: Hessie Knows, MD;  Location: [facility] ORS;  Service: Orthopedics;  Laterality: Right;    Prior to Admission medications   Medication Sig Start Date End Date Taking? Authorizing Provider  HUMIRA PEN-PSOR/UVEIT STARTER 80 MG/0.8ML & 40MG /0.4ML PNKT  05/24/20  Yes [provider]  Multiple Vitamin (MULTIVITAMIN WITH MINERALS) TABS tablet Take 1 tablet by mouth daily. 06/30/19  Yes Nicholes Mango, MD    Allergies as of 07/03/2021 - Review Complete 06/17/2021  Allergen Reaction Noted   Methotrexate  06/02/2019    Family History  Problem Relation Age of Onset   Prostate cancer Father    Cancer Father    Prostate cancer Brother    Cancer Mother      Social History   Socioeconomic History   Marital status: Married    Spouse name: Not on file   Number of children: 4   Years of education: Not on file   Highest education level: Not on file  Occupational History   Not on file  Tobacco Use   Smoking status: Former    Types: Cigarettes    Quit date: 06/04/1980    Years since quitting: 41.1   Smokeless tobacco: Never  Vaping Use   Vaping Use: Never used  Substance and Sexual Activity   Alcohol use: No   Drug use: No   Sexual activity: Yes  Other Topics Concern   Not on file  Social History Narrative   Not on file   Social Determinants of Health   Financial Resource Strain: Low Risk    Difficulty of Paying Living Expenses: Not hard at all  Food Insecurity: No Food Insecurity   Worried About Charity fundraiser in the Last Year: Never true   [place] in the Last Year: Never true  Transportation Needs: No Transportation Needs   Lack of Transportation (Medical): No   Lack of Transportation (Non-Medical): No  Physical Activity: Sufficiently Active   Days of Exercise per Week: 7 days   Minutes of Exercise per Session: 30 min  Stress: No Stress Concern Present   Feeling of Stress : Not at all  Social Connections: Moderately Integrated   Frequency of Communication with Friends and Family: More than  three times a week   Frequency of Social Gatherings with Friends and Family: Three times a week   Attends Religious Services: More than 4 times per year   Active Member of Clubs or Organizations: No   Attends Archivist Meetings: Never   Marital Status: Married  Human resources officer Violence: Not At Risk   Fear of Current or Ex-Partner: No   Emotionally Abused: No   Physically Abused: No   Sexually Abused: No    Review of Systems: See HPI, otherwise negative ROS  Physical Exam: BP 137/77   Pulse 72   Temp (!) 97.2 F (36.2 C) (Temporal)   Resp 18   Ht 5\' 8"  (1.727 m)   Wt 86.9 kg   SpO2 97%   BMI  29.13 kg/m  General:   Alert, cooperative in NAD Head:  Normocephalic and atraumatic. Respiratory:  Normal work of breathing. Cardiovascular:  RRR  Impression/Plan: [name] Foster is here for cataract surgery.  Risks, benefits, limitations, and alternatives regarding cataract surgery have been reviewed with the patient.  Questions have been answered.  All parties agreeable.   Benay Pillow, MD  08/05/2021, 7:58 AM

## 2021-08-05 NOTE — Op Note (Signed)
OPERATIVE NOTE  [name] Foster [id number] 08/05/2021   PREOPERATIVE DIAGNOSIS:  Nuclear sclerotic cataract left eye.  H25.12   POSTOPERATIVE DIAGNOSIS:    Nuclear sclerotic cataract left eye.     PROCEDURE:  Phacoemusification with posterior chamber intraocular lens placement of the left eye   LENS:   Implant Name Type Inv. Item Serial No. Manufacturer Lot No. LRB No. Used Action  LENS IOL TECNIS EYHANCE 23.5 - [id number] Intraocular Lens LENS IOL TECNIS EYHANCE 23.5 [id number] JOHNSON   Left 1 Implanted      Procedure(s) with comments: CATARACT EXTRACTION PHACO AND INTRAOCULAR LENS PLACEMENT (IOC) LEFT (Left) - 4.21 00:31.2  DIB00 +23.5   ULTRASOUND TIME: 0 minutes 31 seconds.  CDE 4.21   SURGEON:  Benay Pillow, MD, MPH   ANESTHESIA:  Topical with tetracaine drops augmented with 1% preservative-free intracameral lidocaine.  ESTIMATED BLOOD LOSS: <1 mL   COMPLICATIONS:  None.   DESCRIPTION OF PROCEDURE:  The patient was identified in the holding room and transported to the operating room and placed in the supine position under the operating microscope.  The left eye was identified as the operative eye and it was prepped and draped in the usual sterile ophthalmic fashion.   A 1.0 millimeter clear-corneal paracentesis was made at the 5:00 position. 0.5 ml of preservative-free 1% lidocaine with epinephrine was injected into the anterior chamber.  The anterior chamber was filled with Healon 5 viscoelastic.  A 2.4 millimeter keratome was used to make a near-clear corneal incision at the 2:00 position.  A curvilinear capsulorrhexis was made with a cystotome and capsulorrhexis forceps.  Balanced salt solution was used to hydrodissect and hydrodelineate the nucleus.   Phacoemulsification was then used in stop and chop fashion to remove the lens nucleus and epinucleus.  The remaining cortex was then removed using the irrigation and aspiration handpiece. Healon was then placed into the  capsular bag to distend it for lens placement.  A lens was then injected into the capsular bag.  The remaining viscoelastic was aspirated.   Wounds were hydrated with balanced salt solution.  The anterior chamber was inflated to a physiologic pressure with balanced salt solution.  Intracameral vigamox 0.1 mL undiltued was injected into the eye and a drop placed onto the ocular surface.  No wound leaks were noted.  The patient was taken to the recovery room in stable condition without complications of anesthesia or surgery  Benay Pillow 08/05/2021, 8:26 AM

## 2021-08-05 NOTE — Anesthesia Procedure Notes (Signed)
Procedure Name: MAC Date/Time: 08/05/2021 8:11 AM Performed by: Cameron Ali, CRNA Pre-anesthesia Checklist: Patient identified, Emergency Drugs available, Suction available, Timeout performed and Patient being monitored Patient Re-evaluated:Patient Re-evaluated prior to induction Oxygen Delivery Method: Nasal cannula Placement Confirmation: positive ETCO2

## 2021-08-05 NOTE — Transfer of Care (Signed)
Immediate Anesthesia Transfer of Care Note  Patient: [name] Foster  Procedure(s) Performed: CATARACT EXTRACTION PHACO AND INTRAOCULAR LENS PLACEMENT (IOC) LEFT (Left: Eye)  Patient Location: PACU  Anesthesia Type: MAC  Level of Consciousness: awake, alert  and patient cooperative  Airway and Oxygen Therapy: Patient Spontanous Breathing and Patient connected to supplemental oxygen  Post-op Assessment: Post-op Vital signs reviewed, Patient's Cardiovascular Status Stable, Respiratory Function Stable, Patent Airway and No signs of Nausea or vomiting  Post-op Vital Signs: Reviewed and stable  Complications: No notable events documented.

## 2021-08-06 ENCOUNTER — Encounter: Payer: Self-pay | Admitting: Ophthalmology

## 2021-09-05 NOTE — Progress Notes (Signed)
Established Patient Office Visit  Subjective:  Patient ID: [name] Foster, male    DOB: [birth year]  Age: 80 y.o. MRN: [id number]  CC:  Chief Complaint  Patient presents with   Follow-up    HPI [name] Foster presents for follow up on chronic medical conditions which include RA on Humira, emphysema, history of elevated PSA and MDD. Currently his only medication is Humira and he is following with Rheumatology and Ophthalmology. Next seeing Rheumatologist next week, every 3 months. He states he is doing well on the medications and reports no side effects. He recently had cataract surgery about a month ago and is doing really well with that.  He states he no longer needs glasses to see.  He does have a history of elevated PSA.  His PSA in April, 2021 was 2.7. It was checked again in July 2021 and it had increased to 4.03. He does have some mild BPH symptoms. He had seen Urology in the past for this. He does have a family history of prostate cancer in his father and brother.   We did discuss flu vaccine, Prevnar 20, Shingles and Covid vaccines, all of which he politely declined. Otherwise he states he is doing well and has no other questions or concerns today.   Past Medical History:  Diagnosis Date   Arthritis    BPH (benign prostatic hyperplasia)    Former smoker, stopped smoking many years ago    History of needle biopsy of prostate with negative result 2013   Hypertension    Prostate enlargement    Prostatic intraepithelial neoplasm III 2013    Past Surgical History:  Procedure Laterality Date   CATARACT EXTRACTION W/PHACO Right 06/17/2021   Procedure: CATARACT EXTRACTION PHACO AND INTRAOCULAR LENS PLACEMENT (IOC) RIGHT;  Surgeon: Eulogio Bear, MD;  Location: [place];  Service: Ophthalmology;  Laterality: Right;  10.20 0:52.5   CATARACT EXTRACTION W/PHACO Left 08/05/2021   Procedure: CATARACT EXTRACTION PHACO AND INTRAOCULAR LENS PLACEMENT (IOC) LEFT;   Surgeon: Eulogio Bear, MD;  Location: [place];  Service: Ophthalmology;  Laterality: Left;  4.21 00:31.2   ESOPHAGOGASTRODUODENOSCOPY N/A 06/26/2019   Procedure: ESOPHAGOGASTRODUODENOSCOPY (EGD);  Surgeon: Lin Landsman, MD;  Location: [facility] ENDOSCOPY;  Service: Gastroenterology;  Laterality: N/A;   HEMORROIDECTOMY     HERNIA REPAIR     JOINT REPLACEMENT Left    THR   PROSTATE BIOPSY  2013   TOTAL HIP ARTHROPLASTY Left    TOTAL HIP ARTHROPLASTY Right 04/26/2019   Procedure: TOTAL HIP ARTHROPLASTY ANTERIOR APPROACH;  Surgeon: Hessie Knows, MD;  Location: [facility] ORS;  Service: Orthopedics;  Laterality: Right;    Family History  Problem Relation Age of Onset   Prostate cancer Father    Cancer Father    Prostate cancer Brother    Cancer Mother     Social History   Socioeconomic History   Marital status: Married    Spouse name: Not on file   Number of children: 4   Years of education: Not on file   Highest education level: Not on file  Occupational History   Not on file  Tobacco Use   Smoking status: Former    Types: Cigarettes    Quit date: 06/04/1980    Years since quitting: 41.2   Smokeless tobacco: Never  Vaping Use   Vaping Use: Never used  Substance and Sexual Activity   Alcohol use: No   Drug use: No   Sexual activity: Yes  Other Topics Concern   Not on file  Social History Narrative   Not on file   Social Determinants of Health   Financial Resource Strain: Low Risk    Difficulty of Paying Living Expenses: Not hard at all  Food Insecurity: No Food Insecurity   Worried About Charity fundraiser in the Last Year: Never true   [place] in the Last Year: Never true  Transportation Needs: No Transportation Needs   Lack of Transportation (Medical): No   Lack of Transportation (Non-Medical): No  Physical Activity: Sufficiently Active   Days of Exercise per Week: 7 days   Minutes of Exercise per Session: 30 min  Stress: No Stress  Concern Present   Feeling of Stress : Not at all  Social Connections: Moderately Integrated   Frequency of Communication with Friends and Family: More than three times a week   Frequency of Social Gatherings with Friends and Family: Three times a week   Attends Religious Services: More than 4 times per year   Active Member of Clubs or Organizations: No   Attends Archivist Meetings: Never   Marital Status: Married  Human resources officer Violence: Not At Risk   Fear of Current or Ex-Partner: No   Emotionally Abused: No   Physically Abused: No   Sexually Abused: No    Outpatient Medications Prior to Visit  Medication Sig Dispense Refill   HUMIRA PEN-PSOR/UVEIT STARTER 80 MG/0.8ML & 40MG /0.4ML PNKT      Multiple Vitamin (MULTIVITAMIN WITH MINERALS) TABS tablet Take 1 tablet by mouth daily.     No facility-administered medications prior to visit.    Allergies  Allergen Reactions   Methotrexate     Other reaction(s): Weakness,     ROS Review of Systems  Constitutional:  Negative for activity change, appetite change, chills and fever.  Eyes:  Negative for visual disturbance.  Respiratory:  Negative for cough and shortness of breath.   Cardiovascular:  Negative for chest pain, palpitations and leg swelling.  Gastrointestinal:  Negative for abdominal pain, blood in stool, nausea and vomiting.  Genitourinary:  Positive for frequency. Negative for dysuria.  Neurological:  Negative for dizziness and weakness.     Objective:    Physical Exam Constitutional:      Appearance: Normal appearance.  HENT:     Head: Normocephalic and atraumatic.     Mouth/Throat:     Mouth: Mucous membranes are moist.     Pharynx: Oropharynx is clear.  Eyes:     Conjunctiva/sclera: Conjunctivae normal.  Cardiovascular:     Rate and Rhythm: Normal rate and regular rhythm.  Pulmonary:     Effort: Pulmonary effort is normal.     Breath sounds: Normal breath sounds.  Abdominal:     General:  There is no distension.     Palpations: Abdomen is soft.     Tenderness: There is no abdominal tenderness.  Musculoskeletal:     Right lower leg: No edema.     Left lower leg: No edema.  Skin:    General: Skin is warm and dry.  Neurological:     General: No focal deficit present.     Mental Status: He is alert. Mental status is at baseline.  Psychiatric:        Mood and Affect: Mood normal.        Behavior: Behavior normal.    BP 136/84   Pulse 85   Temp 98.5 F (36.9 C)   Resp  16   Ht 5\' 8"  (1.727 m)   Wt 194 lb (88 kg)   SpO2 97%   BMI 29.50 kg/m  Wt Readings from Last 3 Encounters:  08/05/21 191 lb 9.6 oz (86.9 kg)  06/17/21 190 lb (86.2 kg)  09/13/20 189 lb 11.2 oz (86 kg)     Health Maintenance Due  Topic Date Due   TETANUS/TDAP  Never done   Zoster Vaccines- Shingrix (1 of 2) Never done   Pneumonia Vaccine 52+ Years old (2 - PPSV23 if available, else PCV20) 03/04/2015   COVID-19 Vaccine (2 - Janssen risk series) 09/12/2020   INFLUENZA VACCINE  06/10/2021    There are no preventive care reminders to display for this patient.  Lab Results  Component Value Date   TSH 4.289 05/26/2019   Lab Results  Component Value Date   WBC 4.4 02/28/2020   HGB 9.3 (L) 02/28/2020   HCT 29.4 (L) 02/28/2020   MCV 79.9 (L) 02/28/2020   PLT 417 (H) 02/28/2020   Lab Results  Component Value Date   NA 137 02/28/2020   K 4.1 02/28/2020   CO2 30 02/28/2020   GLUCOSE 126 (H) 02/28/2020   BUN 14 02/28/2020   CREATININE 0.67 (L) 02/28/2020   BILITOT 0.2 02/28/2020   ALKPHOS 126 06/24/2019   AST 17 02/28/2020   ALT 11 02/28/2020   PROT 8.0 02/28/2020   ALBUMIN 1.7 (L) 06/24/2019   CALCIUM 8.7 02/28/2020   ANIONGAP 14 09/17/2019   No results found for: CHOL No results found for: HDL No results found for: LDLCALC No results found for: TRIG No results found for: CHOLHDL No results found for: HGBA1C    Assessment & Plan:   1. Screening for prostate  cancer/Elevated prostate specific antigen (PSA): We reviewed his last PSA results and discussed at length the pros versus cons of rechecking PSA today.  The patient would like to recheck it today, as it did double in between the last 2 values.  We discussed what these results would mean in terms of potential treatment.  The patient understands and is agreeable.  If the PSA is the same or increased he will be referred to urology for further discussion.   - PSA  2. 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  Is doing well with Humira.  He will continue to follow with rheumatology and ophthalmology.   Follow-up: Return in about 6 months (around 03/07/2022).    Teodora Medici, DO

## 2021-09-06 ENCOUNTER — Other Ambulatory Visit: Payer: Self-pay

## 2021-09-06 ENCOUNTER — Ambulatory Visit (INDEPENDENT_AMBULATORY_CARE_PROVIDER_SITE_OTHER): Payer: Medicare HMO | Admitting: Internal Medicine

## 2021-09-06 ENCOUNTER — Encounter: Payer: Self-pay | Admitting: Internal Medicine

## 2021-09-06 VITALS — BP 136/84 | HR 85 | Temp 98.5°F | Resp 16 | Ht 68.0 in | Wt 194.0 lb

## 2021-09-06 DIAGNOSIS — R972 Elevated prostate specific antigen [PSA]: Secondary | ICD-10-CM | POA: Diagnosis not present

## 2021-09-06 DIAGNOSIS — M059 Rheumatoid arthritis with rheumatoid factor, unspecified: Secondary | ICD-10-CM | POA: Diagnosis not present

## 2021-09-06 DIAGNOSIS — Z125 Encounter for screening for malignant neoplasm of prostate: Secondary | ICD-10-CM

## 2021-09-06 DIAGNOSIS — H15003 Unspecified scleritis, bilateral: Secondary | ICD-10-CM | POA: Diagnosis not present

## 2021-09-06 LAB — PSA: PSA: 4.33 ng/mL — ABNORMAL HIGH (ref <=4.00)

## 2021-09-06 NOTE — Patient Instructions (Addendum)
It was great seeing you today!  Plan discussed at today's visit: -Blood work ordered today, results will be uploaded to [facility].  -Continue to follow with both Rheumatology and Ophthalmology  Follow up in: 6 months  Take care and let us know if you have any questions or concerns prior to your next visit.  Dr. Rosana Berger

## 2021-09-19 DIAGNOSIS — H35363 Drusen (degenerative) of macula, bilateral: Secondary | ICD-10-CM | POA: Diagnosis not present

## 2021-09-19 DIAGNOSIS — D708 Other neutropenia: Secondary | ICD-10-CM | POA: Diagnosis not present

## 2021-09-19 DIAGNOSIS — M059 Rheumatoid arthritis with rheumatoid factor, unspecified: Secondary | ICD-10-CM | POA: Diagnosis not present

## 2021-09-19 DIAGNOSIS — H43813 Vitreous degeneration, bilateral: Secondary | ICD-10-CM | POA: Diagnosis not present

## 2021-09-19 DIAGNOSIS — Z79899 Other long term (current) drug therapy: Secondary | ICD-10-CM | POA: Diagnosis not present

## 2021-09-19 DIAGNOSIS — M0579 Rheumatoid arthritis with rheumatoid factor of multiple sites without organ or systems involvement: Secondary | ICD-10-CM | POA: Diagnosis not present

## 2021-09-19 DIAGNOSIS — R7982 Elevated C-reactive protein (CRP): Secondary | ICD-10-CM | POA: Diagnosis not present

## 2021-09-19 DIAGNOSIS — H15033 Posterior scleritis, bilateral: Secondary | ICD-10-CM | POA: Diagnosis not present

## 2021-11-13 ENCOUNTER — Encounter: Payer: Self-pay | Admitting: Internal Medicine

## 2021-11-13 ENCOUNTER — Ambulatory Visit (INDEPENDENT_AMBULATORY_CARE_PROVIDER_SITE_OTHER): Payer: Medicare HMO | Admitting: Internal Medicine

## 2021-11-13 VITALS — BP 136/78 | HR 86 | Temp 98.6°F | Resp 16 | Ht 68.0 in | Wt 197.8 lb

## 2021-11-13 DIAGNOSIS — R972 Elevated prostate specific antigen [PSA]: Secondary | ICD-10-CM

## 2021-11-13 NOTE — Patient Instructions (Addendum)
It was great seeing you today!  Plan discussed at today's visit: -Plan to repeat labs in 6 months, please let me know if you would like to see the Urologist sooner than that  Follow up in: 6 months  Take care and let us know if you have any questions or concerns prior to your next visit.  Dr. Rosana Berger

## 2021-11-13 NOTE — Progress Notes (Signed)
Established Patient Office Visit  Subjective:  Patient ID: [name] Foster, male    DOB: 1941/09/13  Age: 81 y.o. MRN: [id number]  CC:  Chief Complaint  Patient presents with   Elevated PSA    HPI [name] Foster presents to discuss elevation in PSA. Last PSA 10/22 4.33, time before that 7/21 4.03. No dysuria, hematuria, no increase in urinary urgency or frequency. NO suprapubic, back or flank pain. No difficulties with initiation of urine stream, no starting and stopping with urination. 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 He does have a family history of prostate cancer in his father, he was first diagnosed at age 42 and passed away from prostate cancer at age 56.   Past Medical History:  Diagnosis Date   Arthritis    BPH (benign prostatic hyperplasia)    Former smoker, stopped smoking many years ago    History of needle biopsy of prostate with negative result 2013   Hypertension    Prostate enlargement    Prostatic intraepithelial neoplasm III 2013    Past Surgical History:  Procedure Laterality Date   CATARACT EXTRACTION W/PHACO Right 06/17/2021   Procedure: CATARACT EXTRACTION PHACO AND INTRAOCULAR LENS PLACEMENT (IOC) RIGHT;  Surgeon: Eulogio Bear, MD;  Location: [place];  Service: Ophthalmology;  Laterality: Right;  10.20 0:52.5   CATARACT EXTRACTION W/PHACO Left 08/05/2021   Procedure: CATARACT EXTRACTION PHACO AND INTRAOCULAR LENS PLACEMENT (IOC) LEFT;  Surgeon: Eulogio Bear, MD;  Location: [place];  Service: Ophthalmology;  Laterality: Left;  4.21 00:31.2   ESOPHAGOGASTRODUODENOSCOPY N/A 06/26/2019   Procedure: ESOPHAGOGASTRODUODENOSCOPY (EGD);  Surgeon: Lin Landsman, MD;  Location: [facility];  Service: Gastroenterology;  Laterality: N/A;   HEMORROIDECTOMY     HERNIA REPAIR     JOINT REPLACEMENT Left    THR   PROSTATE BIOPSY  2013    TOTAL HIP ARTHROPLASTY Left    TOTAL HIP ARTHROPLASTY Right 04/26/2019   Procedure: TOTAL HIP ARTHROPLASTY ANTERIOR APPROACH;  Surgeon: Hessie Knows, MD;  Location: [facility];  Service: Orthopedics;  Laterality: Right;    Family History  Problem Relation Age of Onset   Prostate cancer Father    Cancer Father    Prostate cancer Brother    Cancer Mother     Social History   Socioeconomic History   Marital status: Married    Spouse name: Not on file   Number of children: 4   Years of education: Not on file   Highest education level: Not on file  Occupational History   Not on file  Tobacco Use   Smoking status: Former    Types: Cigarettes    Quit date: 06/04/1980    Years since quitting: 41.4   Smokeless tobacco: Never  Vaping Use   Vaping Use: Never used  Substance and Sexual Activity   Alcohol use: No   Drug use: No   Sexual activity: Yes  Other Topics Concern   Not on file  Social History Narrative   Not on file   Social Determinants of Health   Financial Resource Strain: Low Risk    Difficulty of Paying Living Expenses: Not hard at all  Food Insecurity: No Food Insecurity   Worried About Charity fundraiser in the Last Year: Never true   [place] in the Last Year: Never true  Transportation Needs: No Transportation Needs  Lack of Transportation (Medical): No   Lack of Transportation (Non-Medical): No  Physical Activity: Sufficiently Active   Days of Exercise per Week: 7 days   Minutes of Exercise per Session: 30 min  Stress: No Stress Concern Present   Feeling of Stress : Not at all  Social Connections: Moderately Integrated   Frequency of Communication with Friends and Family: More than three times a week   Frequency of Social Gatherings with Friends and Family: Three times a week   Attends Religious Services: More than 4 times per year   Active Member of Clubs or Organizations: No   Attends Archivist Meetings: Never   Marital Status:  Married  Human resources officer Violence: Not At Risk   Fear of Current or Ex-Partner: No   Emotionally Abused: No   Physically Abused: No   Sexually Abused: No    Outpatient Medications Prior to Visit  Medication Sig Dispense Refill   HUMIRA PEN-PSOR/UVEIT STARTER 80 MG/0.8ML & 40MG /0.4ML PNKT      Multiple Vitamin (MULTIVITAMIN WITH MINERALS) TABS tablet Take 1 tablet by mouth daily.     No facility-administered medications prior to visit.    Allergies  Allergen Reactions   Methotrexate     Other reaction(s): Weakness,     ROS Review of Systems  Constitutional:  Negative for chills, fatigue and fever.  Genitourinary:  Negative for decreased urine volume, difficulty urinating, dysuria, flank pain, frequency, hematuria and urgency.     Objective:    Physical Exam Constitutional:      Appearance: Normal appearance.  HENT:     Head: Normocephalic and atraumatic.  Eyes:     Conjunctiva/sclera: Conjunctivae normal.  Cardiovascular:     Rate and Rhythm: Normal rate and regular rhythm.  Pulmonary:     Effort: Pulmonary effort is normal.     Breath sounds: Normal breath sounds.  Musculoskeletal:     Right lower leg: No edema.     Left lower leg: No edema.  Skin:    General: Skin is warm and dry.  Neurological:     General: No focal deficit present.     Mental Status: He is alert. Mental status is at baseline.  Psychiatric:        Mood and Affect: Mood normal.        Behavior: Behavior normal.    BP 136/78    Pulse 86    Temp 98.6 F (37 C)    Resp 16    Ht 5\' 8"  (1.727 m)    Wt 197 lb 12.8 oz (89.7 kg)    SpO2 96%    BMI 30.08 kg/m  Wt Readings from Last 3 Encounters:  11/13/21 197 lb 12.8 oz (89.7 kg)  09/06/21 194 lb (88 kg)  08/05/21 191 lb 9.6 oz (86.9 kg)     Health Maintenance Due  Topic Date Due   COVID-19 Vaccine (2 - Janssen risk series) 09/12/2020    There are no preventive care reminders to display for this patient.  Lab Results  Component Value  Date   TSH 4.289 05/26/2019   Lab Results  Component Value Date   WBC 4.4 02/28/2020   HGB 9.3 (L) 02/28/2020   HCT 29.4 (L) 02/28/2020   MCV 79.9 (L) 02/28/2020   PLT 417 (H) 02/28/2020   Lab Results  Component Value Date   NA 137 02/28/2020   K 4.1 02/28/2020   CO2 30 02/28/2020   GLUCOSE 126 (H) 02/28/2020   BUN  14 02/28/2020   CREATININE 0.67 (L) 02/28/2020   BILITOT 0.2 02/28/2020   ALKPHOS 126 06/24/2019   AST 17 02/28/2020   ALT 11 02/28/2020   PROT 8.0 02/28/2020   ALBUMIN 1.7 (L) 06/24/2019   CALCIUM 8.7 02/28/2020   ANIONGAP 14 09/17/2019   No results found for: CHOL No results found for: HDL No results found for: LDLCALC No results found for: TRIG No results found for: CHOLHDL No results found for: HGBA1C    Assessment & Plan:   1. Elevated PSA: PSA 4.33 in October. No urinary symptoms or BPH symptoms. Discussed at length, ultimately the patient and his wife decided to plan to recheck in 6 months, if PSA elevated further would like to discuss with Urologist.   Follow-up: Return in about 6 months (around 05/13/2022).    Teodora Medici, DO

## 2021-12-16 ENCOUNTER — Telehealth: Payer: Self-pay

## 2021-12-16 NOTE — Telephone Encounter (Signed)
Called pt to inform them that Dr Rosana Berger is not specialty and for them to call [employer] back to inform them.    Copied from [place] [id number]. Topic: General - Other >> Dec 16, 2021  8:57 AM [name] Foster wrote: Reason for CRM: Pt wife called and stated that [facility] is not paying an OV visit because Dr Rosana Berger is considered a specialist. Pt would like to know what to do. Please advise.

## 2022-01-23 DIAGNOSIS — Z79899 Other long term (current) drug therapy: Secondary | ICD-10-CM | POA: Diagnosis not present

## 2022-01-23 DIAGNOSIS — H35363 Drusen (degenerative) of macula, bilateral: Secondary | ICD-10-CM | POA: Diagnosis not present

## 2022-01-23 DIAGNOSIS — Z961 Presence of intraocular lens: Secondary | ICD-10-CM | POA: Diagnosis not present

## 2022-01-23 DIAGNOSIS — H1840 Unspecified corneal degeneration: Secondary | ICD-10-CM | POA: Diagnosis not present

## 2022-01-23 DIAGNOSIS — H15033 Posterior scleritis, bilateral: Secondary | ICD-10-CM | POA: Diagnosis not present

## 2022-01-23 DIAGNOSIS — M0579 Rheumatoid arthritis with rheumatoid factor of multiple sites without organ or systems involvement: Secondary | ICD-10-CM | POA: Diagnosis not present

## 2022-01-23 DIAGNOSIS — L814 Other melanin hyperpigmentation: Secondary | ICD-10-CM | POA: Diagnosis not present

## 2022-01-23 DIAGNOSIS — D708 Other neutropenia: Secondary | ICD-10-CM | POA: Diagnosis not present

## 2022-01-23 DIAGNOSIS — H35052 Retinal neovascularization, unspecified, left eye: Secondary | ICD-10-CM | POA: Diagnosis not present

## 2022-01-23 DIAGNOSIS — H43813 Vitreous degeneration, bilateral: Secondary | ICD-10-CM | POA: Diagnosis not present

## 2022-03-07 ENCOUNTER — Ambulatory Visit: Payer: Medicare HMO | Admitting: Internal Medicine

## 2022-05-06 NOTE — Progress Notes (Signed)
Established Patient Office Visit  Subjective:  Patient ID: [name] Foster, male    DOB: 1941/04/13  Age: 81 y.o. MRN: [id number]  CC:  Chief Complaint  Patient presents with   Follow-up    6 month follow up    HPI [name] Foster presents for follow up on chronic medical conditions.  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  His PSA in April, 2021 was 2.7. It was checked again in July 2021 and it had increased to 4.03.  -Mild BPH symptoms.  -He had seen Urology in the past for this. He does have a family history of prostate cancer in his father and brother.   Health Maintenance: -Blood work due   Past Medical History:  Diagnosis Date   Arthritis    BPH (benign prostatic hyperplasia)    Former smoker, stopped smoking many years ago    History of needle biopsy of prostate with negative result 2013   Hypertension    Prostate enlargement    Prostatic intraepithelial neoplasm III 2013    Past Surgical History:  Procedure Laterality Date   CATARACT EXTRACTION W/PHACO Right 06/17/2021   Procedure: CATARACT EXTRACTION PHACO AND INTRAOCULAR LENS PLACEMENT (IOC) RIGHT;  Surgeon: Eulogio Bear, MD;  Location: [place];  Service: Ophthalmology;  Laterality: Right;  10.20 0:52.5   CATARACT EXTRACTION W/PHACO Left 08/05/2021   Procedure: CATARACT EXTRACTION PHACO AND INTRAOCULAR LENS PLACEMENT (IOC) LEFT;  Surgeon: Eulogio Bear, MD;  Location: [place];  Service: Ophthalmology;  Laterality: Left;   4.21 00:31.2   ESOPHAGOGASTRODUODENOSCOPY N/A 06/26/2019   Procedure: ESOPHAGOGASTRODUODENOSCOPY (EGD);  Surgeon: Lin Landsman, MD;  Location: [facility];  Service: Gastroenterology;  Laterality: N/A;   HEMORROIDECTOMY     HERNIA REPAIR     JOINT REPLACEMENT Left    THR   PROSTATE BIOPSY  2013   TOTAL HIP ARTHROPLASTY Left    TOTAL HIP ARTHROPLASTY Right 04/26/2019   Procedure: TOTAL HIP ARTHROPLASTY ANTERIOR APPROACH;  Surgeon: Hessie Knows, MD;  Location: [facility];  Service: Orthopedics;  Laterality: Right;    Family History  Problem Relation Age of Onset   Prostate cancer Father    Cancer Father    Prostate cancer Brother    Cancer Mother     Social History   Socioeconomic History   Marital status: Married    Spouse name: Not on file   Number of children: 4   Years of education: Not on file   Highest education level: Not on file  Occupational History   Not on file  Tobacco Use   Smoking status: Former    Types: Cigarettes    Quit date: 06/04/1980    Years since quitting: 41.9   Smokeless tobacco: Never  Vaping Use   Vaping Use: Never used  Substance and Sexual Activity   Alcohol use: No   Drug use: No   Sexual activity: Yes  Other Topics Concern   Not on file  Social History Narrative   Not on file   Social Determinants of Health   Financial  Resource Strain: Low Risk  (06/06/2021)   Overall Financial Resource Strain (CARDIA)    Difficulty of Paying Living Expenses: Not hard at all  Food Insecurity: No Food Insecurity (06/06/2021)   Hunger Vital Sign    Worried About Running Out of Food in the Last Year: Never true    Ran Out of Food in the Last Year: Never true  Transportation Needs: No Transportation Needs (06/06/2021)   PRAPARE - Hydrologist (Medical): No    Lack of Transportation (Non-Medical): No  Physical Activity: Sufficiently Active (06/06/2021)   Exercise Vital Sign    Days of Exercise per Week: 7 days     Minutes of Exercise per Session: 30 min  Stress: No Stress Concern Present (06/06/2021)   [place]    Feeling of Stress : Not at all  Social Connections: Moderately Integrated (06/06/2021)   Social Connection and Isolation Panel [NHANES]    Frequency of Communication with Friends and Family: More than three times a week    Frequency of Social Gatherings with Friends and Family: Three times a week    Attends Religious Services: More than 4 times per year    Active Member of Clubs or Organizations: No    Attends Archivist Meetings: Never    Marital Status: Married  Human resources officer Violence: Not At Risk (06/06/2021)   Humiliation, Afraid, Rape, and Kick questionnaire    Fear of Current or Ex-Partner: No    Emotionally Abused: No    Physically Abused: No    Sexually Abused: No    Outpatient Medications Prior to Visit  Medication Sig Dispense Refill   HUMIRA PEN-PSOR/UVEIT STARTER 80 MG/0.8ML & '40MG'$ /0.4ML PNKT      Multiple Vitamin (MULTIVITAMIN WITH MINERALS) TABS tablet Take 1 tablet by mouth daily.     No facility-administered medications prior to visit.    Allergies  Allergen Reactions   Methotrexate     Other reaction(s): Weakness,     ROS Review of Systems  Constitutional:  Negative for chills and fever.  Eyes:  Negative for visual disturbance.  Respiratory:  Negative for cough, shortness of breath and wheezing.   Cardiovascular:  Negative for chest pain.  Genitourinary:  Positive for frequency. Negative for dysuria.      Objective:    Physical Exam Constitutional:      Appearance: Normal appearance.  HENT:     Head: Normocephalic and atraumatic.  Eyes:     Conjunctiva/sclera: Conjunctivae normal.  Cardiovascular:     Rate and Rhythm: Normal rate and regular rhythm.  Pulmonary:     Effort: Pulmonary effort is normal.     Breath sounds: Normal breath sounds.  Musculoskeletal:      Right lower leg: No edema.     Left lower leg: No edema.  Skin:    General: Skin is warm and dry.  Neurological:     General: No focal deficit present.     Mental Status: He is alert. Mental status is at baseline.  Psychiatric:        Mood and Affect: Mood normal.        Behavior: Behavior normal.     BP 118/72   Pulse 74   Temp 98.1 F (36.7 C) (Oral)   Resp 16   Ht '5\' 8"'$  (1.727 m)   Wt 186 lb 11.2 oz (84.7 kg)   SpO2 98%   BMI 28.39 kg/m  Wt  Readings from Last 3 Encounters:  05/07/22 186 lb 11.2 oz (84.7 kg)  11/13/21 197 lb 12.8 oz (89.7 kg)  09/06/21 194 lb (88 kg)     Health Maintenance Due  Topic Date Due   Zoster Vaccines- Shingrix (1 of 2) Never done   COVID-19 Vaccine (2 - Janssen risk series) 09/12/2020    There are no preventive care reminders to display for this patient.  Lab Results  Component Value Date   TSH 4.289 05/26/2019   Lab Results  Component Value Date   WBC 4.4 02/28/2020   HGB 9.3 (L) 02/28/2020   HCT 29.4 (L) 02/28/2020   MCV 79.9 (L) 02/28/2020   PLT 417 (H) 02/28/2020   Lab Results  Component Value Date   NA 137 02/28/2020   K 4.1 02/28/2020   CO2 30 02/28/2020   GLUCOSE 126 (H) 02/28/2020   BUN 14 02/28/2020   CREATININE 0.67 (L) 02/28/2020   BILITOT 0.2 02/28/2020   ALKPHOS 126 06/24/2019   AST 17 02/28/2020   ALT 11 02/28/2020   PROT 8.0 02/28/2020   ALBUMIN 1.7 (L) 06/24/2019   CALCIUM 8.7 02/28/2020   ANIONGAP 14 09/17/2019   No results found for: "CHOL" No results found for: "HDL" No results found for: "LDLCALC" No results found for: "TRIG" No results found for: "CHOLHDL" No results found for: "HGBA1C"    Assessment & Plan:   1. Elevated PSA: Elevated 6 months ago, recheck today. Per discussion with patient, will refer to Urology if PSA continues to be high.   - PSA  2. Lipid screening: Lipid screening due, will return when fasting.   - Lipid Profile  3. Screening for metabolic disorder: Screening  labs due.   - COMPLETE METABOLIC PANEL WITH GFR  4. 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 Following with Rheumatology, note reviewed from 01/23/22.   5. 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    Follow-up: Return in about 6 months (around 11/06/2022).    Teodora Medici, DO

## 2022-05-07 ENCOUNTER — Encounter: Payer: Self-pay | Admitting: Internal Medicine

## 2022-05-07 ENCOUNTER — Ambulatory Visit (INDEPENDENT_AMBULATORY_CARE_PROVIDER_SITE_OTHER): Payer: Medicare HMO | Admitting: Internal Medicine

## 2022-05-07 VITALS — BP 118/72 | HR 74 | Temp 98.1°F | Resp 16 | Ht 68.0 in | Wt 186.7 lb

## 2022-05-07 DIAGNOSIS — M059 Rheumatoid arthritis with rheumatoid factor, unspecified: Secondary | ICD-10-CM

## 2022-05-07 DIAGNOSIS — Z13228 Encounter for screening for other metabolic disorders: Secondary | ICD-10-CM

## 2022-05-07 DIAGNOSIS — J431 Panlobular emphysema: Secondary | ICD-10-CM

## 2022-05-07 DIAGNOSIS — Z13220 Encounter for screening for lipoid disorders: Secondary | ICD-10-CM | POA: Diagnosis not present

## 2022-05-07 DIAGNOSIS — R972 Elevated prostate specific antigen [PSA]: Secondary | ICD-10-CM | POA: Diagnosis not present

## 2022-05-07 NOTE — Patient Instructions (Addendum)
It was great seeing you today!  Plan discussed at today's visit: -Blood work ordered today, results will be uploaded to [facility]. Please come back any week day from 8-12 or 2-4 for blood work, please be fasting.  -Consider Shingles vaccine, talk to pharmacy   Follow up in: 6 months  Take care and let us know if you have any questions or concerns prior to your next visit.  Dr. Rosana Berger  Zoster Vaccine, Recombinant injection What is this medication? ZOSTER VACCINE (ZOS ter vak SEEN) is a vaccine used to reduce the risk of getting shingles. This vaccine is not used to treat shingles or nerve pain from shingles. This medicine may be used for other purposes; ask your health care provider or pharmacist if you have questions. COMMON BRAND NAME(S): [facility] What should I tell my care team before I take this medication? They need to know if you have any of these conditions: cancer immune system problems an unusual or allergic reaction to Zoster vaccine, other medications, foods, dyes, or preservatives pregnant or trying to get pregnant breast-feeding How should I use this medication? This vaccine is injected into a muscle. It is given by a health care provider. A copy of Vaccine Information Statements will be given before each vaccination. Be sure to read this information carefully each time. This sheet may change often. Talk to your health care provider about the use of this vaccine in children. This vaccine is not approved for use in children. Overdosage: If you think you have taken too much of this medicine contact a poison control center or emergency room at once. NOTE: This medicine is only for you. Do not share this medicine with others. What if I miss a dose? Keep appointments for follow-up (booster) doses. It is important not to miss your dose. Call your health care provider if you are unable to keep an appointment. What may interact with this medication? medicines that suppress your  immune system medicines to treat cancer steroid medicines like prednisone or cortisone This list may not describe all possible interactions. Give your health care provider a list of all the medicines, herbs, non-prescription drugs, or dietary supplements you use. Also tell them if you smoke, drink alcohol, or use illegal drugs. Some items may interact with your medicine. What should I watch for while using this medication? Visit your health care provider regularly. This vaccine, like all vaccines, may not fully protect everyone. What side effects may I notice from receiving this medication? Side effects that you should report to your doctor or health care professional as soon as possible: allergic reactions (skin rash, itching or hives; swelling of the face, lips, or tongue) trouble breathing Side effects that usually do not require medical attention (report these to your doctor or health care professional if they continue or are bothersome): chills headache fever nausea pain, redness, or irritation at site where injected tiredness vomiting This list may not describe all possible side effects. Call your doctor for medical advice about side effects. You may report side effects to FDA at 1-800-FDA-1088. Where should I keep my medication? This vaccine is only given by a health care provider. It will not be stored at home. NOTE: This sheet is a summary. It may not cover all possible information. If you have questions about this medicine, talk to your doctor, pharmacist, or health care provider.  2023 Elsevier/Gold Standard (2021-09-27 00:00:00)

## 2022-05-09 DIAGNOSIS — Z13228 Encounter for screening for other metabolic disorders: Secondary | ICD-10-CM | POA: Diagnosis not present

## 2022-05-09 DIAGNOSIS — Z13220 Encounter for screening for lipoid disorders: Secondary | ICD-10-CM | POA: Diagnosis not present

## 2022-05-09 DIAGNOSIS — R972 Elevated prostate specific antigen [PSA]: Secondary | ICD-10-CM | POA: Diagnosis not present

## 2022-05-10 LAB — COMPLETE METABOLIC PANEL WITH GFR
AG Ratio: 1.2 (calc) (ref 1.0–2.5)
ALT: 17 U/L (ref 9–46)
AST: 22 U/L (ref 10–35)
Albumin: 4.2 g/dL (ref 3.6–5.1)
Alkaline phosphatase (APISO): 110 U/L (ref 35–144)
BUN: 14 mg/dL (ref 7–25)
CO2: 28 mmol/L (ref 20–32)
Calcium: 9.2 mg/dL (ref 8.6–10.3)
Chloride: 104 mmol/L (ref 98–110)
Creat: 1.08 mg/dL (ref 0.70–1.22)
Globulin: 3.6 g/dL (calc) (ref 1.9–3.7)
Glucose, Bld: 102 mg/dL — ABNORMAL HIGH (ref 65–99)
Potassium: 4.9 mmol/L (ref 3.5–5.3)
Sodium: 139 mmol/L (ref 135–146)
Total Bilirubin: 0.6 mg/dL (ref 0.2–1.2)
Total Protein: 7.8 g/dL (ref 6.1–8.1)
eGFR: 69 mL/min/{1.73_m2} (ref >=60)

## 2022-05-10 LAB — LIPID PANEL
Cholesterol: 163 mg/dL (ref <200)
HDL: 46 mg/dL (ref >=40)
LDL Cholesterol (Calc): 100 mg/dL (calc) — ABNORMAL HIGH
Non-HDL Cholesterol (Calc): 117 mg/dL (calc) (ref <130)
Total CHOL/HDL Ratio: 3.5 (calc) (ref <5.0)
Triglycerides: 76 mg/dL (ref <150)

## 2022-05-10 LAB — PSA: PSA: 3.62 ng/mL (ref <=4.00)

## 2022-07-31 DIAGNOSIS — H35363 Drusen (degenerative) of macula, bilateral: Secondary | ICD-10-CM | POA: Diagnosis not present

## 2022-07-31 DIAGNOSIS — H15033 Posterior scleritis, bilateral: Secondary | ICD-10-CM | POA: Diagnosis not present

## 2022-07-31 DIAGNOSIS — Z79899 Other long term (current) drug therapy: Secondary | ICD-10-CM | POA: Diagnosis not present

## 2022-07-31 DIAGNOSIS — H35052 Retinal neovascularization, unspecified, left eye: Secondary | ICD-10-CM | POA: Diagnosis not present

## 2022-07-31 DIAGNOSIS — H43813 Vitreous degeneration, bilateral: Secondary | ICD-10-CM | POA: Diagnosis not present

## 2022-09-07 NOTE — Progress Notes (Unsigned)
   Acute Office Visit  Subjective:     Patient ID: [name] Foster, male    DOB: 1941/03/01, 81 y.o.   MRN: [id number]  No chief complaint on file.   HPI Patient is in today for skin change on leg.   RASH Duration:  {Blank single:19197::"chronic","days","weeks","months"}  Location: {Blank multiple:19196::"generalized","trunk","face","hands","arms","legs","groin"}  Itching: {Blank single:19197::"yes","no"} Burning: {Blank single:19197::"yes","no"} Redness: {Blank single:19197::"yes","no"} Oozing: {Blank single:19197::"yes","no"} Scaling: {Blank single:19197::"yes","no"} Blisters: {Blank single:19197::"yes","no"} Painful: {Blank single:19197::"yes","no"} Fevers: {Blank single:19197::"yes","no"} Change in detergents/soaps/personal care products: {Blank single:19197::"yes","no"} Recent illness: {Blank single:19197::"yes","no"} Recent travel:{Blank single:19197::"yes","no"} History of same: {Blank single:19197::"yes","no"} Context: {Blank multiple:19196::"better","worse","stable","fluctuating","contacts with the same"} Alleviating factors: {Blank multiple:19196::"hydrocortisone cream","benadryl","lotion/moisturizer","nothing"} Treatments attempted:{Blank multiple:19196::"hydrocortisone cream","benadryl","OTC anit-fungal","lotion/moisturizer","nothing"} Shortness of breath: {Blank single:19197::"yes","no"}  Throat/tongue swelling: {Blank single:19197::"yes","no"} Myalgias/arthralgias: {Blank single:19197::"yes","no"}   ROS      Objective:    There were no vitals taken for this visit. {Vitals History (Optional):23777}  Physical Exam  No results found for any visits on 09/08/22.      Assessment & Plan:   Problem List Items Addressed This Visit   None   No orders of the defined types were placed in this encounter.   No follow-ups on file.  Teodora Medici, DO

## 2022-09-08 ENCOUNTER — Ambulatory Visit (INDEPENDENT_AMBULATORY_CARE_PROVIDER_SITE_OTHER): Payer: Medicare HMO | Admitting: Internal Medicine

## 2022-09-08 ENCOUNTER — Encounter: Payer: Self-pay | Admitting: Internal Medicine

## 2022-09-08 VITALS — BP 120/78 | HR 82 | Temp 97.7°F | Resp 16 | Ht 68.0 in | Wt 190.1 lb

## 2022-09-08 DIAGNOSIS — L209 Atopic dermatitis, unspecified: Secondary | ICD-10-CM | POA: Diagnosis not present

## 2022-09-08 MED ORDER — HYDROCORTISONE 1 % EX OINT: 1.0000 | TOPICAL_OINTMENT | Freq: Two times a day (BID) | CUTANEOUS | 0 refills | Status: DC

## 2022-09-08 NOTE — Patient Instructions (Signed)
It was great seeing you today!  Plan discussed at today's visit: -Recommend using over the counter [place] for diabetics twice a day - after you shower and before bed -User steroid cream as needed for itching -Referral to Dermatology placed today   Follow up in: already scheduled   Take care and let us know if you have any questions or concerns prior to your next visit.  Dr. Rosana Berger

## 2022-09-23 DIAGNOSIS — H43813 Vitreous degeneration, bilateral: Secondary | ICD-10-CM | POA: Diagnosis not present

## 2022-09-23 DIAGNOSIS — Z0100 Encounter for examination of eyes and vision without abnormal findings: Secondary | ICD-10-CM | POA: Diagnosis not present

## 2022-11-06 ENCOUNTER — Ambulatory Visit: Payer: Medicare HMO | Admitting: Internal Medicine

## 2022-11-10 NOTE — Progress Notes (Unsigned)
Established Patient Office Visit  Subjective:  Patient ID: [name] Foster, male    DOB: August 22, 1941  Age: 82 y.o. MRN: [id number]  CC:  No chief complaint on file.   HPI GOLDMAN BIRCHALL presents for follow up on chronic medical conditions.  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  -He had seen Urology in the past for this. He does have a family history of prostate cancer in his father and brother.   Health Maintenance: -Blood work due   Past Medical History:  Diagnosis Date   Arthritis    BPH (benign prostatic hyperplasia)    Former smoker, stopped smoking many years ago    History of needle biopsy of prostate with negative result 2013   Hypertension    Prostate enlargement    Prostatic intraepithelial neoplasm III 2013    Past Surgical History:  Procedure Laterality Date   CATARACT EXTRACTION W/PHACO Right 06/17/2021   Procedure: CATARACT EXTRACTION PHACO AND INTRAOCULAR LENS PLACEMENT (IOC) RIGHT;  Surgeon: Eulogio Bear, MD;  Location: [facility];  Service: Ophthalmology;  Laterality: Right;  10.20 0:52.5   CATARACT EXTRACTION W/PHACO Left 08/05/2021   Procedure: CATARACT EXTRACTION PHACO AND INTRAOCULAR LENS PLACEMENT (IOC) LEFT;  Surgeon: Eulogio Bear, MD;  Location: [facility];  Service: Ophthalmology;  Laterality: Left;  4.21 00:31.2   ESOPHAGOGASTRODUODENOSCOPY N/A 06/26/2019   Procedure: ESOPHAGOGASTRODUODENOSCOPY (EGD);  Surgeon: Lin Landsman, MD;  Location: [facility]COPY;  Service: Gastroenterology;  Laterality: N/A;   HEMORROIDECTOMY     HERNIA REPAIR     JOINT REPLACEMENT Left    THR   PROSTATE BIOPSY  2013    TOTAL HIP ARTHROPLASTY Left    TOTAL HIP ARTHROPLASTY Right 04/26/2019   Procedure: TOTAL HIP ARTHROPLASTY ANTERIOR APPROACH;  Surgeon: Hessie Knows, MD;  Location: [facility];  Service: Orthopedics;  Laterality: Right;    Family History  Problem Relation Age of Onset   Prostate cancer Father    Cancer Father    Prostate cancer Brother    Cancer Mother     Social History   Socioeconomic History   Marital status: Married    Spouse name: Not on file   Number of children: 4   Years of education: Not on file   Highest education level: Not on file  Occupational History   Not on file  Tobacco Use   Smoking status: Former    Types: Cigarettes    Quit date: 06/04/1980    Years since quitting: 42.4   Smokeless tobacco: Never  Vaping Use   Vaping Use: Never used  Substance and Sexual Activity   Alcohol use: No   Drug use: No   Sexual activity: Yes  Other Topics Concern   Not on file  Social History Narrative   Not on file   Social Determinants of Health   Financial Resource Strain: Low Risk  (06/06/2021)   Overall Financial Resource Strain (CARDIA)    Difficulty of Paying Living Expenses: Not hard at all  Food Insecurity: No Food Insecurity (06/06/2021)   Hunger Vital Sign    Worried About Running Out of Food in the Last Year: Never true    Ran Out of Food in the Last Year: Never true  Transportation Needs: No Transportation Needs (06/06/2021)   PRAPARE - Hydrologist (Medical): No    Lack of Transportation (Non-Medical): No  Physical Activity: Sufficiently Active (06/06/2021)   Exercise Vital Sign    Days of Exercise per Week: 7 days    Minutes of Exercise per Session: 30 min  Stress: No Stress Concern Present (06/06/2021)   [place]    Feeling of Stress : Not at all  Social Connections: Moderately Integrated (06/06/2021)   Social Connection and Isolation Panel [NHANES]     Frequency of Communication with Friends and Family: More than three times a week    Frequency of Social Gatherings with Friends and Family: Three times a week    Attends Religious Services: More than 4 times per year    Active Member of Clubs or Organizations: No    Attends Archivist Meetings: Never    Marital Status: Married  Human resources officer Violence: Not At Risk (06/06/2021)   Humiliation, Afraid, Rape, and Kick questionnaire    Fear of Current or Ex-Partner: No    Emotionally Abused: No    Physically Abused: No    Sexually Abused: No    Outpatient Medications Prior to Visit  Medication Sig Dispense Refill   HUMIRA PEN-PSOR/UVEIT STARTER 80 MG/0.8ML & 40MG/0.4ML PNKT      hydrocortisone 1 % ointment Apply 1 Application topically 2 (two) times daily. 30 g 0   Multiple Vitamin (MULTIVITAMIN WITH MINERALS) TABS tablet Take 1 tablet by mouth daily.     No facility-administered medications prior to visit.    Allergies  Allergen Reactions   Methotrexate     Other reaction(s): Weakness,     ROS Review of Systems  Constitutional:  Negative for chills and fever.  Eyes:  Negative for visual disturbance.  Respiratory:  Negative for cough, shortness of breath and wheezing.   Cardiovascular:  Negative for chest pain.  Genitourinary:  Positive for frequency. Negative for dysuria.      Objective:    Physical Exam Constitutional:      Appearance: Normal appearance.  HENT:     Head: Normocephalic and atraumatic.  Eyes:     Conjunctiva/sclera: Conjunctivae normal.  Cardiovascular:     Rate and Rhythm: Normal rate and regular rhythm.  Pulmonary:     Effort: Pulmonary effort is normal.     Breath sounds: Normal breath sounds.  Musculoskeletal:     Right lower leg: No edema.     Left lower leg: No edema.  Skin:    General: Skin is warm and dry.  Neurological:     General: No focal deficit present.     Mental Status: He is alert. Mental status is at baseline.   Psychiatric:        Mood and Affect: Mood normal.        Behavior: Behavior normal.     There were no vitals taken for this visit. Wt Readings from Last 3 Encounters:  09/08/22 190 lb 1.6 oz (86.2 kg)  05/07/22 186 lb 11.2 oz (84.7 kg)  11/13/21 197 lb 12.8 oz (89.7 kg)     Health Maintenance Due  Topic Date Due   DTaP/Tdap/Td (1 - Tdap) Never done   COVID-19 Vaccine (2 - Janssen risk series) 09/12/2020    There are no preventive care reminders to display for this patient.  Lab Results  Component Value Date   TSH 4.289 05/26/2019  Lab Results  Component Value Date   WBC 4.4 02/28/2020   HGB 9.3 (L) 02/28/2020   HCT 29.4 (L) 02/28/2020   MCV 79.9 (L) 02/28/2020   PLT 417 (H) 02/28/2020   Lab Results  Component Value Date   NA 139 05/09/2022   K 4.9 05/09/2022   CO2 28 05/09/2022   GLUCOSE 102 (H) 05/09/2022   BUN 14 05/09/2022   CREATININE 1.08 05/09/2022   BILITOT 0.6 05/09/2022   ALKPHOS 126 06/24/2019   AST 22 05/09/2022   ALT 17 05/09/2022   PROT 7.8 05/09/2022   ALBUMIN 1.7 (L) 06/24/2019   CALCIUM 9.2 05/09/2022   ANIONGAP 14 09/17/2019   EGFR 69 05/09/2022   Lab Results  Component Value Date   CHOL 163 05/09/2022   Lab Results  Component Value Date   HDL 46 05/09/2022   Lab Results  Component Value Date   LDLCALC 100 (H) 05/09/2022   Lab Results  Component Value Date   TRIG 76 05/09/2022   Lab Results  Component Value Date   CHOLHDL 3.5 05/09/2022   No results found for: "HGBA1C"    Assessment & Plan:   1. Elevated PSA: Elevated 6 months ago, recheck today. Per discussion with patient, will refer to Urology if PSA continues to be high.   - PSA  2. Lipid screening: Lipid screening due, will return when fasting.   - Lipid Profile  3. Screening for metabolic disorder: Screening labs due.   - COMPLETE METABOLIC PANEL WITH GFR  4. Seropositive rheumatoid arthritis ([place]): Stable, doing well on Humira, symptoms well  controlled. Following with Rheumatology, note reviewed from 01/23/22.   5. 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    Follow-up: No follow-ups on file.    Teodora Medici, DO

## 2022-11-11 ENCOUNTER — Encounter: Payer: Self-pay | Admitting: Internal Medicine

## 2022-11-11 ENCOUNTER — Ambulatory Visit (INDEPENDENT_AMBULATORY_CARE_PROVIDER_SITE_OTHER): Payer: Medicare HMO | Admitting: Internal Medicine

## 2022-11-11 VITALS — BP 125/72 | HR 75 | Temp 97.8°F | Resp 18 | Ht 68.0 in | Wt 196.9 lb

## 2022-11-11 DIAGNOSIS — M059 Rheumatoid arthritis with rheumatoid factor, unspecified: Secondary | ICD-10-CM

## 2022-11-11 DIAGNOSIS — Z87898 Personal history of other specified conditions: Secondary | ICD-10-CM

## 2022-11-11 DIAGNOSIS — L209 Atopic dermatitis, unspecified: Secondary | ICD-10-CM

## 2022-11-11 DIAGNOSIS — J431 Panlobular emphysema: Secondary | ICD-10-CM | POA: Diagnosis not present

## 2022-11-12 LAB — PSA: PSA: 3.8 ng/mL (ref <=4.00)

## 2022-12-30 ENCOUNTER — Encounter: Payer: Self-pay | Admitting: Oncology

## 2022-12-30 ENCOUNTER — Telehealth: Payer: Self-pay | Admitting: Internal Medicine

## 2022-12-30 NOTE — Telephone Encounter (Signed)
Contacted Olly E Cruces to schedule their annual wellness visit. Appointment made for 01/30/2023. Scheduled with patient's wife.[place] Direct Dial: [phone]

## 2023-01-29 DIAGNOSIS — D708 Other neutropenia: Secondary | ICD-10-CM | POA: Diagnosis not present

## 2023-01-29 DIAGNOSIS — H15033 Posterior scleritis, bilateral: Secondary | ICD-10-CM | POA: Diagnosis not present

## 2023-01-29 DIAGNOSIS — M0579 Rheumatoid arthritis with rheumatoid factor of multiple sites without organ or systems involvement: Secondary | ICD-10-CM | POA: Diagnosis not present

## 2023-01-29 DIAGNOSIS — Z79899 Other long term (current) drug therapy: Secondary | ICD-10-CM | POA: Diagnosis not present

## 2023-01-29 DIAGNOSIS — H35052 Retinal neovascularization, unspecified, left eye: Secondary | ICD-10-CM | POA: Diagnosis not present

## 2023-01-29 DIAGNOSIS — H35363 Drusen (degenerative) of macula, bilateral: Secondary | ICD-10-CM | POA: Diagnosis not present

## 2023-01-29 DIAGNOSIS — H43813 Vitreous degeneration, bilateral: Secondary | ICD-10-CM | POA: Diagnosis not present

## 2023-01-30 ENCOUNTER — Ambulatory Visit (INDEPENDENT_AMBULATORY_CARE_PROVIDER_SITE_OTHER): Payer: Medicare HMO

## 2023-01-30 VITALS — Ht 68.0 in | Wt 190.0 lb

## 2023-01-30 DIAGNOSIS — Z0000 Encounter for general adult medical examination without abnormal findings: Secondary | ICD-10-CM | POA: Diagnosis not present

## 2023-01-30 NOTE — Patient Instructions (Signed)
[name] Foster , Thank you for taking time to come for your Medicare Wellness Visit. I appreciate your ongoing commitment to your health goals. Please review the following plan we discussed and let me know if I can assist you in the future.   These are the goals we discussed:  Goals   None     This is a list of the screening recommended for you and due dates:  Health Maintenance  Topic Date Due   Zoster (Shingles) Vaccine (1 of 2) Never done   COVID-19 Vaccine (2 - Janssen risk series) 09/12/2020   Flu Shot  02/08/2023*   Pneumonia Vaccine (2 of 2 - PPSV23 or PCV20) 09/09/2023*   HPV Vaccine  Aged Out   DTaP/Tdap/Td vaccine  Discontinued  *Topic was postponed. The date shown is not the original due date.    Advanced directives: YES  Conditions/risks identified: NONE  Next appointment: Follow up in one year for your annual wellness visit. 02/05/2024 @9 :45am telephone  Preventive Care 69 Years and Older, Male  Preventive care refers to lifestyle choices and visits with your health care provider that can promote health and wellness. What does preventive care include? A yearly physical exam. This is also called an annual well check. Dental exams once or twice a year. Routine eye exams. Ask your health care provider how often you should have your eyes checked. Personal lifestyle choices, including: Daily care of your teeth and gums. Regular physical activity. Eating a healthy diet. Avoiding tobacco and drug use. Limiting alcohol use. Practicing safe sex. Taking low doses of aspirin every day. Taking vitamin and mineral supplements as recommended by your health care provider. What happens during an annual well check? The services and screenings done by your health care provider during your annual well check will depend on your age, overall health, lifestyle risk factors, and family history of disease. Counseling  Your health care provider may ask you questions about  your: Alcohol use. Tobacco use. Drug use. Emotional well-being. Home and relationship well-being. Sexual activity. Eating habits. History of falls. Memory and ability to understand (cognition). Work and work Statistician. Screening  You may have the following tests or measurements: Height, weight, and BMI. Blood pressure. Lipid and cholesterol levels. These may be checked every 5 years, or more frequently if you are over 58 years old. Skin check. Lung cancer screening. You may have this screening every year starting at age 59 if you have a 30-pack-year history of smoking and currently smoke or have quit within the past 15 years. Fecal occult blood test (FOBT) of the stool. You may have this test every year starting at age 20. Flexible sigmoidoscopy or colonoscopy. You may have a sigmoidoscopy every 5 years or a colonoscopy every 10 years starting at age 16. Prostate cancer screening. Recommendations will vary depending on your family history and other risks. Hepatitis C blood test. Hepatitis B blood test. Sexually transmitted disease (STD) testing. Diabetes screening. This is done by checking your blood sugar (glucose) after you have not eaten for a while (fasting). You may have this done every 1-3 years. Abdominal aortic aneurysm (AAA) screening. You may need this if you are a current or former smoker. Osteoporosis. You may be screened starting at age 23 if you are at high risk. Talk with your health care provider about your test results, treatment options, and if necessary, the need for more tests. Vaccines  Your health care provider may recommend certain vaccines, such as: Influenza vaccine. This  is recommended every year. Tetanus, diphtheria, and acellular pertussis (Tdap, Td) vaccine. You may need a Td booster every 10 years. Zoster vaccine. You may need this after age 53. Pneumococcal 13-valent conjugate (PCV13) vaccine. One dose is recommended after age 85. Pneumococcal  polysaccharide (PPSV23) vaccine. One dose is recommended after age 17. Talk to your health care provider about which screenings and vaccines you need and how often you need them. This information is not intended to replace advice given to you by your health care provider. Make sure you discuss any questions you have with your health care provider. Document Released: 11/23/2015 Document Revised: 07/16/2016 Document Reviewed: 08/28/2015 Elsevier Interactive Patient Education  2017 [place] Prevention in the Home Falls can cause injuries. They can happen to people of all ages. There are many things you can do to make your home safe and to help prevent falls. What can I do on the outside of my home? Regularly fix the edges of walkways and driveways and fix any cracks. Remove anything that might make you trip as you walk through a door, such as a raised step or threshold. Trim any bushes or trees on the path to your home. Use bright outdoor lighting. Clear any walking paths of anything that might make someone trip, such as rocks or tools. Regularly check to see if handrails are loose or broken. Make sure that both sides of any steps have handrails. Any raised decks and porches should have guardrails on the edges. Have any leaves, snow, or ice cleared regularly. Use sand or salt on walking paths during winter. Clean up any spills in your garage right away. This includes oil or grease spills. What can I do in the bathroom? Use night lights. Install grab bars by the toilet and in the tub and shower. Do not use towel bars as grab bars. Use non-skid mats or decals in the tub or shower. If you need to sit down in the shower, use a plastic, non-slip stool. Keep the floor dry. Clean up any water that spills on the floor as soon as it happens. Remove soap buildup in the tub or shower regularly. Attach bath mats securely with double-sided non-slip rug tape. Do not have throw rugs and other  things on the floor that can make you trip. What can I do in the bedroom? Use night lights. Make sure that you have a light by your bed that is easy to reach. Do not use any sheets or blankets that are too big for your bed. They should not hang down onto the floor. Have a firm chair that has side arms. You can use this for support while you get dressed. Do not have throw rugs and other things on the floor that can make you trip. What can I do in the kitchen? Clean up any spills right away. Avoid walking on wet floors. Keep items that you use a lot in easy-to-reach places. If you need to reach something above you, use a strong step stool that has a grab bar. Keep electrical cords out of the way. Do not use floor polish or wax that makes floors slippery. If you must use wax, use non-skid floor wax. Do not have throw rugs and other things on the floor that can make you trip. What can I do with my stairs? Do not leave any items on the stairs. Make sure that there are handrails on both sides of the stairs and use them. Fix handrails that  are broken or loose. Make sure that handrails are as long as the stairways. Check any carpeting to make sure that it is firmly attached to the stairs. Fix any carpet that is loose or worn. Avoid having throw rugs at the top or bottom of the stairs. If you do have throw rugs, attach them to the floor with carpet tape. Make sure that you have a light switch at the top of the stairs and the bottom of the stairs. If you do not have them, ask someone to add them for you. What else can I do to help prevent falls? Wear shoes that: Do not have high heels. Have rubber bottoms. Are comfortable and fit you well. Are closed at the toe. Do not wear sandals. If you use a stepladder: Make sure that it is fully opened. Do not climb a closed stepladder. Make sure that both sides of the stepladder are locked into place. Ask someone to hold it for you, if possible. Clearly  mark and make sure that you can see: Any grab bars or handrails. First and last steps. Where the edge of each step is. Use tools that help you move around (mobility aids) if they are needed. These include: Canes. Walkers. Scooters. Crutches. Turn on the lights when you go into a dark area. Replace any light bulbs as soon as they burn out. Set up your furniture so you have a clear path. Avoid moving your furniture around. If any of your floors are uneven, fix them. If there are any pets around you, be aware of where they are. Review your medicines with your doctor. Some medicines can make you feel dizzy. This can increase your chance of falling. Ask your doctor what other things that you can do to help prevent falls. This information is not intended to replace advice given to you by your health care provider. Make sure you discuss any questions you have with your health care provider. Document Released: 08/23/2009 Document Revised: 04/03/2016 Document Reviewed: 12/01/2014 Elsevier Interactive Patient Education  2017 [employer].

## 2023-01-30 NOTE — Progress Notes (Signed)
Subjective:   [name] Foster is a 82 y.o. male who presents for Medicare Annual/Subsequent preventive examination.  Review of Systems    Cardiac Risk Factors include: advanced age (>74men, >43 women);male gender    Objective:    Today's Vitals   01/30/23 0948  Weight: 190 lb (86.2 kg)  Height: 5\' 8"  (1.727 m)   Body mass index is 28.89 kg/m.     01/30/2023    9:55 AM 08/05/2021    6:56 AM 06/17/2021    9:42 AM 06/06/2021   10:14 AM 06/05/2020   10:21 AM 09/17/2019    3:50 PM 07/19/2019    1:19 PM  Advanced Directives  Does Patient Have a Medical Advance Directive? Yes No Yes Yes Yes No   Type of Scientist, physiological of [place];Living will [place];Living will [place];Living will    Does patient want to make changes to medical advance directive?   No - Patient declined      Copy of [place] in Chart?   Yes - validated most recent copy scanned in chart (See row information) Yes - validated most recent copy scanned in chart (See row information) Yes - validated most recent copy scanned in chart (See row information)    Would patient like information on creating a medical advance directive?  No - Patient declined     No - Patient declined    Current Medications (verified) Outpatient Encounter Medications as of 01/30/2023  Medication Sig   HUMIRA PEN-PSOR/UVEIT STARTER 80 MG/0.8ML & 40MG /0.4ML PNKT    hydrocortisone 1 % ointment Apply 1 Application topically 2 (two) times daily.   Multiple Vitamin (MULTIVITAMIN WITH MINERALS) TABS tablet Take 1 tablet by mouth daily.   No facility-administered encounter medications on file as of 01/30/2023.    Allergies (verified) Methotrexate   History: Past Medical History:  Diagnosis Date   Arthritis    BPH (benign prostatic hyperplasia)    Former smoker, stopped smoking many years ago    History of needle biopsy of prostate with negative result 2013    Hypertension    Prostate enlargement    Prostatic intraepithelial neoplasm III 2013   Past Surgical History:  Procedure Laterality Date   CATARACT EXTRACTION W/PHACO Right 06/17/2021   Procedure: CATARACT EXTRACTION PHACO AND INTRAOCULAR LENS PLACEMENT (IOC) RIGHT;  Surgeon: Eulogio Bear, MD;  Location: [place];  Service: Ophthalmology;  Laterality: Right;  10.20 0:52.5   CATARACT EXTRACTION W/PHACO Left 08/05/2021   Procedure: CATARACT EXTRACTION PHACO AND INTRAOCULAR LENS PLACEMENT (IOC) LEFT;  Surgeon: Eulogio Bear, MD;  Location: [place];  Service: Ophthalmology;  Laterality: Left;  4.21 00:31.2   ESOPHAGOGASTRODUODENOSCOPY N/A 06/26/2019   Procedure: ESOPHAGOGASTRODUODENOSCOPY (EGD);  Surgeon: Lin Landsman, MD;  Location: [facility];  Service: Gastroenterology;  Laterality: N/A;   HEMORROIDECTOMY     HERNIA REPAIR     JOINT REPLACEMENT Left    THR   PROSTATE BIOPSY  2013   TOTAL HIP ARTHROPLASTY Left    TOTAL HIP ARTHROPLASTY Right 04/26/2019   Procedure: TOTAL HIP ARTHROPLASTY ANTERIOR APPROACH;  Surgeon: Hessie Knows, MD;  Location: [facility];  Service: Orthopedics;  Laterality: Right;   Family History  Problem Relation Age of Onset   Prostate cancer Father    Cancer Father    Prostate cancer Brother    Cancer Mother    Social History   Socioeconomic History   Marital status:  Married    Spouse name: Not on file   Number of children: 4   Years of education: Not on file   Highest education level: Not on file  Occupational History   Not on file  Tobacco Use   Smoking status: Former    Types: Cigarettes    Quit date: 06/04/1980    Years since quitting: 42.6   Smokeless tobacco: Never  Vaping Use   Vaping Use: Never used  Substance and Sexual Activity   Alcohol use: No   Drug use: No   Sexual activity: Yes  Other Topics Concern   Not on file  Social History Narrative   Not on file   Social Determinants of Health    Financial Resource Strain: Low Risk  (01/30/2023)   Overall Financial Resource Strain (CARDIA)    Difficulty of Paying Living Expenses: Not hard at all  Food Insecurity: No Food Insecurity (01/30/2023)   Hunger Vital Sign    Worried About Running Out of Food in the Last Year: Never true    [place] in the Last Year: Never true  Transportation Needs: No Transportation Needs (01/30/2023)   PRAPARE - Hydrologist (Medical): No    Lack of Transportation (Non-Medical): No  Physical Activity: Sufficiently Active (01/30/2023)   Exercise Vital Sign    Days of Exercise per Week: 7 days    Minutes of Exercise per Session: 30 min  Stress: No Stress Concern Present (01/30/2023)   [place]    Feeling of Stress : Not at all  Social Connections: [place] (01/30/2023)   Social Connection and Isolation Panel [NHANES]    Frequency of Communication with Friends and Family: More than three times a week    Frequency of Social Gatherings with Friends and Family: Twice a week    Attends Religious Services: More than 4 times per year    Active Member of [employer] or Organizations: Yes    Attends Archivist Meetings: Never    Marital Status: Married    Tobacco Counseling Counseling given: Not Answered   Clinical Intake:  Pre-visit preparation completed: Yes  Pain : No/denies pain     BMI - recorded: 28.89 Nutritional Status: BMI 25 -29 Overweight Nutritional Risks: None Diabetes: No     Diabetic?no  Interpreter Needed?: No  Comments: lives with spouse Information entered by :: B.Nohealani Medinger,LPN   Activities of Daily Living    01/30/2023    9:57 AM 11/11/2022    9:36 AM  In your present state of health, do you have any difficulty performing the following activities:  Hearing? 1 0  Vision? 0 0  Difficulty concentrating or making decisions? 0 0  Walking or climbing  stairs? 0 0  Dressing or bathing? 0 0  Doing errands, shopping? 0 0  Preparing Food and eating ? N   Using the Toilet? N   In the past six months, have you accidently leaked urine? N   Do you have problems with loss of bowel control? N   Managing your Medications? N   Managing your Finances? N   Housekeeping or managing your Housekeeping? N     Patient Care Team: Teodora Medici, DO as PCP - General (Internal Medicine) Janna Arch, MD (Ophthalmology) Raelyn Number Lyn Records, NP as Nurse Practitioner (Rheumatology)  Indicate any recent Medical Services you may have received from other than [facility] providers in the past year (  date may be approximate).     Assessment:   This is a routine wellness examination for [name] Foster.  Hearing/Vision screen Hearing Screening - Comments:: Little hearing loss but ok for now.does not need testing Vision Screening - Comments:: Adequate vision  Dr Meliton Rattan  Dietary issues and exercise activities discussed: Current Exercise Habits: Home exercise routine, Type of exercise: walking;stretching, Time (Minutes): 30, Frequency (Times/Week): 7, Weekly Exercise (Minutes/Week): 210, Intensity: Mild, Exercise limited by: None identified   Goals Addressed   None    Depression Screen    01/30/2023    9:52 AM 11/11/2022    9:36 AM 09/08/2022   10:56 AM 05/07/2022    8:44 AM 11/13/2021    9:02 AM 09/06/2021   10:04 AM 06/06/2021   10:13 AM  PHQ 2/9 Scores  PHQ - 2 Score 0 0 0 0 0 0 0  PHQ- 9 Score  0 0 0 0 0     Fall Risk    01/30/2023    9:51 AM 11/11/2022    9:33 AM 09/08/2022   10:56 AM 05/07/2022    8:44 AM 11/13/2021    9:02 AM  Fall Risk   Falls in the past year? 0 0 0 0 0  Number falls in past yr: 0 0 0 0 0  Injury with Fall? 0 0 0 0 0  Risk for fall due to : No Fall Risks      Follow up Education provided;Falls prevention discussed        FALL RISK PREVENTION PERTAINING TO THE HOME:  Any stairs in or around the home? Yes 3 outside   If so, are there any without handrails? Yes  Home free of loose throw rugs in walkways, pet beds, electrical cords, etc? Yes  Adequate lighting in your home to reduce risk of falls? Yes   ASSISTIVE DEVICES UTILIZED TO PREVENT FALLS:  Life alert? No  Use of a cane, walker or w/c? No  Grab bars in the bathroom? Yes  Shower chair or bench in shower? Yes  Elevated toilet seat or a handicapped toilet? Yes   Cognitive Function:        01/30/2023    9:58 AM 06/05/2020   10:23 AM  6CIT Screen  What Year? 0 points 0 points  What month? 0 points 0 points  What time? 0 points 0 points  Count back from 20 0 points 0 points  Months in reverse 0 points 0 points  Repeat phrase 0 points 2 points  Total Score 0 points 2 points    Immunizations Immunization History  Administered Date(s) Administered   Influenza, Seasonal, Injecte, Preservative Fre 09/02/2013   Janssen (J&J) SARS-COV-2 Vaccination 08/15/2020   Pneumococcal Conjugate-13 03/03/2014    TDAP status: Due, Education has been provided regarding the importance of this vaccine. Advised may receive this vaccine at local pharmacy or Health Dept. Aware to provide a copy of the vaccination record if obtained from local pharmacy or Health Dept. Verbalized acceptance and understanding.  Flu Vaccine status: Declined, Education has been provided regarding the importance of this vaccine but patient still declined. Advised may receive this vaccine at local pharmacy or Health Dept. Aware to provide a copy of the vaccination record if obtained from local pharmacy or Health Dept. Verbalized acceptance and understanding.  Pneumococcal vaccine status: Declined,  Education has been provided regarding the importance of this vaccine but patient still declined. Advised may receive this vaccine at local pharmacy or Health Dept. Aware to  provide a copy of the vaccination record if obtained from local pharmacy or Health Dept. Verbalized acceptance and  understanding.   Covid-19 vaccine status: Completed vaccines  Qualifies for Shingles Vaccine? Yes   Zostavax completed No   Shingrix Completed?: No.    Education has been provided regarding the importance of this vaccine. Patient has been advised to call insurance company to determine out of pocket expense if they have not yet received this vaccine. Advised may also receive vaccine at local pharmacy or Health Dept. Verbalized acceptance and understanding.  Screening Tests Health Maintenance  Topic Date Due   Zoster Vaccines- Shingrix (1 of 2) Never done   COVID-19 Vaccine (2 - Janssen risk series) 09/12/2020   INFLUENZA VACCINE  02/08/2023 (Originally 06/10/2022)   Pneumonia Vaccine 51+ Years old (2 of 2 - PPSV23 or PCV20) 09/09/2023 (Originally 04/28/2014)   HPV VACCINES  Aged Out   DTaP/Tdap/Td  Discontinued    Health Maintenance  Health Maintenance Due  Topic Date Due   Zoster Vaccines- Shingrix (1 of 2) Never done   COVID-19 Vaccine (2 - Janssen risk series) 09/12/2020    Colorectal cancer screening: No longer required.   Lung Cancer Screening: (Low Dose CT Chest recommended if Age 82-80 years, 30 pack-year currently smoking OR have quit w/in 15years.) does not qualify.   Lung Cancer Screening Referral: no  Additional Screening:  Hepatitis C Screening: does not qualify; Completed yes  Vision Screening: Recommended annual ophthalmology exams for early detection of glaucoma and other disorders of the eye. Is the patient up to date with their annual eye exam?  Yes  Who is the provider or what is the name of the office in which the patient attends annual eye exams? Dr Tressia Danas If pt is not established with a provider, would they like to be referred to a provider to establish care? No .   Dental Screening: Recommended annual dental exams for proper oral hygiene  Community Resource Referral / Chronic Care Management: CRR required this visit?  No   CCM required this visit?   No      Plan:     I have personally reviewed and noted the following in the patient's chart:   Medical and social history Use of alcohol, tobacco or illicit drugs  Current medications and supplements including opioid prescriptions. Patient is not currently taking opioid prescriptions. Functional ability and status Nutritional status Physical activity Advanced directives List of other physicians Hospitalizations, surgeries, and ER visits in previous 12 months Vitals Screenings to include cognitive, depression, and falls Referrals and appointments  In addition, I have reviewed and discussed with patient certain preventive protocols, quality metrics, and best practice recommendations. A written personalized care plan for preventive services as well as general preventive health recommendations were provided to patient.     Roger Shelter, LPN   [id number]   Nurse Notes: pt doing well:he has no concerns or questions during the visit.

## 2023-05-11 NOTE — Progress Notes (Signed)
Established Patient Office Visit  Subjective:  Patient ID: [name] Foster, male    DOB: 1941/01/09  Age: 82 y.o. MRN: [id number]  CC:  No chief complaint on file.   HPI [name] Foster presents for follow up on chronic medical conditions.   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 [name] Foster does have a family history of prostate cancer in his father and brother.   Health Maintenance: -Blood work UTD -Patient continues to decline flu and pneumonia vaccines  Past Medical History:  Diagnosis Date   Arthritis    BPH (benign prostatic hyperplasia)    Former smoker, stopped smoking many years ago    History of needle biopsy of prostate with negative result 2013   Hypertension    Prostate enlargement    Prostatic intraepithelial neoplasm III 2013    Past Surgical History:  Procedure Laterality Date   CATARACT EXTRACTION W/PHACO Right 06/17/2021   Procedure: CATARACT EXTRACTION PHACO AND INTRAOCULAR LENS PLACEMENT (IOC) RIGHT;  Surgeon: Nevada Crane, MD;  Location: [facility];  Service: Ophthalmology;  Laterality: Right;  10.20 0:52.5   CATARACT EXTRACTION W/PHACO Left 08/05/2021   Procedure: CATARACT EXTRACTION PHACO AND INTRAOCULAR LENS PLACEMENT (IOC) LEFT;  Surgeon: Nevada Crane, MD;  Location: [facility];  Service: Ophthalmology;  Laterality: Left;  4.21 00:31.2    ESOPHAGOGASTRODUODENOSCOPY N/A 06/26/2019   Procedure: ESOPHAGOGASTRODUODENOSCOPY (EGD);  Surgeon: Toney Reil, MD;  Location: [facility];  Service: Gastroenterology;  Laterality: N/A;   HEMORROIDECTOMY     HERNIA REPAIR     JOINT REPLACEMENT Left    THR   PROSTATE BIOPSY  2013   TOTAL HIP ARTHROPLASTY Left    TOTAL HIP ARTHROPLASTY Right 04/26/2019   Procedure: TOTAL HIP ARTHROPLASTY ANTERIOR APPROACH;  Surgeon: Kennedy Bucker, MD;  Location: [facility];  Service: Orthopedics;  Laterality: Right;    Family History  Problem Relation Age of Onset   Prostate cancer Father    Cancer Father    Prostate cancer Brother    Cancer Mother     Social History   Socioeconomic History   Marital status: Married    Spouse name: Not on file   Number of children: 4   Years of education: Not on file   Highest education level: Not on file  Occupational History   Not on file  Tobacco Use   Smoking status: Former    Types: Cigarettes    Quit date: 06/04/1980    Years since quitting: 42.9   Smokeless tobacco: Never  Vaping Use   Vaping Use: Never used  Substance and Sexual Activity   Alcohol use: No   Drug use: No   Sexual activity: Yes  Other Topics Concern   Not on file  Social History Narrative   Not on file   Social Determinants of Health   Financial Resource Strain: Low Risk  (01/30/2023)   Overall Financial Resource Strain (CARDIA)    Difficulty of Paying Living Expenses: Not hard at all  Food Insecurity:  No Food Insecurity (01/30/2023)   Hunger Vital Sign    Worried About Running Out of Food in the Last Year: Never true    Ran Out of Food in the Last Year: Never true  Transportation Needs: No Transportation Needs (01/30/2023)   PRAPARE - Administrator, Civil Service (Medical): No    Lack of Transportation (Non-Medical): No  Physical Activity: Sufficiently Active (01/30/2023)   Exercise Vital Sign    Days of Exercise per Week: 7 days    Minutes of Exercise  per Session: 30 min  Stress: No Stress Concern Present (01/30/2023)   [employer] of Occupational Health - Occupational Stress Questionnaire    Feeling of Stress : Not at all  Social Connections: Socially Integrated (01/30/2023)   Social Connection and Isolation Panel [NHANES]    Frequency of Communication with Friends and Family: More than three times a week    Frequency of Social Gatherings with Friends and Family: Twice a week    Attends Religious Services: More than 4 times per year    Active Member of [employer] or Organizations: Yes    Attends Banker Meetings: Never    Marital Status: Married  Catering manager Violence: Not At Risk (01/30/2023)   Humiliation, Afraid, Rape, and Kick questionnaire    Fear of Current or Ex-Partner: No    Emotionally Abused: No    Physically Abused: No    Sexually Abused: No    Outpatient Medications Prior to Visit  Medication Sig Dispense Refill   HUMIRA PEN-PSOR/UVEIT STARTER 80 MG/0.8ML & 40MG /0.4ML PNKT      hydrocortisone 1 % ointment Apply 1 Application topically 2 (two) times daily. 30 g 0   Multiple Vitamin (MULTIVITAMIN WITH MINERALS) TABS tablet Take 1 tablet by mouth daily.     No facility-administered medications prior to visit.    Allergies  Allergen Reactions   Methotrexate     Other reaction(s): Weakness,     ROS Review of Systems  Constitutional:  Negative for chills and fever.  Eyes:  Negative for visual disturbance.  Respiratory:  Negative for cough, shortness of breath and wheezing.   Cardiovascular:  Negative for chest pain.  Genitourinary:  Positive for frequency. Negative for dysuria.      Objective:    Physical Exam Constitutional:      Appearance: Normal appearance.  HENT:     Head: Normocephalic and atraumatic.  Eyes:     Conjunctiva/sclera: Conjunctivae normal.  Cardiovascular:     Rate and Rhythm: Normal rate and regular rhythm.  Pulmonary:     Effort: Pulmonary effort is normal.      Breath sounds: Normal breath sounds.  Musculoskeletal:     Right lower leg: No edema.     Left lower leg: No edema.  Skin:    General: Skin is warm and dry.     Comments: Discolored rash on right lower extremity with dry skin, improved   Neurological:     General: No focal deficit present.     Mental Status: [name] Foster is alert. Mental status is at baseline.  Psychiatric:        Mood and Affect: Mood normal.        Behavior: Behavior normal.     There were no vitals taken for this visit.  There were no vitals filed for this visit.   Wt Readings from Last 3 Encounters:  01/30/23 190 lb (86.2 kg)  11/11/22 196 lb 14.4 oz (89.3 kg)  09/08/22  190 lb 1.6 oz (86.2 kg)     Health Maintenance Due  Topic Date Due   Zoster Vaccines- Shingrix (1 of 2) Never done   COVID-19 Vaccine (2 - Janssen risk series) 09/12/2020     There are no preventive care reminders to display for this patient.  Lab Results  Component Value Date   TSH 4.289 05/26/2019   Lab Results  Component Value Date   WBC 4.4 02/28/2020   HGB 9.3 (L) 02/28/2020   HCT 29.4 (L) 02/28/2020   MCV 79.9 (L) 02/28/2020   PLT 417 (H) 02/28/2020   Lab Results  Component Value Date   NA 139 05/09/2022   K 4.9 05/09/2022   CO2 28 05/09/2022   GLUCOSE 102 (H) 05/09/2022   BUN 14 05/09/2022   CREATININE 1.08 05/09/2022   BILITOT 0.6 05/09/2022   ALKPHOS 126 06/24/2019   AST 22 05/09/2022   ALT 17 05/09/2022   PROT 7.8 05/09/2022   ALBUMIN 1.7 (L) 06/24/2019   CALCIUM 9.2 05/09/2022   ANIONGAP 14 09/17/2019   EGFR 69 05/09/2022   Lab Results  Component Value Date   CHOL 163 05/09/2022   Lab Results  Component Value Date   HDL 46 05/09/2022   Lab Results  Component Value Date   LDLCALC 100 (H) 05/09/2022   Lab Results  Component Value Date   TRIG 76 05/09/2022   Lab Results  Component Value Date   CHOLHDL 3.5 05/09/2022   No results found for: "HGBA1C"    Assessment & Plan:   1. History of  elevated PSA: Recheck PSA today at patient's request.   - PSA  2. Atopic dermatitis, unspecified type: Improving, continue hydrocortisone cream as needed.  3. 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  Continue to monitor. Again discussed pneumonia vaccine which patient continues to decline.  4. Seropositive rheumatoid arthritis (HCC): Stable, still on Humira.  Following with rheumatology/ophthalmology, note reviewed from 07/31/2022.   Follow-up: No follow-ups on file.    Margarita Mail, DO

## 2023-05-12 ENCOUNTER — Ambulatory Visit (INDEPENDENT_AMBULATORY_CARE_PROVIDER_SITE_OTHER): Payer: Medicare HMO | Admitting: Internal Medicine

## 2023-05-12 ENCOUNTER — Encounter: Payer: Self-pay | Admitting: Internal Medicine

## 2023-05-12 VITALS — BP 132/80 | HR 70 | Temp 97.9°F | Resp 16 | Ht 68.0 in | Wt 191.6 lb

## 2023-05-12 DIAGNOSIS — D709 Neutropenia, unspecified: Secondary | ICD-10-CM

## 2023-05-12 DIAGNOSIS — Z139 Encounter for screening, unspecified: Secondary | ICD-10-CM | POA: Diagnosis not present

## 2023-05-12 DIAGNOSIS — S21201A Unspecified open wound of right back wall of thorax without penetration into thoracic cavity, initial encounter: Secondary | ICD-10-CM | POA: Diagnosis not present

## 2023-05-12 DIAGNOSIS — Z87898 Personal history of other specified conditions: Secondary | ICD-10-CM

## 2023-05-12 DIAGNOSIS — W3400XA Accidental discharge from unspecified firearms or gun, initial encounter: Secondary | ICD-10-CM

## 2023-05-12 DIAGNOSIS — N401 Enlarged prostate with lower urinary tract symptoms: Secondary | ICD-10-CM | POA: Diagnosis not present

## 2023-05-12 DIAGNOSIS — Z13220 Encounter for screening for lipoid disorders: Secondary | ICD-10-CM

## 2023-05-12 DIAGNOSIS — J431 Panlobular emphysema: Secondary | ICD-10-CM

## 2023-05-12 DIAGNOSIS — H9190 Unspecified hearing loss, unspecified ear: Secondary | ICD-10-CM

## 2023-05-13 ENCOUNTER — Telehealth: Payer: Self-pay | Admitting: Internal Medicine

## 2023-05-13 ENCOUNTER — Ambulatory Visit: Payer: Self-pay | Admitting: *Deleted

## 2023-05-13 NOTE — Telephone Encounter (Signed)
Called received from [place] from [employer] with critical value : collected on 05/12/23 at 9:03 - absolute neutrophils low at 489 from repeat analysis. And WBC 3.0 from 05/12/23 collection.

## 2023-05-13 NOTE — Telephone Encounter (Signed)
Scott with [employer] is calling in to give some lab results on the pt. Please follow up with Scott.  Ref#: [id number]

## 2023-05-13 NOTE — Telephone Encounter (Signed)
Already received labs.

## 2023-05-13 NOTE — Addendum Note (Signed)
Addended by: Margarita Mail on: 05/13/2023 10:31 AM   Modules accepted: Orders

## 2023-05-15 MED ORDER — DOXYCYCLINE HYCLATE 100 MG PO TABS: 100.0000 mg | ORAL_TABLET | Freq: Two times a day (BID) | ORAL | 0 refills | Status: AC

## 2023-05-15 NOTE — Addendum Note (Signed)
Addended by: Margarita Mail on: 05/15/2023 07:51 AM   Modules accepted: Orders

## 2023-05-16 LAB — CBC WITH DIFFERENTIAL/PLATELET
Absolute Monocytes: 261 cells/uL (ref 200–950)
Basophils Absolute: 30 cells/uL (ref 0–200)
Basophils Relative: 1 %
Eosinophils Absolute: 129 cells/uL (ref 15–500)
Eosinophils Relative: 4.3 %
HCT: 39.9 % (ref 38.5–50.0)
Hemoglobin: 13.6 g/dL (ref 13.2–17.1)
Lymphs Abs: 2091 cells/uL (ref 850–3900)
MCH: 30.8 pg (ref 27.0–33.0)
MCHC: 34.1 g/dL (ref 32.0–36.0)
MCV: 90.5 fL (ref 80.0–100.0)
MPV: 10.4 fL (ref 7.5–12.5)
Monocytes Relative: 8.7 %
Neutro Abs: 489 cells/uL — CL (ref 1500–7800)
Neutrophils Relative %: 16.3 %
Platelets: 258 10*3/uL (ref 140–400)
RBC: 4.41 10*6/uL (ref 4.20–5.80)
RDW: 13.1 % (ref 11.0–15.0)
Total Lymphocyte: 69.7 %
WBC: 3 10*3/uL — ABNORMAL LOW (ref 3.8–10.8)

## 2023-05-16 LAB — COMPLETE METABOLIC PANEL WITH GFR
AG Ratio: 1.2 (calc) (ref 1.0–2.5)
ALT: 19 U/L (ref 9–46)
AST: 22 U/L (ref 10–35)
Albumin: 4.2 g/dL (ref 3.6–5.1)
Alkaline phosphatase (APISO): 97 U/L (ref 35–144)
BUN: 10 mg/dL (ref 7–25)
CO2: 30 mmol/L (ref 20–32)
Calcium: 9.6 mg/dL (ref 8.6–10.3)
Chloride: 102 mmol/L (ref 98–110)
Creat: 0.87 mg/dL (ref 0.70–1.22)
Globulin: 3.6 g/dL (calc) (ref 1.9–3.7)
Glucose, Bld: 98 mg/dL (ref 65–99)
Potassium: 4.5 mmol/L (ref 3.5–5.3)
Sodium: 139 mmol/L (ref 135–146)
Total Bilirubin: 0.5 mg/dL (ref 0.2–1.2)
Total Protein: 7.8 g/dL (ref 6.1–8.1)
eGFR: 86 mL/min/{1.73_m2} (ref >=60)

## 2023-05-16 LAB — WOUND CULTURE
MICRO NUMBER:: 15156121
SPECIMEN QUALITY:: ADEQUATE

## 2023-05-16 LAB — PSA: PSA: 4.26 ng/mL — ABNORMAL HIGH (ref <=4.00)

## 2023-05-16 LAB — LIPID PANEL
Cholesterol: 158 mg/dL (ref <200)
HDL: 49 mg/dL (ref >=40)
LDL Cholesterol (Calc): 94 mg/dL (calc)
Non-HDL Cholesterol (Calc): 109 mg/dL (calc) (ref <130)
Total CHOL/HDL Ratio: 3.2 (calc) (ref <5.0)
Triglycerides: 67 mg/dL (ref <150)

## 2023-07-22 ENCOUNTER — Encounter: Payer: Self-pay | Admitting: Oncology

## 2023-07-29 ENCOUNTER — Ambulatory Visit: Payer: Medicare HMO | Admitting: Dermatology

## 2023-08-06 DIAGNOSIS — H35052 Retinal neovascularization, unspecified, left eye: Secondary | ICD-10-CM | POA: Diagnosis not present

## 2023-08-06 DIAGNOSIS — M0579 Rheumatoid arthritis with rheumatoid factor of multiple sites without organ or systems involvement: Secondary | ICD-10-CM | POA: Diagnosis not present

## 2023-08-06 DIAGNOSIS — H15033 Posterior scleritis, bilateral: Secondary | ICD-10-CM | POA: Diagnosis not present

## 2023-08-06 DIAGNOSIS — H35363 Drusen (degenerative) of macula, bilateral: Secondary | ICD-10-CM | POA: Diagnosis not present

## 2023-08-06 DIAGNOSIS — D709 Neutropenia, unspecified: Secondary | ICD-10-CM | POA: Diagnosis not present

## 2023-08-06 DIAGNOSIS — H43813 Vitreous degeneration, bilateral: Secondary | ICD-10-CM | POA: Diagnosis not present

## 2023-08-06 DIAGNOSIS — Z79899 Other long term (current) drug therapy: Secondary | ICD-10-CM | POA: Diagnosis not present

## 2023-09-25 DIAGNOSIS — H318 Other specified disorders of choroid: Secondary | ICD-10-CM | POA: Diagnosis not present

## 2023-09-25 DIAGNOSIS — H43813 Vitreous degeneration, bilateral: Secondary | ICD-10-CM | POA: Diagnosis not present

## 2023-09-25 DIAGNOSIS — Z961 Presence of intraocular lens: Secondary | ICD-10-CM | POA: Diagnosis not present

## 2023-11-13 ENCOUNTER — Other Ambulatory Visit: Payer: Self-pay

## 2023-11-13 ENCOUNTER — Ambulatory Visit: Payer: Medicare HMO | Admitting: Internal Medicine

## 2023-11-13 VITALS — BP 118/76 | HR 81 | Temp 97.7°F | Resp 16 | Ht 68.0 in | Wt 191.3 lb

## 2023-11-13 DIAGNOSIS — J431 Panlobular emphysema: Secondary | ICD-10-CM | POA: Diagnosis not present

## 2023-11-13 DIAGNOSIS — R351 Nocturia: Secondary | ICD-10-CM | POA: Diagnosis not present

## 2023-11-13 DIAGNOSIS — Z87898 Personal history of other specified conditions: Secondary | ICD-10-CM

## 2023-11-13 DIAGNOSIS — D709 Neutropenia, unspecified: Secondary | ICD-10-CM

## 2023-11-13 DIAGNOSIS — Z8042 Family history of malignant neoplasm of prostate: Secondary | ICD-10-CM | POA: Diagnosis not present

## 2023-11-13 DIAGNOSIS — M059 Rheumatoid arthritis with rheumatoid factor, unspecified: Secondary | ICD-10-CM

## 2023-11-13 NOTE — Progress Notes (Signed)
 Established Patient Office Visit  Subjective   Patient ID: [name] Foster, male    DOB: Aug 03, 1941  Age: 83 y.o. MRN: [id number]  Chief Complaint  Patient presents with   Medical Management of Chronic Issues    6 month recheck    HPI  [name] presents for follow up on chronic medical conditions. He is here with his wife today. Overall he is doing well and has no questions or concerns.   At our LOV, he had a large boil on his back that ended up growing MRSA. This was treated but at the time his labs showing neutropenia and WBC of 3.0 which was thought to be due to his current infection. This has completely resolved and he is doing well today.   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 He does have a family history of prostate cancer in his father and brother.   Health Maintenance: -Blood work due -Patient continues to decline flu and pneumonia vaccines  Patient Active Problem List   Diagnosis Date Noted   History of elevated PSA 11/13/2023   Nocturia 11/13/2023   Family history of prostate cancer 11/13/2023   Olecranon bursitis of both elbows 04/11/2020   Tinea 04/11/2020   Episcleritis of both eyes 02/28/2020   History of malnutrition 02/28/2020   History of depression 02/28/2020   Protein-calorie malnutrition, severe 06/26/2019   Normocytic anemia 06/24/2019   Thrombocytosis 06/24/2019   Dysphagia 06/24/2019   Palliative care by specialist 06/02/2019   Iron deficiency anemia 06/02/2019    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   Generalized weakness 06/02/2019   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   Pneumonia 05/25/2019   Sepsis (HCC)    Goals of care, counseling/discussion    DNR (do not resuscitate) discussion    Atypical pneumonia    H/O bilateral hip replacements 04/26/2019   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   Incomplete emptying of bladder 02/03/2017   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   Abnormal finding on chest xray 07/18/2013   Chronic cough 07/18/2013   Tests ordered 09/03/2012   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   Benign localized prostatic hyperplasia with lower urinary tract symptoms (LUTS) 07/07/2012   Elevated prostate specific antigen (PSA) 07/07/2012   Past Medical History:  Diagnosis Date   Arthritis    BPH (benign prostatic hyperplasia)    Former smoker, stopped smoking many years ago    History of needle biopsy of prostate with negative result 2013   Hypertension    Prostate enlargement    Prostatic intraepithelial neoplasm III 2013   Past Surgical History:  Procedure Laterality Date   CATARACT EXTRACTION W/PHACO Right 06/17/2021   Procedure: CATARACT EXTRACTION PHACO AND INTRAOCULAR LENS PLACEMENT (IOC) RIGHT;  Surgeon: Myrna Adine Anes, MD;  Location: [facility];  Service: Ophthalmology;  Laterality: Right;  10.20 0:52.5   CATARACT EXTRACTION W/PHACO Left 08/05/2021   Procedure: CATARACT EXTRACTION PHACO AND INTRAOCULAR LENS PLACEMENT (IOC) LEFT;  Surgeon: Myrna Adine Anes, MD;  Location: [facility];  Service: Ophthalmology;  Laterality:  Left;  4.21 00:31.2   ESOPHAGOGASTRODUODENOSCOPY N/A 06/26/2019   Procedure: ESOPHAGOGASTRODUODENOSCOPY (EGD);  Surgeon: Unk Corinn Skiff, MD;  Location: [facility];  Service: Gastroenterology;  Laterality: N/A;   HEMORROIDECTOMY     HERNIA REPAIR     JOINT REPLACEMENT  Left    THR   PROSTATE BIOPSY  2013   TOTAL HIP ARTHROPLASTY Left    TOTAL HIP ARTHROPLASTY Right 04/26/2019   Procedure: TOTAL HIP ARTHROPLASTY ANTERIOR APPROACH;  Surgeon: Kathlynn Sharper, MD;  Location: [facility];  Service: Orthopedics;  Laterality: Right;   Social History   Tobacco Use   Smoking status: Former    Current packs/day: 0.00    Types: Cigarettes    Quit date: 06/04/1980    Years since quitting: 43.4   Smokeless tobacco: Never  Vaping Use   Vaping status: Never Used  Substance Use Topics   Alcohol use: No   Drug use: No   Social History   Socioeconomic History   Marital status: Married    Spouse name: Not on file   Number of children: 4   Years of education: Not on file   Highest education level: Not on file  Occupational History   Not on file  Tobacco Use   Smoking status: Former    Current packs/day: 0.00    Types: Cigarettes    Quit date: 06/04/1980    Years since quitting: 43.4   Smokeless tobacco: Never  Vaping Use   Vaping status: Never Used  Substance and Sexual Activity   Alcohol use: No   Drug use: No   Sexual activity: Yes  Other Topics Concern   Not on file  Social History Narrative   Not on file   Social Drivers of Health   Financial Resource Strain: Low Risk  (01/30/2023)   Overall Financial Resource Strain (CARDIA)    Difficulty of Paying Living Expenses: Not hard at all  Food Insecurity: No Food Insecurity (01/30/2023)   Hunger Vital Sign    Worried About Running Out of Food in the Last Year: Never true    Ran Out of Food in the Last Year: Never true  Transportation Needs: No Transportation Needs (01/30/2023)   PRAPARE - Administrator, Civil Service (Medical): No    Lack of Transportation (Non-Medical): No  Physical Activity: Sufficiently Active (01/30/2023)   Exercise Vital Sign    Days of Exercise per Week: 7 days    Minutes of Exercise per Session: 30 min  Stress: No Stress Concern Present (01/30/2023)   [employer]ational [employer] - Occupational Stress Questionnaire    Feeling of Stress : Not at all  Social Connections: Socially Integrated (01/30/2023)   Social Connection and Isolation Panel [NHANES]    Frequency of Communication with Friends and Family: More than three times a week    Frequency of Social Gatherings with Friends and Family: Twice a week    Attends Religious Services: More than 4 times per year    Active Member of [employer] or Organizations: Yes    Attends Banker Meetings: Never    Marital Status: Married  Catering Manager Violence: Not At Risk (01/30/2023)   Humiliation, Afraid, Rape, and Kick questionnaire    Fear of Current or Ex-Partner: No    Emotionally Abused: No    Physically Abused: No    Sexually Abused: No   Family Status  Relation Name Status   Father  Deceased   MGM  Alive  Brother  (Not Specified)   Mother  Deceased  No partnership data on file   Family History  Problem Relation Age of Onset   Prostate cancer Father    Cancer Father    Prostate cancer Brother    Cancer Mother    Allergies  Allergen Reactions   Methotrexate      Other reaction(s): Weakness,       Review of Systems  All other systems reviewed and are negative.     Objective:     BP 118/76   Pulse 81   Temp 97.7 F (36.5 C) (Oral)   Resp 16   Ht 5' 8 (1.727 m)   Wt 191 lb 4.8 oz (86.8 kg)   SpO2 99%   BMI 29.09 kg/m  BP Readings from Last 3 Encounters:  11/13/23 118/76  05/12/23 132/80  11/11/22 125/72   Wt Readings from Last 3 Encounters:  11/13/23 191 lb 4.8 oz (86.8 kg)  05/12/23 191 lb 9.6 oz (86.9 kg)  01/30/23 190 lb (86.2 kg)      Physical Exam Constitutional:      Appearance: Normal appearance.  HENT:     Head: Normocephalic and atraumatic.     Mouth/Throat:     Mouth: Mucous membranes are moist.     Pharynx: Oropharynx is clear.  Eyes:     Comments: Cataracts bilaterally   Neck:     Comments: No thyromegaly   Cardiovascular:     Rate and Rhythm: Normal rate and regular rhythm.  Pulmonary:     Effort: Pulmonary effort is normal.     Breath sounds: Normal breath sounds.  Musculoskeletal:     Cervical back: No tenderness.  Lymphadenopathy:     Cervical: No cervical adenopathy.  Skin:    General: Skin is warm and dry.  Neurological:     General: No focal deficit present.     Mental Status: He is alert. Mental status is at baseline.  Psychiatric:        Mood and Affect: Mood normal.        Behavior: Behavior normal.      No results found for any visits on 11/13/23.  Last CBC Lab Results  Component Value Date   WBC 3.0 (L) 05/12/2023   HGB 13.6 05/12/2023   HCT 39.9 05/12/2023   MCV 90.5 05/12/2023   MCH 30.8 05/12/2023   RDW 13.1 05/12/2023   PLT 258 05/12/2023   Last metabolic panel Lab Results  Component Value Date   GLUCOSE 98 05/12/2023   NA 139 05/12/2023   K 4.5 05/12/2023   CL 102 05/12/2023   CO2 30 05/12/2023   BUN 10 05/12/2023   CREATININE 0.87 05/12/2023   EGFR 86 05/12/2023   CALCIUM 9.6 05/12/2023   PHOS 2.8 06/29/2019   PROT 7.8 05/12/2023   ALBUMIN 1.7 (L) 06/24/2019   BILITOT 0.5 05/12/2023   ALKPHOS 126 06/24/2019   AST 22 05/12/2023   ALT 19 05/12/2023   ANIONGAP 14 09/17/2019   Last lipids Lab Results  Component Value Date   CHOL 158 05/12/2023   HDL 49 05/12/2023   LDLCALC 94 05/12/2023   TRIG 67 05/12/2023   CHOLHDL 3.2 05/12/2023   Last hemoglobin A1c No results found for: HGBA1C Last thyroid  functions Lab Results  Component Value Date   TSH 4.289 05/26/2019   Last vitamin D No results found for: 25OHVITD2, 25OHVITD3, VD25OH Last vitamin B12 and Folate Lab Results  Component Value Date   VITAMINB12  605 06/04/2017   FOLATE 34.0 01/28/2018      The ASCVD Risk score (Arnett DK, et al., 2019) failed to calculate for the following reasons:   The 2019 ASCVD risk score is only valid for ages 65 to 51    Assessment &  Plan:  History of elevated PSA Assessment & Plan: Recheck PSA today.   Orders: -     PSA  Nocturia Assessment & Plan: Recheck PSA.  Orders: -     PSA  Family history of prostate cancer Assessment & Plan: In father and brother.  Orders: -     PSA  Neutropenia, unspecified type (HCC) -     CBC with Differential/Platelet  Seropositive rheumatoid arthritis (HCC) Assessment & Plan: On Humira, doing well. Following with Rheumatology.    Panlobular emphysema (HCC) Assessment & Plan: Stable, no symptoms, not on medications. Continue to decline flu and pneumonia vaccines.    Recheck CBC, low WBC and neutrophils due to abscess happening at the time but will recheck today to ensure resolution.   Return in about 6 months (around 05/12/2024).    Sharyle Fischer, DO

## 2023-11-13 NOTE — Assessment & Plan Note (Signed)
 In father and brother.

## 2023-11-13 NOTE — Assessment & Plan Note (Signed)
 Recheck PSA today

## 2023-11-13 NOTE — Assessment & Plan Note (Signed)
 Stable, no symptoms, not on medications. Continue to decline flu and pneumonia vaccines.

## 2023-11-13 NOTE — Assessment & Plan Note (Signed)
 On Humira, doing well. Following with Rheumatology.

## 2023-11-13 NOTE — Assessment & Plan Note (Signed)
 Recheck PSA

## 2023-11-14 LAB — CBC WITH DIFFERENTIAL/PLATELET
Absolute Lymphocytes: 1987 {cells}/uL (ref 850–3900)
Absolute Monocytes: 393 {cells}/uL (ref 200–950)
Basophils Absolute: 40 {cells}/uL (ref 0–200)
Basophils Relative: 1.2 %
Eosinophils Absolute: 142 {cells}/uL (ref 15–500)
Eosinophils Relative: 4.3 %
HCT: 39.7 % (ref 38.5–50.0)
Hemoglobin: 13.5 g/dL (ref 13.2–17.1)
MCH: 31.2 pg (ref 27.0–33.0)
MCHC: 34 g/dL (ref 32.0–36.0)
MCV: 91.7 fL (ref 80.0–100.0)
MPV: 10.3 fL (ref 7.5–12.5)
Monocytes Relative: 11.9 %
Neutro Abs: 739 {cells}/uL — ABNORMAL LOW (ref 1500–7800)
Neutrophils Relative %: 22.4 %
Platelets: 287 10*3/uL (ref 140–400)
RBC: 4.33 10*6/uL (ref 4.20–5.80)
RDW: 13.1 % (ref 11.0–15.0)
Total Lymphocyte: 60.2 %
WBC: 3.3 10*3/uL — ABNORMAL LOW (ref 3.8–10.8)

## 2023-11-14 LAB — PSA: PSA: 4.82 ng/mL — ABNORMAL HIGH (ref <=4.00)

## 2023-11-16 ENCOUNTER — Telehealth: Payer: Self-pay | Admitting: Internal Medicine

## 2023-11-16 NOTE — Telephone Encounter (Signed)
 Spoke to patient, she stated she has appointment with Dr.Sowles

## 2023-11-16 NOTE — Telephone Encounter (Signed)
 Pts wife is calling to report received a message to schedule an appt to return. Unsure way, read in MyChart that PSA was elevated. Not resulted. Please advise  CB-  [phone]

## 2023-11-17 NOTE — Addendum Note (Signed)
 Addended by: Margarita Mail on: 11/17/2023 08:55 AM   Modules accepted: Orders

## 2023-12-04 ENCOUNTER — Other Ambulatory Visit: Payer: Self-pay | Admitting: Internal Medicine

## 2023-12-04 ENCOUNTER — Telehealth: Payer: Self-pay | Admitting: Family Medicine

## 2023-12-04 DIAGNOSIS — D709 Neutropenia, unspecified: Secondary | ICD-10-CM

## 2023-12-04 LAB — CBC WITH DIFFERENTIAL/PLATELET
Absolute Lymphocytes: 1651 {cells}/uL (ref 850–3900)
Absolute Monocytes: 458 {cells}/uL (ref 200–950)
Basophils Absolute: 31 {cells}/uL (ref 0–200)
Basophils Relative: 1.2 %
Eosinophils Absolute: 143 {cells}/uL (ref 15–500)
Eosinophils Relative: 5.5 %
HCT: 39.2 % (ref 38.5–50.0)
Hemoglobin: 12.9 g/dL — ABNORMAL LOW (ref 13.2–17.1)
MCH: 30 pg (ref 27.0–33.0)
MCHC: 32.9 g/dL (ref 32.0–36.0)
MCV: 91.2 fL (ref 80.0–100.0)
MPV: 10.3 fL (ref 7.5–12.5)
Monocytes Relative: 17.6 %
Neutro Abs: 317 {cells}/uL — CL (ref 1500–7800)
Neutrophils Relative %: 12.2 %
Platelets: 261 10*3/uL (ref 140–400)
RBC: 4.3 10*6/uL (ref 4.20–5.80)
RDW: 13.1 % (ref 11.0–15.0)
Total Lymphocyte: 63.5 %
WBC: 2.6 10*3/uL — ABNORMAL LOW (ref 3.8–10.8)

## 2023-12-04 NOTE — Telephone Encounter (Signed)
Received phone call from Teresa Coombs at the physician call line, who informed me the patient's neutrophil count returned at a critical value. On record review, the patient's neutrophil count has been trending in the lower ranges since July 2024.  Subsequently called patient who denied any symptoms of concern.  He states his wound he was treated for previously has resolved and he feels well.  Cautioned him to use a mask when around people and protect himself against infection, advising him that this places him at significant risk for infection.    Advised him to have a very low threshold to proceed to the emergency department for evaluation if he begins to feel unwell.  Also advised him to follow-up with his PCP on Monday and that I would be forwarding this note to her.  The source of his neutropenia is unclear at this point and may be related to his rheumatoid arthritis (?effect of humira) versus questionable recurrence of his prostate cancer versus alternative causes. Given ongoing nature of the problem and patient's current well-status, will defer further work-up to PCP.

## 2023-12-07 NOTE — Addendum Note (Signed)
Addended by: Margarita Mail on: 12/07/2023 08:01 AM   Modules accepted: Orders

## 2023-12-11 ENCOUNTER — Encounter: Payer: Self-pay | Admitting: Oncology

## 2023-12-11 ENCOUNTER — Inpatient Hospital Stay: Payer: Medicare HMO | Attending: Oncology | Admitting: Oncology

## 2023-12-11 ENCOUNTER — Inpatient Hospital Stay: Payer: Medicare HMO

## 2023-12-11 VITALS — BP 147/81 | HR 68 | Temp 96.2°F | Resp 18 | Wt 192.3 lb

## 2023-12-11 DIAGNOSIS — Z87891 Personal history of nicotine dependence: Secondary | ICD-10-CM | POA: Diagnosis not present

## 2023-12-11 DIAGNOSIS — Z809 Family history of malignant neoplasm, unspecified: Secondary | ICD-10-CM | POA: Insufficient documentation

## 2023-12-11 DIAGNOSIS — D709 Neutropenia, unspecified: Secondary | ICD-10-CM

## 2023-12-11 DIAGNOSIS — Z8042 Family history of malignant neoplasm of prostate: Secondary | ICD-10-CM | POA: Diagnosis not present

## 2023-12-11 LAB — CMP (CANCER CENTER ONLY)
ALT: 23 U/L (ref 0–44)
AST: 28 U/L (ref 15–41)
Albumin: 4 g/dL (ref 3.5–5.0)
Alkaline Phosphatase: 83 U/L (ref 38–126)
Anion gap: 7 (ref 5–15)
BUN: 17 mg/dL (ref 8–23)
CO2: 27 mmol/L (ref 22–32)
Calcium: 9 mg/dL (ref 8.9–10.3)
Chloride: 101 mmol/L (ref 98–111)
Creatinine: 0.87 mg/dL (ref 0.61–1.24)
GFR, Estimated: >60 mL/min (ref >60)
Glucose, Bld: 85 mg/dL (ref 70–99)
Potassium: 4.2 mmol/L (ref 3.5–5.1)
Sodium: 135 mmol/L (ref 135–145)
Total Bilirubin: 0.7 mg/dL (ref 0.0–1.2)
Total Protein: 7.9 g/dL (ref 6.5–8.1)

## 2023-12-11 LAB — CBC WITH DIFFERENTIAL/PLATELET
Abs Immature Granulocytes: 0 10*3/uL (ref 0.00–0.07)
Basophils Absolute: 0 10*3/uL (ref 0.0–0.1)
Basophils Relative: 1 %
Eosinophils Absolute: 0.2 10*3/uL (ref 0.0–0.5)
Eosinophils Relative: 5 %
HCT: 37.9 % — ABNORMAL LOW (ref 39.0–52.0)
Hemoglobin: 12.9 g/dL — ABNORMAL LOW (ref 13.0–17.0)
Immature Granulocytes: 0 %
Lymphocytes Relative: 60 %
Lymphs Abs: 1.9 10*3/uL (ref 0.7–4.0)
MCH: 31 pg (ref 26.0–34.0)
MCHC: 34 g/dL (ref 30.0–36.0)
MCV: 91.1 fL (ref 80.0–100.0)
Monocytes Absolute: 0.4 10*3/uL (ref 0.1–1.0)
Monocytes Relative: 12 %
Neutro Abs: 0.7 10*3/uL — ABNORMAL LOW (ref 1.7–7.7)
Neutrophils Relative %: 22 %
Platelets: 258 10*3/uL (ref 150–400)
RBC: 4.16 MIL/uL — ABNORMAL LOW (ref 4.22–5.81)
RDW: 13.3 % (ref 11.5–15.5)
WBC: 3.2 10*3/uL — ABNORMAL LOW (ref 4.0–10.5)
nRBC: 0 % (ref 0.0–0.2)

## 2023-12-11 LAB — HIV ANTIBODY (ROUTINE TESTING W REFLEX): HIV Screen 4th Generation wRfx: NONREACTIVE

## 2023-12-11 LAB — TECHNOLOGIST SMEAR REVIEW: Plt Morphology: ADEQUATE

## 2023-12-11 LAB — HEPATITIS PANEL, ACUTE
HCV Ab: NONREACTIVE
Hep A IgM: NONREACTIVE
Hep B C IgM: NONREACTIVE
Hepatitis B Surface Ag: NONREACTIVE

## 2023-12-11 LAB — LACTATE DEHYDROGENASE: LDH: 156 U/L (ref 98–192)

## 2023-12-11 LAB — FOLATE: Folate: >40 ng/mL (ref >5.9)

## 2023-12-11 LAB — VITAMIN B12: Vitamin B-12: 462 pg/mL (ref 180–914)

## 2023-12-11 NOTE — Progress Notes (Signed)
Hematology/Oncology Consult Note [phone] [phone]      Patient Care Team: Margarita Mail, DO as PCP - General (Internal Medicine) Vashti Hey, MD (Ophthalmology) Sherren Mocha Herbie Drape, NP as Nurse Practitioner (Rheumatology) Rickard Patience, MD as Consulting Physician (Oncology)  REASON FOR VISIT Re - establish care for neutropenia.   ASSESSMENT & PLAN:   Neutropenia (HCC) History of chronic neutropenia with baseline ANC around 1.  Recent blood work showed decreased ANC in 0.3-0.7 range.  I discussed with patient that the differential diagnosis of leukopenia is broad, including acute or chronic infection, inflammation, nutrition deficiency, autoimmune disease,  ethnic, or malignant etiology including underlying bone morrow disorders.  For the work up of patient's leukoepenia, I recommend checking CBC;CMP, LDH; smear review, folate, Vitamin B12, hepatitis, HIV, flowcytometry and monoclonal gammopathy workup.   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  Humira may cause agranulocytosis. Recommend patient to contact rhematology to consider temporally hold off treatment or decrease dosage while awaiting work up  Orders Placed This Encounter  Procedures   Folate    Standing Status:   Future    Number of Occurrences:   1    Expected Date:   12/11/2023    Expiration Date:   12/10/2024   Vitamin B12    Standing Status:   Future    Number of Occurrences:   1    Expected Date:   12/11/2023    Expiration Date:   12/10/2024   CBC with Differential/Platelet    Standing Status:   Future    Number of Occurrences:   1    Expected Date:   12/11/2023    Expiration Date:   12/10/2024   Multiple Myeloma Panel (SPEP&IFE w/QIG)    Standing Status:   Future    Number of Occurrences:   1    Expected Date:   12/11/2023    Expiration Date:   12/10/2024   Kappa/lambda light chains    Standing Status:   Future    Number of Occurrences:   1    Expected Date:   12/11/2023    Expiration  Date:   12/10/2024   Flow cytometry panel-leukemia/lymphoma work-up    Standing Status:   Future    Number of Occurrences:   1    Expected Date:   12/11/2023    Expiration Date:   12/10/2024   Lactate dehydrogenase    Standing Status:   Future    Number of Occurrences:   1    Expected Date:   12/11/2023    Expiration Date:   12/10/2024   HIV Antibody (routine testing w rflx)    Standing Status:   Future    Number of Occurrences:   1    Expected Date:   12/11/2023    Expiration Date:   12/10/2024   Hepatitis panel, acute    Standing Status:   Future    Number of Occurrences:   1    Expected Date:   12/11/2023    Expiration Date:   12/10/2024   CMP (Cancer Center only)    Standing Status:   Future    Number of Occurrences:   1    Expiration Date:   12/10/2024   Technologist smear review    Standing Status:   Future    Number of Occurrences:   1    Expiration Date:   12/10/2024    Clinical information::   neutropenia   Follow up tbd All questions were answered. The  patient knows to call the clinic with any problems, questions or concerns.  Rickard Patience, MD, PhD [facility] Hematology Oncology 12/11/2023    HISTORY OF PRESENTING ILLNESS: [name] Foster 83 y.o. 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  Patient was accompanied by his wife today. Patient denies any complaint he fails at his baseline health. Denies any night sweats or fever or weight loss. Denies any chest pain, shortness of breath, abdominal pain, or leg swelling. He denies any frequent upper respiratory infections. Patient had some recent labs done with [facility].  he has left shoulder pain for which he had seen PCP and was recommended to see orthopedic surgeon. patient declined. He was then advised to start shoulder exercise and be reassessed.  05/14/2017, hemoglobin 12.9 WBC 2.8 MCV 89.5 platelet 207. ANC 0.9Creatinine 1 bilirubin 0.6 AST 25 ALT 27,  alkaline phosphatase In the past,his wbc has been chronically low, ranges from 12.9-13.3. ANC ranges from 0.9-1.9.  # Work up negative including pathology smear review, ANA, HIV, TSH, B12,  # his neutrophil counts was monitored weekly for the past few weeks. Patient's ANC ranges from 1.0-1.4. He has no signs of frequent infection. 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  Patient with above history reviewed by me today presents for re-establish care for neutropenia.  Patient was last seen by me on 07/19/2019.  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  He has had bone marrow biopsy done in August 2020 Trilineage hematopoiesis, hypercellular bone marrow for his age.  Polyclonal plasmacytosis likely reactive. There is no evidence of lymphoproliferative process.  Overall changes are generally nonspecific and may be related to patient's known history of rheumatoid arthritis.  Abundant iron stores. Jak 2 mutation analysis with reflex to CARL,MPL Jak exon 12-15 are negative.  He presents to re-establish care due to neutropenia.  Recent blood work was reviewed.  05/12/2023 wbc 3 , ANC 0.489 11/13/2023 wbc 3.3 ANC 0.739 12/04/2023 wbc 2.6, ANC 0.317  He denies any recurrent infection, recent antibiotics. He does not drink alcohol. No use of otc herbal supplementation.  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 Positive neutrophil antibodies.   Review of Systems  Constitutional:  Negative for appetite change, chills, diaphoresis, fatigue, fever and unexpected weight change.  HENT:   Negative for hearing loss, lump/mass, nosebleeds and sore throat.   Eyes:  Negative for eye problems and icterus.  Respiratory:  Negative for chest tightness, cough, hemoptysis, shortness of breath and wheezing.   Cardiovascular:  Negative for chest pain and leg swelling.   Gastrointestinal:  Negative for abdominal distention, abdominal pain, blood in stool, diarrhea, nausea and rectal pain.  Endocrine: Negative for hot flashes.  Genitourinary:  Negative for bladder incontinence, difficulty urinating, dysuria, frequency, hematuria and nocturia.   Musculoskeletal:  Negative for arthralgias, back pain, flank pain, gait problem and myalgias.  Skin:  Negative for rash.  Neurological:  Negative for dizziness, gait problem, headaches, numbness and seizures.  Hematological:  Negative for adenopathy. Does not bruise/bleed easily.  Psychiatric/Behavioral:  Negative for confusion and decreased concentration. The patient is not nervous/anxious.     MEDICAL HISTORY: Past Medical History:  Diagnosis Date   Arthritis    BPH (benign prostatic hyperplasia)    Former smoker, stopped smoking many years ago  History of needle biopsy of prostate with negative result 2013   Hypertension    Prostate enlargement    Prostatic intraepithelial neoplasm III 2013    SURGICAL HISTORY: Past Surgical History:  Procedure Laterality Date   CATARACT EXTRACTION W/PHACO Right 06/17/2021   Procedure: CATARACT EXTRACTION PHACO AND INTRAOCULAR LENS PLACEMENT (IOC) RIGHT;  Surgeon: Nevada Crane, MD;  Location: [facility];  Service: Ophthalmology;  Laterality: Right;  10.20 0:52.5   CATARACT EXTRACTION W/PHACO Left 08/05/2021   Procedure: CATARACT EXTRACTION PHACO AND INTRAOCULAR LENS PLACEMENT (IOC) LEFT;  Surgeon: Nevada Crane, MD;  Location: [facility];  Service: Ophthalmology;  Laterality: Left;  4.21 00:31.2   ESOPHAGOGASTRODUODENOSCOPY N/A 06/26/2019   Procedure: ESOPHAGOGASTRODUODENOSCOPY (EGD);  Surgeon: Toney Reil, MD;  Location: [facility];  Service: Gastroenterology;  Laterality: N/A;   HEMORROIDECTOMY     HERNIA REPAIR     JOINT REPLACEMENT Left    THR   PROSTATE BIOPSY  2013   TOTAL HIP ARTHROPLASTY Left    TOTAL HIP ARTHROPLASTY  Right 04/26/2019   Procedure: TOTAL HIP ARTHROPLASTY ANTERIOR APPROACH;  Surgeon: Kennedy Bucker, MD;  Location: [facility];  Service: Orthopedics;  Laterality: Right;    SOCIAL HISTORY: Social History   Socioeconomic History   Marital status: Married    Spouse name: Not on file   Number of children: 4   Years of education: Not on file   Highest education level: Not on file  Occupational History   Not on file  Tobacco Use   Smoking status: Former    Current packs/day: 0.00    Types: Cigarettes    Quit date: 06/04/1980    Years since quitting: 43.5   Smokeless tobacco: Never  Vaping Use   Vaping status: Never Used  Substance and Sexual Activity   Alcohol use: No   Drug use: No   Sexual activity: Yes  Other Topics Concern   Not on file  Social History Narrative   Not on file   Social Drivers of Health   Financial Resource Strain: Low Risk  (01/30/2023)   Overall Financial Resource Strain (CARDIA)    Difficulty of Paying Living Expenses: Not hard at all  Food Insecurity: No Food Insecurity (01/30/2023)   Hunger Vital Sign    Worried About Running Out of Food in the Last Year: Never true    Ran Out of Food in the Last Year: Never true  Transportation Needs: No Transportation Needs (01/30/2023)   PRAPARE - Administrator, Civil Service (Medical): No    Lack of Transportation (Non-Medical): No  Physical Activity: Sufficiently Active (01/30/2023)   Exercise Vital Sign    Days of Exercise per Week: 7 days    Minutes of Exercise per Session: 30 min  Stress: No Stress Concern Present (01/30/2023)   [employer] of Occupational Health - Occupational Stress Questionnaire    Feeling of Stress : Not at all  Social Connections: Socially Integrated (01/30/2023)   Social Connection and Isolation Panel [NHANES]    Frequency of Communication with Friends and Family: More than three times a week    Frequency of Social Gatherings with Friends and Family: Twice a week     Attends Religious Services: More than 4 times per year    Active Member of [employer] or Organizations: Yes    Attends Banker Meetings: Never    Marital Status: Married  Catering manager Violence: Not At Risk (01/30/2023)   Humiliation, Afraid, Rape,  and Kick questionnaire    Fear of Current or Ex-Partner: No    Emotionally Abused: No    Physically Abused: No    Sexually Abused: No    FAMILY HISTORY Family History  Problem Relation Age of Onset   Prostate cancer Father    Cancer Father    Prostate cancer Brother    Cancer Mother     ALLERGIES:  is allergic to methotrexate.  MEDICATIONS:  Current Outpatient Medications  Medication Sig Dispense Refill   HUMIRA PEN-PSOR/UVEIT STARTER 80 MG/0.8ML & 40MG /0.4ML PNKT      Multiple Vitamin (MULTIVITAMIN WITH MINERALS) TABS tablet Take 1 tablet by mouth daily.     No current facility-administered medications for this visit.    PHYSICAL EXAMINATION:  ECOG PERFORMANCE STATUS: 1 - Symptomatic but completely ambulatory   Vitals:   12/11/23 1116  BP: (!) 147/81  Pulse: 68  Resp: 18  Temp: (!) 96.2 F (35.7 C)  SpO2: 100%    Filed Weights   12/11/23 1116  Weight: 192 lb 4.8 oz (87.2 kg)   Physical Exam Constitutional:      General: He is not in acute distress.    Appearance: He is not diaphoretic.  HENT:     Head: Normocephalic and atraumatic.  Eyes:     General: No scleral icterus. Cardiovascular:     Rate and Rhythm: Normal rate and regular rhythm.  Pulmonary:     Effort: Pulmonary effort is normal. No respiratory distress.  Abdominal:     General: Bowel sounds are normal. There is no distension.     Palpations: Abdomen is soft. There is no mass.  Musculoskeletal:        General: Normal range of motion.     Cervical back: Normal range of motion and neck supple.  Lymphadenopathy:     Cervical: No cervical adenopathy.  Skin:    General: Skin is warm and dry.     Coloration: Skin is not pale.      Findings: No erythema.  Neurological:     Mental Status: He is alert and oriented to person, place, and time.  Psychiatric:        Mood and Affect: Mood and affect normal.      LABORATORY DATA: I have personally reviewed the data as listed:  Appointment on 12/11/2023  Component Date Value Ref Range Status   Sodium 12/11/2023 135  135 - 145 mmol/L Final   Potassium 12/11/2023 4.2  3.5 - 5.1 mmol/L Final   Chloride 12/11/2023 101  98 - 111 mmol/L Final   CO2 12/11/2023 27  22 - 32 mmol/L Final   Glucose, Bld 12/11/2023 85  70 - 99 mg/dL Final   Glucose reference range applies only to samples taken after fasting for at least 8 hours.   BUN 12/11/2023 17  8 - 23 mg/dL Final   Creatinine 40/98/1191 0.87  0.61 - 1.24 mg/dL Final   Calcium 47/82/9562 9.0  8.9 - 10.3 mg/dL Final   Total Protein 13/06/6577 7.9  6.5 - 8.1 g/dL Final   Albumin 46/96/2952 4.0  3.5 - 5.0 g/dL Final   AST 84/13/2440 28  15 - 41 U/L Final   ALT 12/11/2023 23  0 - 44 U/L Final   Alkaline Phosphatase 12/11/2023 83  38 - 126 U/L Final   Total Bilirubin 12/11/2023 0.7  0.0 - 1.2 mg/dL Final   GFR, Estimated 12/11/2023 >60  >60 mL/min Final   Comment: (NOTE) Calculated using the CKD-EPI  Creatinine Equation (2021)    Anion gap 12/11/2023 7  5 - 15 Final   Performed at [facility], [address] [place], [place] [address]   LDH 12/11/2023 156  98 - 192 U/L Final   Performed at [facility], [address] [place], [place] [address]   WBC 12/11/2023 3.2 (L)  4.0 - 10.5 K/uL Final   RBC 12/11/2023 4.16 (L)  4.22 - 5.81 MIL/uL Final   Hemoglobin 12/11/2023 12.9 (L)  13.0 - 17.0 g/dL Final   HCT 64/40/3474 37.9 (L)  39.0 - 52.0 % Final   MCV 12/11/2023 91.1  80.0 - 100.0 fL Final   MCH 12/11/2023 31.0  26.0 - 34.0 pg Final   MCHC 12/11/2023 34.0  30.0 - 36.0 g/dL Final   RDW 25/95/6387 13.3  11.5 - 15.5 % Final   Platelets 12/11/2023 258  150 - 400 K/uL Final   nRBC 12/11/2023 0.0  0.0 - 0.2 %  Final   Neutrophils Relative % 12/11/2023 22  % Final   Neutro Abs 12/11/2023 0.7 (L)  1.7 - 7.7 K/uL Final   Lymphocytes Relative 12/11/2023 60  % Final   Lymphs Abs 12/11/2023 1.9  0.7 - 4.0 K/uL Final   Monocytes Relative 12/11/2023 12  % Final   Monocytes Absolute 12/11/2023 0.4  0.1 - 1.0 K/uL Final   Eosinophils Relative 12/11/2023 5  % Final   Eosinophils Absolute 12/11/2023 0.2  0.0 - 0.5 K/uL Final   Basophils Relative 12/11/2023 1  % Final   Basophils Absolute 12/11/2023 0.0  0.0 - 0.1 K/uL Final   Immature Granulocytes 12/11/2023 0  % Final   Abs Immature Granulocytes 12/11/2023 0.00  0.00 - 0.07 K/uL Final   Performed at [facility], [address] [place], [place] [address]   Folate 12/11/2023 >40.0  >5.9 ng/mL Final   Performed at [facility], [address] [place], [place] [address]   WBC MORPHOLOGY 12/11/2023 MORPHOLOGY UNREMARKABLE   Final   RBC MORPHOLOGY 12/11/2023 MORPHOLOGY UNREMARKABLE   Final   Plt Morphology 12/11/2023 PLATELETS APPEAR ADEQUATE   Final   Normal platelet morphology   Clinical Information 12/11/2023 neutropenia   Final   Performed at [facility], [address] [place], [place] [address]  Orders Only on 12/04/2023  Component Date Value Ref Range Status   WBC 12/04/2023 2.6 (L)  3.8 - 10.8 Thousand/uL Final   RBC 12/04/2023 4.30  4.20 - 5.80 Million/uL Final   Hemoglobin 12/04/2023 12.9 (L)  13.2 - 17.1 g/dL Final   HCT 05/09/1600 39.2  38.5 - 50.0 % Final   MCV 12/04/2023 91.2  80.0 - 100.0 fL Final   MCH 12/04/2023 30.0  27.0 - 33.0 pg Final   MCHC 12/04/2023 32.9  32.0 - 36.0 g/dL Final   Comment: For adults, a slight decrease in the calculated MCHC value (in the range of 30 to 32 g/dL) is most likely not clinically significant; however, it should be interpreted with caution in correlation with other red cell parameters and the patient's clinical condition.    RDW 12/04/2023 13.1  11.0 - 15.0 % Final    Platelets 12/04/2023 261  140 - 400 Thousand/uL Final   MPV 12/04/2023 10.3  7.5 - 12.5 fL Final   Neutro Abs 12/04/2023 317 (LL)  1,500 - 7,800 cells/uL Final   Absolute Lymphocytes 12/04/2023 1,651  850 - 3,900 cells/uL Final   Absolute Monocytes 12/04/2023 458  200 -  950 cells/uL Final   Eosinophils Absolute 12/04/2023 143  15 - 500 cells/uL Final   Basophils Absolute 12/04/2023 31  0 - 200 cells/uL Final   Neutrophils Relative % 12/04/2023 12.2  % Final   Total Lymphocyte 12/04/2023 63.5  % Final   Monocytes Relative 12/04/2023 17.6  % Final   Eosinophils Relative 12/04/2023 5.5  % Final   Basophils Relative 12/04/2023 1.2  % Final   Smear Review 12/04/2023    Final   Comment: Review of peripheral smear confirms automated results.   Office Visit on 11/13/2023  Component Date Value Ref Range Status   WBC 11/13/2023 3.3 (L)  3.8 - 10.8 Thousand/uL Final   RBC 11/13/2023 4.33  4.20 - 5.80 Million/uL Final   Hemoglobin 11/13/2023 13.5  13.2 - 17.1 g/dL Final   HCT 16/08/9603 39.7  38.5 - 50.0 % Final   MCV 11/13/2023 91.7  80.0 - 100.0 fL Final   MCH 11/13/2023 31.2  27.0 - 33.0 pg Final   MCHC 11/13/2023 34.0  32.0 - 36.0 g/dL Final   Comment: For adults, a slight decrease in the calculated MCHC value (in the range of 30 to 32 g/dL) is most likely not clinically significant; however, it should be interpreted with caution in correlation with other red cell parameters and the patient's clinical condition.    RDW 11/13/2023 13.1  11.0 - 15.0 % Final   Platelets 11/13/2023 287  140 - 400 Thousand/uL Final   MPV 11/13/2023 10.3  7.5 - 12.5 fL Final   Neutro Abs 11/13/2023 739 (L)  1,500 - 7,800 cells/uL Final   Absolute Lymphocytes 11/13/2023 1,987  850 - 3,900 cells/uL Final   Absolute Monocytes 11/13/2023 393  200 - 950 cells/uL Final   Eosinophils Absolute 11/13/2023 142  15 - 500 cells/uL Final   Basophils Absolute 11/13/2023 40  0 - 200 cells/uL Final   Neutrophils Relative %  11/13/2023 22.4  % Final   Total Lymphocyte 11/13/2023 60.2  % Final   Monocytes Relative 11/13/2023 11.9  % Final   Eosinophils Relative 11/13/2023 4.3  % Final   Basophils Relative 11/13/2023 1.2  % Final   PSA 11/13/2023 4.82 (H)  < OR = 4.00 ng/mL Final   Comment: The total PSA value from this assay system is  standardized against the WHO standard. The test  result will be approximately 20% lower when compared  to the equimolar-standardized total PSA (Beckman  Coulter). Comparison of serial PSA results should be  interpreted with this fact in mind. . This test was performed using the Siemens  chemiluminescent method. Values obtained from  different assay methods cannot be used interchangeably. PSA levels, regardless of value, should not be interpreted as absolute evidence of the presence or absence of disease.     Positive neutrophil antibodies.  07/29/2018 Flowcytometry showed no significant diagnostic immunophenotypic abnormality. 10% large granular lymphocytes.  RADIOGRAPHIC STUDIES: I have personally reviewed the radiological images as listed and agreed with the findings in the report. No results found.

## 2023-12-11 NOTE — Assessment & Plan Note (Addendum)
History of chronic neutropenia with baseline ANC around 1.  Recent blood work showed decreased ANC in 0.3-0.7 range.  I discussed with patient that the differential diagnosis of leukopenia is broad, including acute or chronic infection, inflammation, nutrition deficiency, autoimmune disease,  ethnic, or malignant etiology including underlying bone morrow disorders.  For the work up of patient's leukoepenia, I recommend checking CBC;CMP, LDH; smear review, folate, Vitamin B12, hepatitis, HIV, flowcytometry and monoclonal gammopathy workup.   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  Humira may cause agranulocytosis. Recommend patient to contact rhematology to consider temporally hold off treatment or decrease dosage while awaiting work up

## 2023-12-13 LAB — KAPPA/LAMBDA LIGHT CHAINS
Kappa free light chain: 44.3 mg/L — ABNORMAL HIGH (ref 3.3–19.4)
Kappa, lambda light chain ratio: 1.51 (ref 0.26–1.65)
Lambda free light chains: 29.3 mg/L — ABNORMAL HIGH (ref 5.7–26.3)

## 2023-12-15 LAB — MULTIPLE MYELOMA PANEL, SERUM
Albumin SerPl Elph-Mcnc: 3.8 g/dL (ref 2.9–4.4)
Albumin/Glob SerPl: 1.1 (ref 0.7–1.7)
Alpha 1: 0.2 g/dL (ref 0.0–0.4)
Alpha2 Glob SerPl Elph-Mcnc: 0.6 g/dL (ref 0.4–1.0)
B-Globulin SerPl Elph-Mcnc: 1.2 g/dL (ref 0.7–1.3)
Gamma Glob SerPl Elph-Mcnc: 1.8 g/dL (ref 0.4–1.8)
Globulin, Total: 3.8 g/dL (ref 2.2–3.9)
IgA: 618 mg/dL — ABNORMAL HIGH (ref 61–437)
IgG (Immunoglobin G), Serum: 1997 mg/dL — ABNORMAL HIGH (ref 603–1613)
IgM (Immunoglobulin M), Srm: 110 mg/dL (ref 15–143)
Total Protein ELP: 7.6 g/dL (ref 6.0–8.5)

## 2023-12-15 LAB — COMP PANEL: LEUKEMIA/LYMPHOMA

## 2023-12-29 ENCOUNTER — Telehealth: Payer: Self-pay

## 2023-12-29 DIAGNOSIS — D709 Neutropenia, unspecified: Secondary | ICD-10-CM

## 2023-12-29 NOTE — Telephone Encounter (Signed)
-----   Message from Rickard Patience sent at 12/29/2023  8:27 AM EST ----- Work up results are good and his white blood cell count has improved, although still low. Please remind patient to discuss with rheumatology to hold off humaria  Please arrange patient to do a follow up in 2 months. Daryl Eastern MD cbc w diff. Please order.thanks.

## 2023-12-29 NOTE — Telephone Encounter (Signed)
Pt was unavailable, Wife Psychologist, clinical) took message.   Please schedule lab/MD (cbc w/ diff) in 2 months and inform pt of appt details.

## 2024-02-05 ENCOUNTER — Telehealth: Payer: Self-pay | Admitting: Internal Medicine

## 2024-02-05 NOTE — Telephone Encounter (Signed)
 Pt came into the office for his AWV. He did not realize it was virtually. He has asked that you please reschedule it for a Thursday virtual appt. I have taken him off the schedule.

## 2024-02-09 NOTE — Telephone Encounter (Signed)
 I spoke w/ patient's wife. She said she'll call me back to schedule because patient has several appointments.

## 2024-02-16 ENCOUNTER — Inpatient Hospital Stay: Payer: Medicare HMO | Attending: Oncology

## 2024-02-16 ENCOUNTER — Inpatient Hospital Stay (HOSPITAL_BASED_OUTPATIENT_CLINIC_OR_DEPARTMENT_OTHER): Payer: Medicare HMO | Admitting: Oncology

## 2024-02-16 ENCOUNTER — Encounter: Payer: Self-pay | Admitting: Oncology

## 2024-02-16 VITALS — BP 142/79 | HR 62 | Temp 96.0°F | Resp 18 | Wt 190.7 lb

## 2024-02-16 DIAGNOSIS — D709 Neutropenia, unspecified: Secondary | ICD-10-CM | POA: Insufficient documentation

## 2024-02-16 DIAGNOSIS — Z87891 Personal history of nicotine dependence: Secondary | ICD-10-CM | POA: Insufficient documentation

## 2024-02-16 LAB — CBC WITH DIFFERENTIAL (CANCER CENTER ONLY)
Abs Immature Granulocytes: 0 10*3/uL (ref 0.00–0.07)
Basophils Absolute: 0 10*3/uL (ref 0.0–0.1)
Basophils Relative: 1 %
Eosinophils Absolute: 0.2 10*3/uL (ref 0.0–0.5)
Eosinophils Relative: 5 %
HCT: 38.7 % — ABNORMAL LOW (ref 39.0–52.0)
Hemoglobin: 13.1 g/dL (ref 13.0–17.0)
Immature Granulocytes: 0 %
Lymphocytes Relative: 46 %
Lymphs Abs: 1.7 10*3/uL (ref 0.7–4.0)
MCH: 31 pg (ref 26.0–34.0)
MCHC: 33.9 g/dL (ref 30.0–36.0)
MCV: 91.5 fL (ref 80.0–100.0)
Monocytes Absolute: 0.4 10*3/uL (ref 0.1–1.0)
Monocytes Relative: 12 %
Neutro Abs: 1.3 10*3/uL — ABNORMAL LOW (ref 1.7–7.7)
Neutrophils Relative %: 36 %
Platelet Count: 268 10*3/uL (ref 150–400)
RBC: 4.23 MIL/uL (ref 4.22–5.81)
RDW: 13.2 % (ref 11.5–15.5)
WBC Count: 3.6 10*3/uL — ABNORMAL LOW (ref 4.0–10.5)
nRBC: 0 % (ref 0.0–0.2)

## 2024-02-16 NOTE — Assessment & Plan Note (Addendum)
 History of chronic neutropenia with baseline ANC around 1.  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  He is off treatment.  Labs are reviewed and discussed with patient. Neutropenia is mild, back to his baseline, 1.3.  Recommend observation.

## 2024-02-16 NOTE — Progress Notes (Signed)
 Hematology/Oncology Progress note [phone] [phone]        Patient Care Team: Margarita Mail, DO as PCP - General (Internal Medicine) Vashti Hey, MD (Ophthalmology) Sherren Mocha Herbie Drape, NP as Nurse Practitioner (Rheumatology) Rickard Patience, MD as Consulting Physician (Oncology)  REASON FOR VISIT neutropenia.   ASSESSMENT & PLAN:   Neutropenia (HCC) History of chronic neutropenia with baseline ANC around 1.  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  He is off treatment.  Labs are reviewed and discussed with patient. Neutropenia is mild, back to his baseline, 1.3.  Recommend observation.    Orders Placed This Encounter  Procedures   CBC with Differential (Cancer Center Only)    Standing Status:   Future    Expected Date:   08/17/2024    Expiration Date:   02/15/2025   Follow up 6 months  All questions were answered. The patient knows to call the clinic with any problems, questions or concerns.  Rickard Patience, MD, PhD [facility] Hematology Oncology 02/16/2024    HISTORY OF PRESENTING ILLNESS: [name] Foster 83 y.o. 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  Patient was accompanied by his wife today. Patient denies any complaint he fails at his baseline health. Denies any night sweats or fever or weight loss. Denies any chest pain, shortness of breath, abdominal pain, or leg swelling. He denies any frequent upper respiratory infections. Patient had some recent labs done with [facility].  he has left shoulder pain for which he had seen PCP and was recommended to see orthopedic surgeon. patient declined. He was then advised to start shoulder exercise and be reassessed.  05/14/2017, hemoglobin 12.9 WBC 2.8 MCV 89.5 platelet 207. ANC 0.9Creatinine 1 bilirubin 0.6 AST 25 ALT 27, alkaline phosphatase In the past,his wbc has been chronically low, ranges  from 12.9-13.3. ANC ranges from 0.9-1.9.  # Work up negative including pathology smear review, ANA, HIV, TSH, B12,  # his neutrophil counts was monitored weekly for the past few weeks. Patient's ANC ranges from 1.0-1.4. He has no signs of frequent infection. 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  Patient with above history reviewed by me today presents for re-establish care for neutropenia.  Patient was last seen by me on 07/19/2019.  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  He has had bone marrow biopsy done in August 2020 Trilineage hematopoiesis, hypercellular bone marrow for his age.  Polyclonal plasmacytosis likely reactive. There is no evidence of lymphoproliferative process.  Overall changes are generally nonspecific and may be related to patient's known history of rheumatoid arthritis.  Abundant iron stores. Jak 2 mutation analysis with reflex to CARL,MPL Jak exon 12-15 are negative.  He presents to re-establish care due to neutropenia.  Recent blood work was reviewed.  05/12/2023 wbc 3 , ANC 0.489 11/13/2023 wbc 3.3 ANC 0.739 12/04/2023 wbc 2.6, ANC 0.317  He denies any recurrent infection, recent antibiotics. He does not drink alcohol. No use of otc herbal supplementation.  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 Positive neutrophil antibodies.   INTERVAL HISTORY [name] Foster is a 83 y.o. male who has above history reviewed by me today presents for follow up visit for neutropenia.  No recent infection.  He has been on Humira.  No new complaints.  Review of Systems  Constitutional:  Negative for appetite change, chills, diaphoresis, fatigue, fever and unexpected weight change.  HENT:   Negative for hearing loss, lump/mass, nosebleeds and sore throat.   Eyes:  Negative for eye problems and icterus.  Respiratory:  Negative  for chest tightness, cough, hemoptysis, shortness of breath and wheezing.   Cardiovascular:  Negative for chest pain and leg swelling.  Gastrointestinal:  Negative for abdominal distention, abdominal pain, blood in stool, diarrhea, nausea and rectal pain.  Endocrine: Negative for hot flashes.  Genitourinary:  Negative for bladder incontinence, difficulty urinating, dysuria, frequency, hematuria and nocturia.   Musculoskeletal:  Negative for arthralgias, back pain, flank pain, gait problem and myalgias.  Skin:  Negative for rash.  Neurological:  Negative for dizziness, gait problem, headaches, numbness and seizures.  Hematological:  Negative for adenopathy. Does not bruise/bleed easily.  Psychiatric/Behavioral:  Negative for confusion and decreased concentration. The patient is not nervous/anxious.     MEDICAL HISTORY: Past Medical History:  Diagnosis Date   Arthritis    BPH (benign prostatic hyperplasia)    Former smoker, stopped smoking many years ago    History of needle biopsy of prostate with negative result 2013   Hypertension    Prostate enlargement    Prostatic intraepithelial neoplasm III 2013    SURGICAL HISTORY: Past Surgical History:  Procedure Laterality Date   CATARACT EXTRACTION W/PHACO Right 06/17/2021   Procedure: CATARACT EXTRACTION PHACO AND INTRAOCULAR LENS PLACEMENT (IOC) RIGHT;  Surgeon: Nevada Crane, MD;  Location: [facility];  Service: Ophthalmology;  Laterality: Right;  10.20 0:52.5   CATARACT EXTRACTION W/PHACO Left 08/05/2021   Procedure: CATARACT EXTRACTION PHACO AND INTRAOCULAR LENS PLACEMENT (IOC) LEFT;  Surgeon: Nevada Crane, MD;  Location: [facility];  Service: Ophthalmology;  Laterality: Left;  4.21 00:31.2   ESOPHAGOGASTRODUODENOSCOPY N/A 06/26/2019   Procedure: ESOPHAGOGASTRODUODENOSCOPY (EGD);  Surgeon: Toney Reil, MD;  Location: [facility];  Service: Gastroenterology;  Laterality: N/A;   HEMORROIDECTOMY      HERNIA REPAIR     JOINT REPLACEMENT Left    THR   PROSTATE BIOPSY  2013   TOTAL HIP ARTHROPLASTY Left    TOTAL HIP ARTHROPLASTY Right 04/26/2019   Procedure: TOTAL HIP ARTHROPLASTY ANTERIOR APPROACH;  Surgeon: Kennedy Bucker, MD;  Location: [facility];  Service: Orthopedics;  Laterality: Right;    SOCIAL HISTORY: Social History   Socioeconomic History   Marital status: Married    Spouse name: Not on file   Number of children: 4   Years of education: Not on file   Highest education level: Not on file  Occupational History   Not on file  Tobacco Use   Smoking status: Former    Current packs/day: 0.00    Types: Cigarettes    Quit date: 06/04/1980    Years since quitting: 43.7   Smokeless tobacco: Never  Vaping Use   Vaping status: Never Used  Substance and Sexual Activity   Alcohol use: No   Drug use: No   Sexual activity: Yes  Other Topics Concern   Not on file  Social History Narrative   Not on file   Social Drivers of Health   Financial Resource Strain: Low Risk  (01/30/2023)   Overall Financial Resource Strain (CARDIA)    Difficulty of Paying Living Expenses: Not hard at all  Food Insecurity: No Food Insecurity (01/30/2023)   Hunger Vital Sign    Worried About Running Out of Food in the Last Year: Never  true    Ran Out of Food in the Last Year: Never true  Transportation Needs: No Transportation Needs (01/30/2023)   PRAPARE - Administrator, Civil Service (Medical): No    Lack of Transportation (Non-Medical): No  Physical Activity: Sufficiently Active (01/30/2023)   Exercise Vital Sign    Days of Exercise per Week: 7 days    Minutes of Exercise per Session: 30 min  Stress: No Stress Concern Present (01/30/2023)   [employer] of Occupational Health - Occupational Stress Questionnaire    Feeling of Stress : Not at all  Social Connections: Socially Integrated (01/30/2023)   Social Connection and Isolation Panel [NHANES]    Frequency of Communication  with Friends and Family: More than three times a week    Frequency of Social Gatherings with Friends and Family: Twice a week    Attends Religious Services: More than 4 times per year    Active Member of [employer] or Organizations: Yes    Attends Banker Meetings: Never    Marital Status: Married  Catering manager Violence: Not At Risk (01/30/2023)   Humiliation, Afraid, Rape, and Kick questionnaire    Fear of Current or Ex-Partner: No    Emotionally Abused: No    Physically Abused: No    Sexually Abused: No    FAMILY HISTORY Family History  Problem Relation Age of Onset   Prostate cancer Father    Cancer Father    Prostate cancer Brother    Cancer Mother     ALLERGIES:  is allergic to methotrexate.  MEDICATIONS:  Current Outpatient Medications  Medication Sig Dispense Refill   HUMIRA PEN-PSOR/UVEIT STARTER 80 MG/0.8ML & 40MG /0.4ML PNKT      Multiple Vitamin (MULTIVITAMIN WITH MINERALS) TABS tablet Take 1 tablet by mouth daily.     No current facility-administered medications for this visit.    PHYSICAL EXAMINATION:  ECOG PERFORMANCE STATUS: 1 - Symptomatic but completely ambulatory   Vitals:   02/16/24 1014 02/16/24 1019  BP: (!) 158/85 (!) 142/79  Pulse: 62   Resp: 18   Temp: (!) 96 F (35.6 C)   SpO2: 99%     Filed Weights   02/16/24 1014  Weight: 190 lb 11.2 oz (86.5 kg)   Physical Exam Constitutional:      General: He is not in acute distress.    Appearance: He is not diaphoretic.  HENT:     Head: Normocephalic and atraumatic.  Eyes:     General: No scleral icterus. Cardiovascular:     Rate and Rhythm: Normal rate.  Pulmonary:     Effort: Pulmonary effort is normal. No respiratory distress.  Abdominal:     General: There is no distension.  Musculoskeletal:        General: Normal range of motion.     Cervical back: Normal range of motion.  Skin:    Findings: No erythema or rash.  Neurological:     Mental Status: He is alert and  oriented to person, place, and time.  Psychiatric:        Mood and Affect: Mood and affect normal.      LABORATORY DATA: I have personally reviewed the data as listed:  Appointment on 02/16/2024  Component Date Value Ref Range Status   WBC Count 02/16/2024 3.6 (L)  4.0 - 10.5 K/uL Final   RBC 02/16/2024 4.23  4.22 - 5.81 MIL/uL Final   Hemoglobin 02/16/2024 13.1  13.0 - 17.0 g/dL Final   HCT  02/16/2024 38.7 (L)  39.0 - 52.0 % Final   MCV 02/16/2024 91.5  80.0 - 100.0 fL Final   MCH 02/16/2024 31.0  26.0 - 34.0 pg Final   MCHC 02/16/2024 33.9  30.0 - 36.0 g/dL Final   RDW 82/95/6213 13.2  11.5 - 15.5 % Final   Platelet Count 02/16/2024 268  150 - 400 K/uL Final   nRBC 02/16/2024 0.0  0.0 - 0.2 % Final   Neutrophils Relative % 02/16/2024 36  % Final   Neutro Abs 02/16/2024 1.3 (L)  1.7 - 7.7 K/uL Final   Lymphocytes Relative 02/16/2024 46  % Final   Lymphs Abs 02/16/2024 1.7  0.7 - 4.0 K/uL Final   Monocytes Relative 02/16/2024 12  % Final   Monocytes Absolute 02/16/2024 0.4  0.1 - 1.0 K/uL Final   Eosinophils Relative 02/16/2024 5  % Final   Eosinophils Absolute 02/16/2024 0.2  0.0 - 0.5 K/uL Final   Basophils Relative 02/16/2024 1  % Final   Basophils Absolute 02/16/2024 0.0  0.0 - 0.1 K/uL Final   Immature Granulocytes 02/16/2024 0  % Final   Abs Immature Granulocytes 02/16/2024 0.00  0.00 - 0.07 K/uL Final   Performed at [facility], [address] [place], [place] [address]    Positive neutrophil antibodies.  07/29/2018 Flowcytometry showed no significant diagnostic immunophenotypic abnormality. 10% large granular lymphocytes.  RADIOGRAPHIC STUDIES: I have personally reviewed the radiological images as listed and agreed with the findings in the report. No results found.

## 2024-03-18 ENCOUNTER — Ambulatory Visit (INDEPENDENT_AMBULATORY_CARE_PROVIDER_SITE_OTHER)

## 2024-03-18 VITALS — BP 140/86 | Ht 68.0 in | Wt 184.0 lb

## 2024-03-18 DIAGNOSIS — Z2821 Immunization not carried out because of patient refusal: Secondary | ICD-10-CM

## 2024-03-18 DIAGNOSIS — Z0000 Encounter for general adult medical examination without abnormal findings: Secondary | ICD-10-CM

## 2024-03-18 NOTE — Patient Instructions (Signed)
 [name] Foster , Thank you for taking time out of your busy schedule to complete your Annual Wellness Visit with me. I enjoyed our conversation and look forward to speaking with you again next year. I, as well as your care team,  appreciate your ongoing commitment to your health goals. Please review the following plan we discussed and let me know if I can assist you in the future. Your Game plan/ To Do List    Referrals: If you haven't heard from the office you've been referred to, please reach out to them at the phone provided.  Remember that you are due for the Vaccinations that were discussed. Follow up Visits: Next Medicare AWV with our clinical staff: 04/06/2024   Have you seen your provider in the last 6 months (3 months if uncontrolled diabetes)? Yes Next Office Visit with your provider: 05/20/2024 at 8:00 AM  Clinician Recommendations:  Aim for 30 minutes of exercise or brisk walking, 6-8 glasses of water , and 5 servings of fruits and vegetables each day.       This is a list of the screening recommended for you and due dates:  Health Maintenance  Topic Date Due   Zoster (Shingles) Vaccine (1 of 2) Never done   Pneumonia Vaccine (2 of 2 - PPSV23) 04/28/2014   COVID-19 Vaccine (2 - Janssen risk series) 09/12/2020   Flu Shot  06/10/2024   HPV Vaccine  Aged Out   Meningitis B Vaccine  Aged Out   DTaP/Tdap/Td vaccine  Discontinued   Hepatitis C Screening  Discontinued    Advanced directives: (In Chart) A copy of your advanced directives are scanned into your chart should your provider ever need it. Advance Care Planning is important because it:  [x]  Makes sure you receive the medical care that is consistent with your values, goals, and preferences  [x]  It provides guidance to your family and loved ones and reduces their decisional burden about whether or not they are making the right decisions based on your wishes.  Follow the link provided in your after visit summary or read  over the paperwork we have mailed to you to help you started getting your Advance Directives in place. If you need assistance in completing these, please reach out to us  so that we can help you!  See attachments for Preventive Care and Fall Prevention Tips.

## 2024-03-18 NOTE — Progress Notes (Signed)
 Because this visit was a virtual/telehealth visit,  certain criteria was not obtained, such a blood pressure, CBG if applicable, and timed get up and go. Any medications not marked as "taking" were not mentioned during the medication reconciliation part of the visit. Any vitals not documented were not able to be obtained due to this being a telehealth visit or patient was unable to self-report a recent blood pressure reading due to a lack of equipment at home via telehealth. Vitals that have been documented are verbally provided by the patient.   This visit was performed by a medical professional under my direct supervision. I was immediately available for consultation/collaboration. I have reviewed and agree with the Annual Wellness Visit documentation.  Subjective:   [name] Foster is a 83 y.o. who presents for a Medicare Wellness preventive visit.  As a reminder, Annual Wellness Visits don't include a physical exam, and some assessments may be limited, especially if this visit is performed virtually. We may recommend an in-person visit if needed.  Visit Complete: Virtual I connected with  [name] Foster on 03/18/24 by a audio enabled telemedicine application and verified that I am speaking with the correct person using two identifiers.  Patient Location: Home  Provider Location: Home Office  I discussed the limitations of evaluation and management by telemedicine. The patient expressed understanding and agreed to proceed.  Vital Signs: Because this visit was a virtual/telehealth visit, some criteria may be missing or patient reported. Any vitals not documented were not able to be obtained and vitals that have been documented are patient reported.  VideoDeclined- This patient declined Librarian, academic. Therefore the visit was completed with audio only.  Persons Participating in Visit: Patient.  AWV Questionnaire: No: Patient Medicare AWV questionnaire  was not completed prior to this visit.  Cardiac Risk Factors include: advanced age (>51men, >51 women);male gender     Objective:     Today's Vitals   03/18/24 0823  BP: (!) 140/86  Weight: 184 lb (83.5 kg)  Height: 5\' 8"  (1.727 m)   Body mass index is 27.98 kg/m.     03/18/2024    8:28 AM 12/11/2023   11:18 AM 01/30/2023    9:55 AM 08/05/2021    6:56 AM 06/17/2021    9:42 AM 06/06/2021   10:14 AM 06/05/2020   10:21 AM  Advanced Directives  Does Patient Have a Medical Advance Directive? Yes  Yes No Yes Yes Yes  Type of Advance Directive Out of facility DNR (pink MOST or yellow form)    Healthcare Power of [place];Living will Healthcare Power of [place];Living will Healthcare Power of [place];Living will  Does patient want to make changes to medical advance directive? No - Patient declined    No - Patient declined    Copy of Healthcare Power of Attorney in Chart?     Yes - validated most recent copy scanned in chart (See row information) Yes - validated most recent copy scanned in chart (See row information) Yes - validated most recent copy scanned in chart (See row information)  Would patient like information on creating a medical advance directive?  No - Patient declined  No - Patient declined     Pre-existing out of facility DNR order (yellow form or pink MOST form) Yellow form placed in chart (order not valid for inpatient use)          Current Medications (verified) Outpatient Encounter Medications as of 03/18/2024  Medication Sig   HUMIRA  PEN-PSOR/UVEIT STARTER 80 MG/0.8ML & 40MG /0.4ML PNKT    Multiple Vitamin (MULTIVITAMIN WITH MINERALS) TABS tablet Take 1 tablet by mouth daily.   No facility-administered encounter medications on file as of 03/18/2024.    Allergies (verified) Methotrexate    History: Past Medical History:  Diagnosis Date   Arthritis    BPH (benign prostatic hyperplasia)    Former smoker, stopped smoking many years ago    History of needle biopsy of  prostate with negative result 2013   Hypertension    Prostate enlargement    Prostatic intraepithelial neoplasm III 2013   Past Surgical History:  Procedure Laterality Date   CATARACT EXTRACTION W/PHACO Right 06/17/2021   Procedure: CATARACT EXTRACTION PHACO AND INTRAOCULAR LENS PLACEMENT (IOC) RIGHT;  Surgeon: Rosa College, MD;  Location: [facility];  Service: Ophthalmology;  Laterality: Right;  10.20 0:52.5   CATARACT EXTRACTION W/PHACO Left 08/05/2021   Procedure: CATARACT EXTRACTION PHACO AND INTRAOCULAR LENS PLACEMENT (IOC) LEFT;  Surgeon: Rosa College, MD;  Location: [facility];  Service: Ophthalmology;  Laterality: Left;  4.21 00:31.2   ESOPHAGOGASTRODUODENOSCOPY N/A 06/26/2019   Procedure: ESOPHAGOGASTRODUODENOSCOPY (EGD);  Surgeon: Selena Daily, MD;  Location: [facility];  Service: Gastroenterology;  Laterality: N/A;   HEMORROIDECTOMY     HERNIA REPAIR     JOINT REPLACEMENT Left    THR   PROSTATE BIOPSY  2013   TOTAL HIP ARTHROPLASTY Left    TOTAL HIP ARTHROPLASTY Right 04/26/2019   Procedure: TOTAL HIP ARTHROPLASTY ANTERIOR APPROACH;  Surgeon: Molli Angelucci, MD;  Location: [facility];  Service: Orthopedics;  Laterality: Right;   Family History  Problem Relation Age of Onset   Prostate cancer Father    Cancer Father    Prostate cancer Brother    Cancer Mother    Social History   Socioeconomic History   Marital status: Married    Spouse name: Not on file   Number of children: 4   Years of education: Not on file   Highest education level: Not on file  Occupational History   Not on file  Tobacco Use   Smoking status: Former    Current packs/day: 0.00    Types: Cigarettes    Quit date: 06/04/1980    Years since quitting: 43.8   Smokeless tobacco: Never  Vaping Use   Vaping status: Never Used  Substance and Sexual Activity   Alcohol use: No   Drug use: No   Sexual activity: Yes  Other Topics Concern   Not on file  Social  History Narrative   Not on file   Social Drivers of Health   Financial Resource Strain: Low Risk  (03/18/2024)   Overall Financial Resource Strain (CARDIA)    Difficulty of Paying Living Expenses: Not hard at all  Food Insecurity: No Food Insecurity (03/18/2024)   Hunger Vital Sign    Worried About Running Out of Food in the Last Year: Never true    Ran Out of Food in the Last Year: Never true  Transportation Needs: No Transportation Needs (03/18/2024)   PRAPARE - Administrator, Civil Service (Medical): No    Lack of Transportation (Non-Medical): No  Physical Activity: Sufficiently Active (03/18/2024)   Exercise Vital Sign    Days of Exercise per Week: 7 days    Minutes of Exercise per Session: 30 min  Stress: No Stress Concern Present (03/18/2024)   [employer] of Occupational Health - Occupational Stress Questionnaire    Foster of Stress :  Not at all  Social Connections: Socially Integrated (03/18/2024)   Social Connection and Isolation Panel [NHANES]    Frequency of Communication with Friends and Family: More than three times a week    Frequency of Social Gatherings with Friends and Family: Twice a week    Attends Religious Services: More than 4 times per year    Active Member of [employer] or Organizations: Yes    Attends Banker Meetings: Never    Marital Status: Married    Tobacco Counseling Counseling given: Not Answered    Clinical Intake:  Pre-visit preparation completed: Yes  Pain : No/denies pain     BMI - recorded: 27.98 Nutritional Status: BMI 25 -29 Overweight Nutritional Risks: Other (Comment) Diabetes: No  No results found for: "HGBA1C"   How often do you need to have someone help you when you read instructions, pamphlets, or other written materials from your doctor or pharmacy?: 1 - Never What is the last grade level you completed in school?: 11th grade  Interpreter Needed?: No  Information entered by :: Josemiguel Gries  whitield,CMA   Activities of Daily Living     03/18/2024    8:29 AM 05/12/2023    8:38 AM  In your present state of health, do you have any difficulty performing the following activities:  Hearing? 0 0  Vision? 0 0  Difficulty concentrating or making decisions? 0 0  Walking or climbing stairs? 0 0  Dressing or bathing? 0 0  Doing errands, shopping? 0 0  Preparing Food and eating ? N   Using the Toilet? N   In the past six months, have you accidently leaked urine? N   Do you have problems with loss of bowel control? N   Managing your Medications? N   Managing your Finances? N   Housekeeping or managing your Housekeeping? N     Patient Care Team: Rockney Cid, DO as PCP - General (Internal Medicine) Larene Pleasant, MD (Ophthalmology) Lugenia Said Avie Lemme, NP as Nurse Practitioner (Rheumatology) Timmy Forbes, MD as Consulting Physician (Oncology)  Indicate any recent Medical Services you may have received from other than [facility] providers in the past year (date may be approximate).     Assessment:    This is a routine wellness examination for Oma.  Hearing/Vision screen Hearing Screening - Comments:: No hearing difficulties Vision Screening - Comments:: No vision difficulties   Goals Addressed             This Visit's Progress    Patient Stated       Patient would like yo continue to play golf        Depression Screen     03/18/2024    8:30 AM 11/13/2023    8:36 AM 05/12/2023    8:38 AM 01/30/2023    9:52 AM 11/11/2022    9:36 AM 09/08/2022   10:56 AM 05/07/2022    8:44 AM  PHQ 2/9 Scores  PHQ - 2 Score 0 0 0 0 0 0 0  PHQ- 9 Score 0  0  0 0 0    Fall Risk     03/18/2024    8:28 AM 11/13/2023    8:35 AM 05/12/2023    8:35 AM 01/30/2023    9:51 AM 11/11/2022    9:33 AM  Fall Risk   Falls in the past year? 0 0 0 0 0  Number falls in past yr: 0 0 0 0 0  Injury with Fall? 0 0  0 0 0  Risk for fall due to : No Fall Risks No Fall Risks  No Fall Risks   Follow up  Falls prevention discussed;Falls evaluation completed Falls evaluation completed  Education provided;Falls prevention discussed     MEDICARE RISK AT HOME:  Medicare Risk at Home Any stairs in or around the home?: Yes If so, are there any without handrails?: No Home free of loose throw rugs in walkways, pet beds, electrical cords, etc?: Yes Adequate lighting in your home to reduce risk of falls?: Yes Life alert?: No Use of a cane, walker or w/c?: No Grab bars in the bathroom?: Yes Shower chair or bench in shower?: Yes Elevated toilet seat or a handicapped toilet?: Yes  TIMED UP AND GO:  Was the test performed?  No  Cognitive Function: 6CIT completed        03/18/2024    8:26 AM 01/30/2023    9:58 AM 06/05/2020   10:23 AM  6CIT Screen  What Year? 0 points 0 points 0 points  What month? 0 points 0 points 0 points  What time? 0 points 0 points 0 points  Count back from 20 0 points 0 points 0 points  Months in reverse 0 points 0 points 0 points  Repeat phrase 0 points 0 points 2 points  Total Score 0 points 0 points 2 points    Immunizations Immunization History  Administered Date(s) Administered   Influenza, Seasonal, Injecte, Preservative Fre 09/02/2013   Janssen (J&J) SARS-COV-2 Vaccination 08/15/2020   Pneumococcal Conjugate-13 03/03/2014    Screening Tests Health Maintenance  Topic Date Due   Zoster Vaccines- Shingrix (1 of 2) Never done   Pneumonia Vaccine 42+ Years old (2 of 2 - PPSV23) 04/28/2014   COVID-19 Vaccine (2 - Janssen risk series) 09/12/2020   INFLUENZA VACCINE  06/10/2024   HPV VACCINES  Aged Out   Meningococcal B Vaccine  Aged Out   DTaP/Tdap/Td  Discontinued   Hepatitis C Screening  Discontinued    Health Maintenance  Health Maintenance Due  Topic Date Due   Zoster Vaccines- Shingrix (1 of 2) Never done   Pneumonia Vaccine 56+ Years old (2 of 2 - PPSV23) 04/28/2014   COVID-19 Vaccine (2 - Janssen risk series) 09/12/2020   Health  Maintenance Items Addressed:  Additional Screening:  Vision Screening: Recommended annual ophthalmology exams for early detection of glaucoma and other disorders of the eye.  Dental Screening: Recommended annual dental exams for proper oral hygiene  Community Resource Referral / Chronic Care Management: CRR required this visit?  No   CCM required this visit?  No   Plan:    I have personally reviewed and noted the following in the patient's chart:   Medical and social history Use of alcohol, tobacco or illicit drugs  Current medications and supplements including opioid prescriptions. Patient is not currently taking opioid prescriptions. Functional ability and status Nutritional status Physical activity Advanced directives List of other physicians Hospitalizations, surgeries, and ER visits in previous 12 months Vitals Screenings to include cognitive, depression, and falls Referrals and appointments  In addition, I have reviewed and discussed with patient certain preventive protocols, quality metrics, and best practice recommendations. A written personalized care plan for preventive services as well as general preventive health recommendations were provided to patient.   Freeda Jerry, [place]   03/18/2024   After Visit Summary: (MyChart) Due to this being a telephonic visit, the after visit summary with patients personalized plan was offered to patient  via MyChart   Notes: Nothing significant to report at this time.

## 2024-05-01 ENCOUNTER — Emergency Department
Admission: EM | Admit: 2024-05-01 | Discharge: 2024-05-01 | Disposition: A | Discharge status: Home / Self Care | Attending: Emergency Medicine | Admitting: Emergency Medicine

## 2024-05-01 ENCOUNTER — Other Ambulatory Visit: Payer: Self-pay

## 2024-05-01 DIAGNOSIS — M069 Rheumatoid arthritis, unspecified: Secondary | ICD-10-CM | POA: Insufficient documentation

## 2024-05-01 MED ORDER — PREDNISONE 20 MG PO TABS
40.0000 mg | ORAL_TABLET | Freq: Every day | ORAL | 0 refills | Status: AC
Start: 2024-05-01 — End: 2024-05-06

## 2024-05-01 NOTE — ED Provider Notes (Signed)
 [facility] Provider Note    None    (approximate)   History   Joint Pain   HPI  [name] Foster is a 83 y.o. male with a past medical history of rheumatoid arthritis who presents today for evaluation of joint pain for the past 3 weeks.  Patient reports that his pain is primarily in his bilateral shoulders, wrists, hands, and low back.  He feels like he is very stiff.  These would be normal locations that he gets pain with his rheumatoid arthritis.  He is currently on Humira.  He follows with a rheumatologist.  He denies any recent fevers or chills.  He denies any injuries.  No constitutional symptoms.  No recent tick exposures.  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   Generalized weakness 06/02/2019   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   Pneumonia 05/25/2019   Sepsis (HCC)    Goals of care, counseling/discussion    DNR (do not resuscitate) discussion    Atypical pneumonia    H/O bilateral hip replacements 04/26/2019   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   Incomplete emptying of bladder 02/03/2017   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   Abnormal finding on chest xray 07/18/2013   Chronic cough 07/18/2013   Tests ordered 09/03/2012   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    Benign localized prostatic hyperplasia with lower urinary tract symptoms (LUTS) 07/07/2012   Elevated prostate specific antigen (PSA) 07/07/2012          Physical Exam   Triage Vital Signs: ED Triage Vitals  Encounter Vitals Group     BP 05/01/24 0651 (!) 173/98     Girls Systolic BP Percentile --      Girls Diastolic BP Percentile --      Boys Systolic BP Percentile --      Boys Diastolic BP Percentile --      Pulse Rate 05/01/24 0651 80     Resp 05/01/24 0651 18     Temp 05/01/24 0651 97.9 F (36.6 C)     Temp Source 05/01/24 0651 Oral     SpO2 05/01/24 0651 94 %     Weight 05/01/24 0652 190 lb (86.2 kg)     Height 05/01/24 0652 5' 8 (1.727 m)     Head Circumference --      Peak Flow --      Pain Score 05/01/24 0652 8     Pain Loc --      Pain Education --      Exclude from Growth Chart --     Most recent vital signs: Vitals:   05/01/24 0651  BP: (!) 173/98  Pulse: 80  Resp: 18  Temp: 97.9 F (36.6 C)  SpO2: 94%    Physical Exam Vitals and nursing note reviewed.  Constitutional:      General: Awake and alert.  No acute distress.    Appearance: Normal appearance. The patient is normal weight.  HENT:     Head: Normocephalic and atraumatic.     Mouth: Mucous membranes are moist.  Eyes:     General: PERRL. Normal EOMs        Right eye: No discharge.        Left eye: No discharge.     Conjunctiva/sclera: Conjunctivae normal.  Cardiovascular:     Rate and Rhythm: Normal rate and regular rhythm.     Pulses: Normal pulses.  Pulmonary:     Effort: Pulmonary effort is normal. No respiratory distress.     Breath sounds: Normal breath sounds.  Abdominal:     Abdomen is soft. There is no abdominal tenderness. No rebound or guarding. No distention. Musculoskeletal:        General: No swelling. Normal range of motion.     Cervical back: Normal range of motion and neck supple.  Bouchard's noted.  Patient able to abduct right shoulder to 50 degrees, and left  shoulder approximately 45 degrees, both with mild discomfort.  No obvious effusion, warmth, or erythema.  Able to range elbows and wrists, hips, knees, and ankles fully.  No noted tophi. Skin:    General: Skin is warm and dry.     Capillary Refill: Capillary refill takes less than 2 seconds.     Findings: No rash.  Neurological:     Mental Status: The patient is awake and alert.      ED Results / Procedures / Treatments   Labs (all labs ordered are listed, but only abnormal results are displayed) Labs Reviewed - No data to display   EKG     RADIOLOGY     PROCEDURES:  Critical Care performed:   Procedures   MEDICATIONS ORDERED IN ED: Medications - No data to display   IMPRESSION / MDM / ASSESSMENT AND PLAN / ED COURSE  I reviewed the triage vital signs and the nursing notes.   Differential diagnosis includes, but is not limited to, rheumatoid arthritis flare, osteoarthritis, less likely effusion or septic arthritis.  I reviewed the patient's chart.  Patient follows with Dr. Randy for his rheumatoid arthritis involving multiple sites with a positive rheumatoid factor.  He was last seen on 02/25/2024.  He is on Humira with plan to continue immunotherapy as he is not having recurrent infections.  Patient is awake and alert, hemodynamically stable and afebrile.  Exam is consistent with a rheumatoid arthritis flare.  There is no erythema, warmth, effusion, or constitutional symptoms to suggest infectious etiology.  No recent tick exposures.  Patient is also certain that this is a rheumatoid arthritis flare.  Will start a short course of steroids.  I advised that he call his rheumatologist tomorrow to advise him of this medication addition.  We discussed side effects of this medication.  We discussed strict return precautions as well.  Patient or stands and agrees with plan.  Discharged with this family member.  Patient's presentation is most consistent with severe  exacerbation of chronic illness.    FINAL CLINICAL IMPRESSION(S) / ED DIAGNOSES   Final diagnoses:  Rheumatoid arthritis flare (HCC)     Rx / DC Orders   ED Discharge Orders          Ordered    predniSONE (DELTASONE) 20 MG tablet  Daily with breakfast        05/01/24 0723  Note:  This document was prepared using Dragon voice recognition software and may include unintentional dictation errors.   Gary Bultman E, PA-C 05/01/24 [id number]    Willo Dunnings, MD 05/01/24 1536

## 2024-05-01 NOTE — Discharge Instructions (Addendum)
 You may take the steroids as prescribed.  Please follow-up with your rheumatologist, call him tomorrow to make an appointment and let him know that you are on these medicines.  Please return for any new, worsening, or change in symptoms or other concerns.  It was a pleasure caring for you today.

## 2024-05-01 NOTE — ED Triage Notes (Signed)
 Pt presented to ED with c/o joint pain x 3 weeks. States that his hands, elbows, shoulders, and hips are painful with movement. Denies nausea, vomiting, fevers. States has been taking tylenol  without relief of pain. Pain 8/10.

## 2024-05-20 ENCOUNTER — Other Ambulatory Visit: Payer: Self-pay

## 2024-05-20 ENCOUNTER — Ambulatory Visit: Payer: Medicare HMO | Admitting: Internal Medicine

## 2024-05-20 ENCOUNTER — Encounter: Payer: Self-pay | Admitting: Internal Medicine

## 2024-05-20 VITALS — BP 122/70 | HR 80 | Temp 97.9°F | Resp 16 | Ht 68.0 in | Wt 184.1 lb

## 2024-05-20 DIAGNOSIS — Z87898 Personal history of other specified conditions: Secondary | ICD-10-CM

## 2024-05-20 DIAGNOSIS — Z8042 Family history of malignant neoplasm of prostate: Secondary | ICD-10-CM | POA: Diagnosis not present

## 2024-05-20 DIAGNOSIS — Z13220 Encounter for screening for lipoid disorders: Secondary | ICD-10-CM

## 2024-05-20 DIAGNOSIS — D709 Neutropenia, unspecified: Secondary | ICD-10-CM

## 2024-05-20 DIAGNOSIS — M059 Rheumatoid arthritis with rheumatoid factor, unspecified: Secondary | ICD-10-CM

## 2024-05-20 DIAGNOSIS — R351 Nocturia: Secondary | ICD-10-CM

## 2024-05-20 NOTE — Progress Notes (Signed)
 Established Patient Office Visit  Subjective   Patient ID: [name] Foster, male    DOB: [birth year]  Age: 83 y.o. MRN: [id number]  Chief Complaint  Patient presents with   Medical Management of Chronic Issues    6 month recheck   Hand Pain    Right worse then left    Hand Pain     [name] Foster presents for follow up on chronic medical conditions. He is here with his wife today.   Discussed the use of AI scribe software for clinical note transcription with the patient, who gave verbal consent to proceed.  History of Present Illness [name] Foster is an 83 year old male with rheumatoid arthritis who presents with worsening joint pain and weakness.  Joint pain and weakness have worsened, especially in the wrists and hands, despite ongoing Humira treatment since April. Grip strength is notably reduced on the right side, with pain concentrated in the wrists and joints. A recent flare-up led to an emergency room visit and a short course of steroids, which provided temporary relief. Symptoms returned after the steroids ended. Tylenol  use has not significantly alleviated the pain. He has not used Voltaren gel. A rheumatology appointment is scheduled for October.  He experiences nocturia, urinating up to five times nightly, despite limiting evening fluid intake. He consumes ice cream at night, which may contribute to this issue.  Breathing remains stable with no significant issues, even during summer humidity.   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 Had to be seen in the ER 6/22 and was treated with Prednisone  which helped but symptoms returned.  -Following with Rheumatology and Ophthalmology  Neutropenia: -Was evaluated by Hematology,  thinking this is due to Humira  -Last CBC 4/25 - WBC 3.6, abs neutrophils 1.3  Atopic Dermatitis: -No longer on steroid cream, following with Dermatology  COPD/Emphysema: -COPD status: controlled -Current medications: None -Dyspnea frequency: rarely  -Cough frequency: rare -Limitation of activity: no  Elevated PSA: -PSA 1/25 4.82 -Has mild BPH symptoms -He had seen Urology in the past for this. He does have a family history of prostate cancer in his father and brother.   Health Maintenance: -Blood work due -Patient continues to decline flu and pneumonia vaccines  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   Generalized weakness 06/02/2019   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   Pneumonia 05/25/2019   Sepsis (HCC)    Goals of care, counseling/discussion    DNR (do not resuscitate) discussion    Atypical pneumonia    H/O bilateral hip replacements 04/26/2019   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   Incomplete emptying of bladder 02/03/2017   ED (erectile dysfunction)  of organic origin 10/29/2015   Abnormal finding on chest xray 07/18/2013   Chronic cough 07/18/2013   Tests ordered 09/03/2012   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   Benign localized prostatic hyperplasia with lower urinary tract symptoms (LUTS)  07/07/2012   Elevated prostate specific antigen (PSA) 07/07/2012   Past Medical History:  Diagnosis Date   Arthritis    BPH (benign prostatic hyperplasia)    Former smoker, stopped smoking many years ago    History of needle biopsy of prostate with negative result 2013   Hypertension    Prostate enlargement    Prostatic intraepithelial neoplasm III 2013   Past Surgical History:  Procedure Laterality Date   CATARACT EXTRACTION W/PHACO Right 06/17/2021   Procedure: CATARACT EXTRACTION PHACO AND INTRAOCULAR LENS PLACEMENT (IOC) RIGHT;  Surgeon: Myrna Adine Anes, MD;  Location: [facility];  Service: Ophthalmology;  Laterality: Right;  10.20 0:52.5   CATARACT EXTRACTION W/PHACO Left 08/05/2021   Procedure: CATARACT EXTRACTION PHACO AND INTRAOCULAR LENS PLACEMENT (IOC) LEFT;  Surgeon: Myrna Adine Anes, MD;  Location: [facility];  Service: Ophthalmology;  Laterality: Left;  4.21 00:31.2   ESOPHAGOGASTRODUODENOSCOPY N/A 06/26/2019   Procedure: ESOPHAGOGASTRODUODENOSCOPY (EGD);  Surgeon: Unk Corinn Skiff, MD;  Location: [facility];  Service: Gastroenterology;  Laterality: N/A;   HEMORROIDECTOMY     HERNIA REPAIR     JOINT REPLACEMENT Left    THR   PROSTATE BIOPSY  2013   TOTAL HIP ARTHROPLASTY Left    TOTAL HIP ARTHROPLASTY Right 04/26/2019   Procedure: TOTAL HIP ARTHROPLASTY ANTERIOR APPROACH;  Surgeon: Kathlynn Sharper, MD;  Location: [facility];  Service: Orthopedics;  Laterality: Right;   Social History   Tobacco Use   Smoking status: Former    Current packs/day: 0.00    Types: Cigarettes    Quit date: 06/04/1980    Years since quitting: 43.9   Smokeless tobacco: Never  Vaping Use   Vaping status: Never Used  Substance Use Topics   Alcohol use: No   Drug use: No   Social History   Socioeconomic History   Marital status: Married    Spouse name: Not on file   Number of children: 4   Years of education: Not on file   Highest education level: Not on file   Occupational History   Not on file  Tobacco Use   Smoking status: Former    Current packs/day: 0.00    Types: Cigarettes    Quit date: 06/04/1980    Years since quitting: 43.9   Smokeless tobacco: Never  Vaping Use   Vaping status: Never Used  Substance and Sexual Activity   Alcohol use: No   Drug use: No   Sexual activity: Yes  Other Topics Concern   Not on file  Social History Narrative   Not on file   Social Drivers of Health   Financial Resource Strain: Low Risk  (03/18/2024)   Overall Financial Resource Strain (CARDIA)    Difficulty of Paying Living Expenses: Not hard at all  Food Insecurity: No Food Insecurity (03/18/2024)   Hunger Vital Sign    Worried About Running Out of Food in the Last Year: Never true    Ran Out of Food in the Last Year: Never true  Transportation Needs: No Transportation Needs (03/18/2024)   PRAPARE - Administrator, Civil Service (Medical): No    Lack of Transportation (Non-Medical): No  Physical Activity: Sufficiently Active (03/18/2024)   Exercise Vital Sign  Days of Exercise per Week: 7 days    Minutes of Exercise per Session: 30 min  Stress: No Stress Concern Present (03/18/2024)   [employer] of Occupational Health - Occupational Stress Questionnaire    Feeling of Stress : Not at all  Social Connections: Socially Integrated (03/18/2024)   Social Connection and Isolation Panel    Frequency of Communication with Friends and Family: More than three times a week    Frequency of Social Gatherings with Friends and Family: Twice a week    Attends Religious Services: More than 4 times per year    Active Member of [employer] or Organizations: Yes    Attends Banker Meetings: Never    Marital Status: Married  Catering manager Violence: Not At Risk (03/18/2024)   Humiliation, Afraid, Rape, and Kick questionnaire    Fear of Current or Ex-Partner: No    Emotionally Abused: No    Physically Abused: No    Sexually Abused: No    Family Status  Relation Name Status   Father  Deceased   MGM  Alive   Brother  (Not Specified)   Mother  Deceased  No partnership data on file   Family History  Problem Relation Age of Onset   Prostate cancer Father    Cancer Father    Prostate cancer Brother    Cancer Mother    Allergies  Allergen Reactions   Methotrexate      Other reaction(s): Weakness,       Review of Systems  Genitourinary:  Positive for frequency.  Musculoskeletal:  Positive for joint pain.  All other systems reviewed and are negative.     Objective:     BP 122/70 (Cuff Size: Large)   Pulse 80   Temp 97.9 F (36.6 C) (Oral)   Resp 16   Ht 5' 8 (1.727 m)   Wt 184 lb 1.6 oz (83.5 kg)   SpO2 96%   BMI 27.99 kg/m  BP Readings from Last 3 Encounters:  05/20/24 122/70  05/01/24 (!) 173/98  03/18/24 (!) 140/86   Wt Readings from Last 3 Encounters:  05/20/24 184 lb 1.6 oz (83.5 kg)  05/01/24 190 lb (86.2 kg)  03/18/24 184 lb (83.5 kg)      Physical Exam Constitutional:      Appearance: Normal appearance.  HENT:     Head: Normocephalic and atraumatic.     Mouth/Throat:     Mouth: Mucous membranes are moist.     Pharynx: Oropharynx is clear.  Eyes:     Conjunctiva/sclera: Conjunctivae normal.     Comments: Cataracts bilaterally   Cardiovascular:     Rate and Rhythm: Normal rate and regular rhythm.  Pulmonary:     Effort: Pulmonary effort is normal.     Breath sounds: Normal breath sounds.  Musculoskeletal:     Right lower leg: No edema.     Left lower leg: No edema.  Skin:    General: Skin is warm and dry.  Neurological:     General: No focal deficit present.     Mental Status: He is alert. Mental status is at baseline.  Psychiatric:        Mood and Affect: Mood normal.        Behavior: Behavior normal.      No results found for any visits on 05/20/24.  Last CBC Lab Results  Component Value Date   WBC 3.6 (L) 02/16/2024   HGB 13.1 02/16/2024   HCT 38.7 (L)  02/16/2024   MCV 91.5 02/16/2024   MCH 31.0 02/16/2024   RDW 13.2 02/16/2024   PLT 268 02/16/2024   Last metabolic panel Lab Results  Component Value Date   GLUCOSE 85 12/11/2023   NA 135 12/11/2023   K 4.2 12/11/2023   CL 101 12/11/2023   CO2 27 12/11/2023   BUN 17 12/11/2023   CREATININE 0.87 12/11/2023   EGFR 86 05/12/2023   CALCIUM 9.0 12/11/2023   PHOS 2.8 06/29/2019   PROT 7.9 12/11/2023   ALBUMIN 4.0 12/11/2023   LABGLOB 3.8 12/11/2023   BILITOT 0.7 12/11/2023   ALKPHOS 83 12/11/2023   AST 28 12/11/2023   ALT 23 12/11/2023   ANIONGAP 7 12/11/2023   Last lipids Lab Results  Component Value Date   CHOL 158 05/12/2023   HDL 49 05/12/2023   LDLCALC 94 05/12/2023   TRIG 67 05/12/2023   CHOLHDL 3.2 05/12/2023   Last hemoglobin A1c No results found for: HGBA1C Last thyroid  functions Lab Results  Component Value Date   TSH 4.289 05/26/2019   Last vitamin D No results found for: 25OHVITD2, 25OHVITD3, VD25OH Last vitamin B12 and Folate Lab Results  Component Value Date   VITAMINB12 462 12/11/2023   FOLATE >40.0 12/11/2023      The ASCVD Risk score (Arnett DK, et al., 2019) failed to calculate for the following reasons:   The 2019 ASCVD risk score is only valid for ages 78 to 54    Assessment & Plan:   Assessment & Plan Rheumatoid Arthritis Rheumatoid arthritis flare-up managed with steroids. Humira resumed in April, may cause low neutrophil count. Discussed topical Voltaren gel and need for rheumatologist consultation. Emphasized infection risk due to immunosuppression. - Recheck labs: CBC, PSA, cholesterol. - Consider longer steroid course if labs permit. - Use Voltaren gel for symptom relief. - Contact rheumatologist about Humira. - Advise on infection prevention measures.  Nocturia Intermittent nocturia possibly due to prostate enlargement. Discussed lifestyle changes and treatment options including Flomax and Solpalmetto. - Consider  Flomax if symptoms worsen. - Consider Saw palmetto.  - Advise fluid restriction before bedtime.  General Health Maintenance Routine health maintenance with recent labs showing good kidney and liver function. Emphasized regular screenings. - Order routine labs: CBC, PSA, cholesterol. - Review lab results and follow up.  - PSA - CBC w/Diff/Platelet - Comprehensive Metabolic Panel (CMET) - Lipid Profile   Return in about 6 months (around 11/20/2024).    Sharyle Fischer, DO

## 2024-05-20 NOTE — Patient Instructions (Addendum)
 It was great seeing you today!  Plan discussed at today's visit: -Blood work ordered today, results will be uploaded to MyChart.  -Recommend trying Voltaren over the counter gel for arthritis pain and I will let you know when I get the labs if another round of steroids is appropriate -Consider starting a medication called Flomax to help with urinary frequency (which is a prescription) -Can also try a supplement called saw palmetto over the counter to help with urination as well   Follow up in: 6 months   Take care and let us  know if you have any questions or concerns prior to your next visit.  Dr. Bernardo  Tamsulosin Capsules What is this medication? TAMSULOSIN (tam SOO loe sin) treats the symptoms of an enlarged prostate (benign prostatic hyperplasia). It works by relaxing the muscles in the prostate and bladder, which makes it easier to urinate. It belongs to a group of medications called alpha blockers. This medicine may be used for other purposes; ask your health care provider or pharmacist if you have questions. COMMON BRAND NAME(S): Flomax What should I tell my care team before I take this medication? They need to know if you have any of the following conditions: Advanced kidney disease Advanced liver disease Low blood pressure Prostate cancer An unusual or allergic reaction to tamsulosin, sulfa drugs, other medications, foods, dyes, or preservatives Pregnant or trying to get pregnant Breast-feeding How should I use this medication? Take this medication by mouth about 30 minutes after the same meal every day. Follow the directions on the prescription label. Swallow the capsules whole with a glass of water . Do not crush, chew, or open capsules. Do not take your medication more often than directed. Do not stop taking your medication unless your care team tells you to. Talk to your care team about the use of this medication in children. Special care may be needed. Overdosage: If you  think you have taken too much of this medicine contact a poison control center or emergency room at once. NOTE: This medicine is only for you. Do not share this medicine with others. What if I miss a dose? If you miss a dose, take it as soon as you can. If it is almost time for your next dose, take only that dose. Do not take double or extra doses. If you stop taking your medication for several days or more, ask your care team what dose you should start back on. What may interact with this medication? Cimetidine Certain medications for erectile dysfunction, such as sildenafil, tadalafil, vardenafil Fluoxetine Ketoconazole Medications for blood pressure Other alpha blockers, such as alfuzosin, doxazosin , phentolamine, phenoxybenzamine, prazosin, terazosin Warfarin This list may not describe all possible interactions. Give your health care provider a list of all the medicines, herbs, non-prescription drugs, or dietary supplements you use. Also tell them if you smoke, drink alcohol, or use illegal drugs. Some items may interact with your medicine. What should I watch for while using this medication? Visit your care team for regular checks on your progress. You will need blood work done before you start this medication and regularly while you are taking it. Check your blood pressure as directed. Know what your blood pressure should be and when to contact your care team. This medication may affect your coordination, reaction time, or judgment. Do not drive or operate machinery until you know how this medication affects you. Sit up or stand slowly to reduce the risk of dizzy or fainting spells. Drinking alcohol  with this medication can increase the risk of these side effects. These effects may decrease once your body adjusts to the medication. Contact your care team right away if you have an erection that lasts longer than 4 hours or if it becomes painful. This may be a sign of a serious problem and must  be treated right away to prevent permanent damage. If you are thinking of having cataract surgery, tell your eye surgeon that you have taken this medication. What side effects may I notice from receiving this medication? Side effects that you should report to your care team as soon as possible: Allergic reactions--skin rash, itching, hives, swelling of the face, lips, tongue, or throat Low blood pressure--dizziness, feeling faint or lightheaded, blurry vision Prolonged or painful erection Side effects that usually do not require medical attention (report to your care team if they continue or are bothersome): Change in sex drive or performance Dizziness Headache Runny or stuffy nose This list may not describe all possible side effects. Call your doctor for medical advice about side effects. You may report side effects to FDA at 1-800-FDA-1088. Where should I keep my medication? Keep out of the reach of children. Store at room temperature between 15 and 30 degrees C (59 and 86 degrees F). Throw away any unused medication after the expiration date. NOTE: This sheet is a summary. It may not cover all possible information. If you have questions about this medicine, talk to your doctor, pharmacist, or health care provider.  2024 Elsevier/Gold Standard (2022-05-15 00:00:00)

## 2024-05-21 LAB — CBC WITH DIFFERENTIAL/PLATELET
Absolute Lymphocytes: 1608 {cells}/uL (ref 850–3900)
Absolute Monocytes: 562 {cells}/uL (ref 200–950)
Basophils Absolute: 29 {cells}/uL (ref 0–200)
Basophils Relative: 0.6 %
Eosinophils Absolute: 91 {cells}/uL (ref 15–500)
Eosinophils Relative: 1.9 %
HCT: 38.8 % (ref 38.5–50.0)
Hemoglobin: 12.6 g/dL — ABNORMAL LOW (ref 13.2–17.1)
MCH: 29.6 pg (ref 27.0–33.0)
MCHC: 32.5 g/dL (ref 32.0–36.0)
MCV: 91.1 fL (ref 80.0–100.0)
MPV: 9.8 fL (ref 7.5–12.5)
Monocytes Relative: 11.7 %
Neutro Abs: 2510 {cells}/uL (ref 1500–7800)
Neutrophils Relative %: 52.3 %
Platelets: 301 Thousand/uL (ref 140–400)
RBC: 4.26 Million/uL (ref 4.20–5.80)
RDW: 13.1 % (ref 11.0–15.0)
Total Lymphocyte: 33.5 %
WBC: 4.8 Thousand/uL (ref 3.8–10.8)

## 2024-05-21 LAB — COMPREHENSIVE METABOLIC PANEL WITH GFR
AG Ratio: 1 (calc) (ref 1.0–2.5)
ALT: 24 U/L (ref 9–46)
AST: 20 U/L (ref 10–35)
Albumin: 3.9 g/dL (ref 3.6–5.1)
Alkaline phosphatase (APISO): 117 U/L (ref 35–144)
BUN: 15 mg/dL (ref 7–25)
CO2: 27 mmol/L (ref 20–32)
Calcium: 9 mg/dL (ref 8.6–10.3)
Chloride: 103 mmol/L (ref 98–110)
Creat: 0.94 mg/dL (ref 0.70–1.22)
Globulin: 3.8 g/dL — ABNORMAL HIGH (ref 1.9–3.7)
Glucose, Bld: 110 mg/dL — ABNORMAL HIGH (ref 65–99)
Potassium: 4.3 mmol/L (ref 3.5–5.3)
Sodium: 137 mmol/L (ref 135–146)
Total Bilirubin: 0.5 mg/dL (ref 0.2–1.2)
Total Protein: 7.7 g/dL (ref 6.1–8.1)
eGFR: 80 mL/min/1.73m2 (ref >=60)

## 2024-05-21 LAB — LIPID PANEL
Cholesterol: 143 mg/dL (ref <200)
HDL: 51 mg/dL (ref >=40)
LDL Cholesterol (Calc): 79 mg/dL
Non-HDL Cholesterol (Calc): 92 mg/dL (ref <130)
Total CHOL/HDL Ratio: 2.8 (calc) (ref <5.0)
Triglycerides: 45 mg/dL (ref <150)

## 2024-05-21 LAB — PSA: PSA: 4.88 ng/mL — ABNORMAL HIGH (ref <=4.00)

## 2024-05-23 ENCOUNTER — Ambulatory Visit: Payer: Self-pay | Admitting: Internal Medicine

## 2024-05-31 ENCOUNTER — Ambulatory Visit: Payer: Self-pay

## 2024-05-31 ENCOUNTER — Other Ambulatory Visit: Payer: Self-pay | Admitting: Internal Medicine

## 2024-05-31 DIAGNOSIS — M059 Rheumatoid arthritis with rheumatoid factor, unspecified: Secondary | ICD-10-CM

## 2024-05-31 MED ORDER — PREDNISONE 10 MG PO TABS: ORAL_TABLET | ORAL | 0 refills | Status: AC

## 2024-05-31 NOTE — Telephone Encounter (Signed)
 FYI Only or Action Required?: Action required by provider: request for pain medication for Rheumatoid Arthritis. Please see nurse triage notes.  Patient was last seen in primary care on 05/20/2024 by Bernardo Fend, DO.  Called Nurse Triage reporting hand swelling.  Symptoms began unknown.  Interventions attempted: OTC medications: tylenol , steroids.  Symptoms are: unchanged.  Triage Disposition: Call PCP Within 24 Hours  Patient/caregiver understands and will follow disposition?: Yes    Copied from CRM [id number]. Topic: Clinical - Medication Question >> May 31, 2024  8:27 AM Myrick T wrote: Reason for CRM: patients wife called stated patient is unable to make a fist as his hands are swollen and painful. Please call in something for the pain. Reason for Disposition  [1] Follow-up call from patient regarding patient's clinical status AND [2] information NON-URGENT  Answer Assessment - Initial Assessment Questions 1. REASON FOR CALL or QUESTION: What is your reason for calling today? or How can I best     Patient's wife called in stating patient is in need of pain medication for his hands. Patient was seen in office on July 11 for his Rheumatoid Arthritis swelling and pain. Patient's wife states his pain is not controlled and she would like to have pain medication called in for him to the [facility] in [place] listed on pharmacy profile. PCP to advise.  2. CALLER: Document the source of call. (e.g., laboratory staff, caregiver or patient).     Patient's wife  Protocols used: PCP Call - No Triage-A-AH

## 2024-08-19 ENCOUNTER — Inpatient Hospital Stay: Attending: Oncology

## 2024-08-19 ENCOUNTER — Inpatient Hospital Stay (HOSPITAL_BASED_OUTPATIENT_CLINIC_OR_DEPARTMENT_OTHER): Admitting: Oncology

## 2024-08-19 ENCOUNTER — Encounter: Payer: Self-pay | Admitting: Oncology

## 2024-08-19 VITALS — BP 149/76 | HR 66 | Temp 96.2°F | Resp 18 | Wt 184.6 lb

## 2024-08-19 DIAGNOSIS — Z8042 Family history of malignant neoplasm of prostate: Secondary | ICD-10-CM | POA: Diagnosis not present

## 2024-08-19 DIAGNOSIS — D709 Neutropenia, unspecified: Secondary | ICD-10-CM

## 2024-08-19 DIAGNOSIS — Z87891 Personal history of nicotine dependence: Secondary | ICD-10-CM | POA: Diagnosis not present

## 2024-08-19 LAB — CBC WITH DIFFERENTIAL (CANCER CENTER ONLY)
Abs Immature Granulocytes: 0 K/uL (ref 0.00–0.07)
Basophils Absolute: 0 K/uL (ref 0.0–0.1)
Basophils Relative: 1 %
Eosinophils Absolute: 0.2 K/uL (ref 0.0–0.5)
Eosinophils Relative: 3 %
HCT: 35.9 % — ABNORMAL LOW (ref 39.0–52.0)
Hemoglobin: 12.2 g/dL — ABNORMAL LOW (ref 13.0–17.0)
Immature Granulocytes: 0 %
Lymphocytes Relative: 57 %
Lymphs Abs: 2.6 K/uL (ref 0.7–4.0)
MCH: 29.7 pg (ref 26.0–34.0)
MCHC: 34 g/dL (ref 30.0–36.0)
MCV: 87.3 fL (ref 80.0–100.0)
Monocytes Absolute: 0.5 K/uL (ref 0.1–1.0)
Monocytes Relative: 11 %
Neutro Abs: 1.3 K/uL — ABNORMAL LOW (ref 1.7–7.7)
Neutrophils Relative %: 28 %
Platelet Count: 307 K/uL (ref 150–400)
RBC: 4.11 MIL/uL — ABNORMAL LOW (ref 4.22–5.81)
RDW: 13.7 % (ref 11.5–15.5)
WBC Count: 4.5 K/uL (ref 4.0–10.5)
nRBC: 0 % (ref 0.0–0.2)

## 2024-08-19 NOTE — Assessment & Plan Note (Addendum)
 History of chronic neutropenia with baseline ANC around 1.  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   Labs are reviewed and discussed with patient. Neutropenia is mild, back to his baseline, 1.3.  Recommend observation.

## 2024-08-19 NOTE — Progress Notes (Signed)
 Hematology/Oncology Progress note [phone] [phone]        Patient Care Team: Bernardo Fend, DO as PCP - General (Internal Medicine) Mat Josem Stank, MD (Ophthalmology) Penne Olam Rana, NP as Nurse Practitioner (Rheumatology) Babara Call, MD as Consulting Physician (Oncology)  REASON FOR VISIT neutropenia.   ASSESSMENT & PLAN:   Neutropenia History of chronic neutropenia with baseline ANC around 1.  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   Labs are reviewed and discussed with patient. Neutropenia is mild, back to his baseline, 1.3.  Recommend observation.    Orders Placed This Encounter  Procedures   CBC with Differential (Cancer Center Only)    Standing Status:   Future    Expected Date:   08/19/2025    Expiration Date:   11/17/2025   CBC with Differential (Cancer Center Only)    Standing Status:   Future    Expected Date:   02/17/2025    Expiration Date:   05/18/2025   Follow up  Lab cbc - 6 months  Lab MD 1 year All questions were answered. The patient knows to call the clinic with any problems, questions or concerns.  Call Babara, MD, PhD [facility] Hematology Oncology 08/19/2024    HISTORY OF PRESENTING ILLNESS: [name] Foster 83 y.o. male with PMH listed as below who was referred by Dr.Geyer PCP to us  for evaluation of chronic neutropenia.  Patient was accompanied by his wife today. Patient denies any complaint he fails at his baseline health. Denies any night sweats or fever or weight loss. Denies any chest pain, shortness of breath, abdominal pain, or leg swelling. He denies any frequent upper respiratory infections. Patient had some recent labs done with [facility].  he has left shoulder pain for which he had seen PCP and was recommended to see orthopedic surgeon. patient declined. He was then advised to start shoulder exercise and be reassessed.  05/14/2017, hemoglobin 12.9 WBC 2.8 MCV  89.5 platelet 207. ANC 0.9Creatinine 1 bilirubin 0.6 AST 25 ALT 27, alkaline phosphatase In the past,his wbc has been chronically low, ranges from 12.9-13.3. ANC ranges from 0.9-1.9.  # Work up negative including pathology smear review, ANA, HIV, TSH, B12,  # his neutrophil counts was monitored weekly for the past few weeks. Patient's ANC ranges from 1.0-1.4. He has no signs of frequent infection. 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  Patient with above history reviewed by me today presents for re-establish care for neutropenia.  Patient was last seen by me on 07/19/2019.  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  He has had bone marrow biopsy done in August 2020 Trilineage hematopoiesis, hypercellular bone marrow for his age.  Polyclonal plasmacytosis likely reactive. There is no evidence of lymphoproliferative process.  Overall changes are generally nonspecific and may be related to patient's known history of rheumatoid arthritis.  Abundant iron stores. Jak 2 mutation analysis with reflex to CARL,MPL Jak exon 12-15 are negative.  He presents to re-establish care due to neutropenia.  Recent blood work was reviewed.  05/12/2023 wbc 3 , ANC 0.489 11/13/2023 wbc 3.3 ANC 0.739 12/04/2023 wbc 2.6, ANC 0.317  He denies any recurrent infection, recent antibiotics. He does not drink alcohol. No use of otc herbal supplementation.  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 Positive neutrophil antibodies.   INTERVAL HISTORY [name] E  Foster is a 83 y.o. male who has above history reviewed by me today presents for follow up visit for neutropenia.  No recent infection.  He has resumed Humira in April 2025 No new complaints.    Review of Systems  Constitutional:  Negative for appetite change, chills, diaphoresis, fatigue, fever and unexpected weight  change.  HENT:   Negative for hearing loss, lump/mass, nosebleeds and sore throat.   Eyes:  Negative for eye problems and icterus.  Respiratory:  Negative for chest tightness, cough, hemoptysis, shortness of breath and wheezing.   Cardiovascular:  Negative for chest pain and leg swelling.  Gastrointestinal:  Negative for abdominal distention, abdominal pain, blood in stool, diarrhea, nausea and rectal pain.  Endocrine: Negative for hot flashes.  Genitourinary:  Negative for bladder incontinence, difficulty urinating, dysuria, frequency, hematuria and nocturia.   Musculoskeletal:  Negative for arthralgias, back pain, flank pain, gait problem and myalgias.  Skin:  Negative for rash.  Neurological:  Negative for dizziness, gait problem, headaches, numbness and seizures.  Hematological:  Negative for adenopathy. Does not bruise/bleed easily.  Psychiatric/Behavioral:  Negative for confusion and decreased concentration. The patient is not nervous/anxious.     MEDICAL HISTORY: Past Medical History:  Diagnosis Date   Arthritis    BPH (benign prostatic hyperplasia)    Former smoker, stopped smoking many years ago    History of needle biopsy of prostate with negative result 2013   Hypertension    Prostate enlargement    Prostatic intraepithelial neoplasm III 2013    SURGICAL HISTORY: Past Surgical History:  Procedure Laterality Date   CATARACT EXTRACTION W/PHACO Right 06/17/2021   Procedure: CATARACT EXTRACTION PHACO AND INTRAOCULAR LENS PLACEMENT (IOC) RIGHT;  Surgeon: Myrna Adine Anes, MD;  Location: [facility];  Service: Ophthalmology;  Laterality: Right;  10.20 0:52.5   CATARACT EXTRACTION W/PHACO Left 08/05/2021   Procedure: CATARACT EXTRACTION PHACO AND INTRAOCULAR LENS PLACEMENT (IOC) LEFT;  Surgeon: Myrna Adine Anes, MD;  Location: [facility];  Service: Ophthalmology;  Laterality: Left;  4.21 00:31.2   ESOPHAGOGASTRODUODENOSCOPY N/A 06/26/2019   Procedure:  ESOPHAGOGASTRODUODENOSCOPY (EGD);  Surgeon: Unk Corinn Skiff, MD;  Location: [facility] END[facility];  Service: Gastroenterology;  Laterality: N/A;   HEMORROIDECTOMY     HERNIA REPAIR     JOINT REPLACEMENT Left    THR   PROSTATE BIOPSY  2013   TOTAL HIP ARTHROPLASTY Left    TOTAL HIP ARTHROPLASTY Right 04/26/2019   Procedure: TOTAL HIP ARTHROPLASTY ANTERIOR APPROACH;  Surgeon: Kathlynn Sharper, MD;  Location: [facility] ORS;  Service: Orthopedics;  Laterality: Right;    SOCIAL HISTORY: Social History   Socioeconomic History   Marital status: Married    Spouse name: Not on file   Number of children: 4   Years of education: Not on file   Highest education level: Not on file  Occupational History   Not on file  Tobacco Use   Smoking status: Former    Current packs/day: 0.00    Types: Cigarettes    Quit date: 06/04/1980    Years since quitting: 44.2   Smokeless tobacco: Never  Vaping Use   Vaping status: Never Used  Substance and Sexual Activity   Alcohol use: No   Drug use: No   Sexual activity: Yes  Other Topics Concern   Not on file  Social History Narrative   Not on file   Social Drivers of Health   Financial Resource Strain: Low Risk  (03/18/2024)   Overall Physicist, medical Strain (  CARDIA)    Difficulty of Paying Living Expenses: Not hard at all  Food Insecurity: No Food Insecurity (03/18/2024)   Hunger Vital Sign    Worried About Running Out of Food in the Last Year: Never true    Ran Out of Food in the Last Year: Never true  Transportation Needs: No Transportation Needs (03/18/2024)   PRAPARE - Administrator, Civil Service (Medical): No    Lack of Transportation (Non-Medical): No  Physical Activity: Sufficiently Active (03/18/2024)   Exercise Vital Sign    Days of Exercise per Week: 7 days    Minutes of Exercise per Session: 30 min  Stress: No Stress Concern Present (03/18/2024)   [employer] of Occupational Health - Occupational Stress Questionnaire    Feeling  of Stress : Not at all  Social Connections: Socially Integrated (03/18/2024)   Social Connection and Isolation Panel    Frequency of Communication with Friends and Family: More than three times a week    Frequency of Social Gatherings with Friends and Family: Twice a week    Attends Religious Services: More than 4 times per year    Active Member of [employer] or Organizations: Yes    Attends Banker Meetings: Never    Marital Status: Married  Catering manager Violence: Not At Risk (03/18/2024)   Humiliation, Afraid, Rape, and Kick questionnaire    Fear of Current or Ex-Partner: No    Emotionally Abused: No    Physically Abused: No    Sexually Abused: No    FAMILY HISTORY Family History  Problem Relation Age of Onset   Prostate cancer Father    Cancer Father    Prostate cancer Brother    Cancer Mother     ALLERGIES:  is allergic to methotrexate .  MEDICATIONS:  Current Outpatient Medications  Medication Sig Dispense Refill   HUMIRA PEN-PSOR/UVEIT STARTER 80 MG/0.8ML & 40MG /0.4ML PNKT      Multiple Vitamin (MULTIVITAMIN WITH MINERALS) TABS tablet Take 1 tablet by mouth daily.     No current facility-administered medications for this visit.    PHYSICAL EXAMINATION:  ECOG PERFORMANCE STATUS: 1 - Symptomatic but completely ambulatory   Vitals:   08/19/24 1017 08/19/24 1021  BP: (!) 179/83 (!) 149/76  Pulse: 66   Resp: 18   Temp: (!) 96.2 F (35.7 C)   SpO2: 100%     Filed Weights   08/19/24 1017  Weight: 184 lb 9.6 oz (83.7 kg)   Physical Exam Constitutional:      General: He is not in acute distress.    Appearance: He is not diaphoretic.  HENT:     Head: Normocephalic and atraumatic.  Eyes:     General: No scleral icterus. Cardiovascular:     Rate and Rhythm: Normal rate.  Pulmonary:     Effort: Pulmonary effort is normal. No respiratory distress.  Abdominal:     General: There is no distension.  Musculoskeletal:        General: Normal range of  motion.     Cervical back: Normal range of motion.  Skin:    Findings: No erythema or rash.  Neurological:     Mental Status: He is alert and oriented to person, place, and time.  Psychiatric:        Mood and Affect: Mood and affect normal.      LABORATORY DATA: I have personally reviewed the data as listed:  Appointment on 08/19/2024  Component Date Value Ref Range Status  WBC Count 08/19/2024 4.5  4.0 - 10.5 K/uL Final   RBC 08/19/2024 4.11 (L)  4.22 - 5.81 MIL/uL Final   Hemoglobin 08/19/2024 12.2 (L)  13.0 - 17.0 g/dL Final   HCT 89/89/7974 35.9 (L)  39.0 - 52.0 % Final   MCV 08/19/2024 87.3  80.0 - 100.0 fL Final   MCH 08/19/2024 29.7  26.0 - 34.0 pg Final   MCHC 08/19/2024 34.0  30.0 - 36.0 g/dL Final   RDW 89/89/7974 13.7  11.5 - 15.5 % Final   Platelet Count 08/19/2024 307  150 - 400 K/uL Final   nRBC 08/19/2024 0.0  0.0 - 0.2 % Final   Neutrophils Relative % 08/19/2024 28  % Final   Neutro Abs 08/19/2024 1.3 (L)  1.7 - 7.7 K/uL Final   Lymphocytes Relative 08/19/2024 57  % Final   Lymphs Abs 08/19/2024 2.6  0.7 - 4.0 K/uL Final   Monocytes Relative 08/19/2024 11  % Final   Monocytes Absolute 08/19/2024 0.5  0.1 - 1.0 K/uL Final   Eosinophils Relative 08/19/2024 3  % Final   Eosinophils Absolute 08/19/2024 0.2  0.0 - 0.5 K/uL Final   Basophils Relative 08/19/2024 1  % Final   Basophils Absolute 08/19/2024 0.0  0.0 - 0.1 K/uL Final   Immature Granulocytes 08/19/2024 0  % Final   Abs Immature Granulocytes 08/19/2024 0.00  0.00 - 0.07 K/uL Final   Performed at [facility], [address] [place], [place] [address]    Positive neutrophil antibodies.  07/29/2018 Flowcytometry showed no significant diagnostic immunophenotypic abnormality. 10% large granular lymphocytes.  RADIOGRAPHIC STUDIES: I have personally reviewed the radiological images as listed and agreed with the findings in the report. No results found.

## 2024-09-01 DIAGNOSIS — H15033 Posterior scleritis, bilateral: Secondary | ICD-10-CM | POA: Diagnosis not present

## 2024-09-01 DIAGNOSIS — M0579 Rheumatoid arthritis with rheumatoid factor of multiple sites without organ or systems involvement: Secondary | ICD-10-CM | POA: Diagnosis not present

## 2024-09-01 DIAGNOSIS — H35363 Drusen (degenerative) of macula, bilateral: Secondary | ICD-10-CM | POA: Diagnosis not present

## 2024-09-01 DIAGNOSIS — H35052 Retinal neovascularization, unspecified, left eye: Secondary | ICD-10-CM | POA: Diagnosis not present

## 2024-09-01 DIAGNOSIS — Z79899 Other long term (current) drug therapy: Secondary | ICD-10-CM | POA: Diagnosis not present

## 2024-09-01 DIAGNOSIS — H43813 Vitreous degeneration, bilateral: Secondary | ICD-10-CM | POA: Diagnosis not present

## 2024-09-01 DIAGNOSIS — D709 Neutropenia, unspecified: Secondary | ICD-10-CM | POA: Diagnosis not present

## 2024-11-18 ENCOUNTER — Encounter: Payer: Self-pay | Admitting: Oncology

## 2024-11-25 ENCOUNTER — Ambulatory Visit (INDEPENDENT_AMBULATORY_CARE_PROVIDER_SITE_OTHER): Admitting: Internal Medicine

## 2024-11-25 ENCOUNTER — Encounter: Payer: Self-pay | Admitting: Internal Medicine

## 2024-11-25 ENCOUNTER — Other Ambulatory Visit: Payer: Self-pay

## 2024-11-25 VITALS — BP 132/76 | HR 71 | Temp 97.8°F | Resp 16 | Ht 68.0 in | Wt 189.6 lb

## 2024-11-25 DIAGNOSIS — D649 Anemia, unspecified: Secondary | ICD-10-CM

## 2024-11-25 DIAGNOSIS — D709 Neutropenia, unspecified: Secondary | ICD-10-CM

## 2024-11-25 DIAGNOSIS — R300 Dysuria: Secondary | ICD-10-CM

## 2024-11-25 DIAGNOSIS — Z87898 Personal history of other specified conditions: Secondary | ICD-10-CM

## 2024-11-25 DIAGNOSIS — M059 Rheumatoid arthritis with rheumatoid factor, unspecified: Secondary | ICD-10-CM

## 2024-11-25 DIAGNOSIS — L819 Disorder of pigmentation, unspecified: Secondary | ICD-10-CM

## 2024-11-25 LAB — POCT URINALYSIS DIPSTICK
Bilirubin, UA: NEGATIVE
Glucose, UA: NEGATIVE
Ketones, UA: NEGATIVE
Leukocytes, UA: NEGATIVE
Nitrite, UA: NEGATIVE
Protein, UA: POSITIVE — AB
Spec Grav, UA: 1.02
Urobilinogen, UA: NEGATIVE U/dL — AB
pH, UA: 5

## 2024-11-25 NOTE — Progress Notes (Signed)
 "  Established Patient Office Visit  Subjective   Patient ID: [name] Foster, male    DOB: [birth year]  Age: 84 y.o. MRN: [id number]  Chief Complaint  Patient presents with   Medical Management of Chronic Issues    6 month recheck    [name] Foster presents for follow up on chronic medical conditions. He is here with his wife today.   Discussed the use of AI scribe software for clinical note transcription with the patient, who gave verbal consent to proceed.  History of Present Illness  [name] Foster is an 84 year old male who presents with a burning sensation during urination.  He has had burning with urination for 3 days with increased urinary frequency and urgency. He denies hematuria, lower abdominal pain, flank pain, or back pain.  He has anemia with recent hemoglobin as low as 12.2 and is on Humira.  He has a low neutrophil count and was evaluated by hematology to rule out other causes.  He has a recurrent lesion on his back that sometimes bleeds spontaneously. It is not currently painful or itchy.   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: -Back on Humira  -Following with Rheumatology and Ophthalmology  Neutropenia: -Was evaluated by Hematology, thinking this is due to Humira  -Last CBC 10/25 - WBC 4.5, abs neutrophils 1.3, hemoglobin 12.2  Atopic Dermatitis: -No longer on steroid cream, following with Dermatology  COPD/Emphysema: -COPD status: controlled -Current medications: None -Dyspnea frequency: rarely  -Cough frequency: rare -Limitation of activity: no -Continues to decline flu and pneumonia vaccines, discussed RSV vaccine as well   Elevated PSA: -PSA 7/25 4.88 -Has mild BPH symptoms -He had seen Urology in the past for this. He does have a family history of prostate cancer in his father and brother.  -Endorsing dysuria today, going on about 3 days but no other urinary symptoms   Health Maintenance: -Blood work due -Patient  continues to decline flu and pneumonia vaccines  Patient Active Problem List   Diagnosis Date Noted   Neutropenia 12/11/2023   History of elevated PSA 11/13/2023   Nocturia 11/13/2023   Family history of prostate cancer 11/13/2023   Olecranon bursitis of both elbows 04/11/2020   Tinea 04/11/2020   Episcleritis of both eyes 02/28/2020   History of malnutrition 02/28/2020   History of depression 02/28/2020   Protein-calorie malnutrition, severe 06/26/2019   Normocytic anemia 06/24/2019   Thrombocytosis 06/24/2019   Dysphagia 06/24/2019   Palliative care by specialist 06/02/2019   Iron deficiency anemia 06/02/2019   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   Generalized weakness 06/02/2019   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   Pneumonia 05/25/2019   Sepsis (HCC)    Goals of care, counseling/discussion    DNR (do not resuscitate) discussion    Atypical pneumonia    H/O bilateral hip replacements 04/26/2019   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   Incomplete emptying of bladder 02/03/2017   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   Abnormal finding on chest xray 07/18/2013   Chronic cough 07/18/2013   Tests ordered 09/03/2012   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   Benign localized prostatic hyperplasia with lower urinary tract symptoms (LUTS) 07/07/2012   Elevated prostate specific antigen (PSA) 07/07/2012   Past Medical History:  Diagnosis Date   Arthritis    BPH (benign prostatic hyperplasia)    Former smoker, stopped smoking many years ago    History of needle biopsy  of prostate with negative result 2013   Hypertension    Prostate enlargement    Prostatic intraepithelial neoplasm III 2013   Past Surgical History:  Procedure Laterality Date   CATARACT EXTRACTION W/PHACO Right 06/17/2021   Procedure: CATARACT EXTRACTION PHACO AND INTRAOCULAR LENS PLACEMENT (IOC) RIGHT;  Surgeon:  Myrna Adine Anes, MD;  Location: [facility];  Service: Ophthalmology;  Laterality: Right;  10.20 0:52.5   CATARACT EXTRACTION W/PHACO Left 08/05/2021   Procedure: CATARACT EXTRACTION PHACO AND INTRAOCULAR LENS PLACEMENT (IOC) LEFT;  Surgeon: Myrna Adine Anes, MD;  Location: [facility];  Service: Ophthalmology;  Laterality: Left;  4.21 00:31.2   ESOPHAGOGASTRODUODENOSCOPY N/A 06/26/2019   Procedure: ESOPHAGOGASTRODUODENOSCOPY (EGD);  Surgeon: Unk Corinn Skiff, MD;  Location: [facility] ENDOSCOPY;  Service: Gastroenterology;  Laterality: N/A;   HEMORROIDECTOMY     HERNIA REPAIR     JOINT REPLACEMENT Left    THR   PROSTATE BIOPSY  2013   TOTAL HIP ARTHROPLASTY Left    TOTAL HIP ARTHROPLASTY Right 04/26/2019   Procedure: TOTAL HIP ARTHROPLASTY ANTERIOR APPROACH;  Surgeon: Kathlynn Sharper, MD;  Location: [facility] ORS;  Service: Orthopedics;  Laterality: Right;   Social History   Tobacco Use   Smoking status: Former    Current packs/day: 0.00    Types: Cigarettes    Quit date: 06/04/1980    Years since quitting: 44.5   Smokeless tobacco: Never  Vaping Use   Vaping status: Never Used  Substance Use Topics   Alcohol use: No   Drug use: No   Social History   Socioeconomic History   Marital status: Married    Spouse name: Not on file   Number of children: 4   Years of education: Not on file   Highest education level: Not on file  Occupational History   Not on file  Tobacco Use   Smoking status: Former    Current packs/day: 0.00    Types: Cigarettes    Quit date: 06/04/1980    Years since quitting: 44.5   Smokeless tobacco: Never  Vaping Use   Vaping status: Never Used  Substance and Sexual Activity   Alcohol use: No   Drug use: No   Sexual activity: Yes  Other Topics Concern   Not on file  Social History Narrative   Not on file   Social Drivers of Health   Tobacco Use: Medium Risk (11/25/2024)   Patient History    Smoking Tobacco Use: Former    Smokeless  Tobacco Use: Never    Passive Exposure: Not on Actuary Strain: Low Risk (03/18/2024)   Overall Financial Resource Strain (CARDIA)    Difficulty of Paying Living Expenses: Not hard at all  Food Insecurity: No Food Insecurity (03/18/2024)   Hunger Vital Sign    Worried About Running Out of Food in the Last Year: Never true    Ran Out of Food in the Last Year: Never true  Transportation Needs: No Transportation Needs (03/18/2024)   PRAPARE - Administrator, Civil Service (Medical): No    Lack of Transportation (Non-Medical): No  Physical Activity: Sufficiently Active (03/18/2024)   Exercise Vital Sign    Days of Exercise per Week: 7 days    Minutes of Exercise per Session: 30 min  Stress: No Stress Concern Present (03/18/2024)   [employer] of Occupational Health - Occupational Stress Questionnaire    Feeling of Stress : Not at all  Social Connections: Socially Integrated (03/18/2024)  Social Advertising Account Executive    Frequency of Communication with Friends and Family: More than three times a week    Frequency of Social Gatherings with Friends and Family: Twice a week    Attends Religious Services: More than 4 times per year    Active Member of [employer] or Organizations: Yes    Attends Banker Meetings: Never    Marital Status: Married  Catering Manager Violence: Not At Risk (03/18/2024)   Humiliation, Afraid, Rape, and Kick questionnaire    Fear of Current or Ex-Partner: No    Emotionally Abused: No    Physically Abused: No    Sexually Abused: No  Depression (PHQ2-9): Low Risk (03/18/2024)   Depression (PHQ2-9)    PHQ-2 Score: 0  Alcohol Screen: Low Risk (03/18/2024)   Alcohol Screen    Last Alcohol Screening Score (AUDIT): 0  Housing: Low Risk (03/18/2024)   Housing Stability Vital Sign    Unable to Pay for Housing in the Last Year: No    Number of Times Moved in the Last Year: 0    Homeless in the Last Year: No  Utilities: Not At Risk  (03/18/2024)   AHC Utilities    Threatened with loss of utilities: No  Health Literacy: Adequate Health Literacy (03/18/2024)   B1300 Health Literacy    Frequency of need for help with medical instructions: Never   Family Status  Relation Name Status   Father  Deceased   MGM  Alive   Brother  (Not Specified)   Mother  Deceased  No partnership data on file   Family History  Problem Relation Age of Onset   Prostate cancer Father    Cancer Father    Prostate cancer Brother    Cancer Mother    Allergies  Allergen Reactions   Methotrexate      Other reaction(s): Weakness,       Review of Systems  Gastrointestinal:  Negative for abdominal pain.  Genitourinary:  Positive for dysuria. Negative for flank pain, frequency, hematuria and urgency.  All other systems reviewed and are negative.     Objective:     BP 132/76 (Cuff Size: Large)   Pulse 71   Temp 97.8 F (36.6 C) (Oral)   Resp 16   Ht 5' 8 (1.727 m)   Wt 189 lb 9.6 oz (86 kg)   SpO2 96%   BMI 28.83 kg/m  BP Readings from Last 3 Encounters:  11/25/24 132/76  08/19/24 (!) 149/76  05/20/24 122/70   Wt Readings from Last 3 Encounters:  11/25/24 189 lb 9.6 oz (86 kg)  08/19/24 184 lb 9.6 oz (83.7 kg)  05/20/24 184 lb 1.6 oz (83.5 kg)      Physical Exam Constitutional:      Appearance: Normal appearance.  HENT:     Head: Normocephalic and atraumatic.     Mouth/Throat:     Mouth: Mucous membranes are moist.     Comments: Mild PND Eyes:     Conjunctiva/sclera: Conjunctivae normal.     Comments: Cataracts bilaterally   Cardiovascular:     Rate and Rhythm: Normal rate and regular rhythm.  Pulmonary:     Effort: Pulmonary effort is normal.     Breath sounds: Normal breath sounds.  Musculoskeletal:     Right lower leg: No edema.     Left lower leg: No edema.  Skin:    General: Skin is warm and dry.     Comments: Lesion on back  scabbed over on back, no signs of infection or inflammation, picture below   Neurological:     General: No focal deficit present.     Mental Status: He is alert. Mental status is at baseline.  Psychiatric:        Mood and Affect: Mood normal.        Behavior: Behavior normal.      No results found for any visits on 11/25/24.  Last CBC Lab Results  Component Value Date   WBC 4.5 08/19/2024   HGB 12.2 (L) 08/19/2024   HCT 35.9 (L) 08/19/2024   MCV 87.3 08/19/2024   MCH 29.7 08/19/2024   RDW 13.7 08/19/2024   PLT 307 08/19/2024   Last metabolic panel Lab Results  Component Value Date   GLUCOSE 110 (H) 05/20/2024   NA 137 05/20/2024   K 4.3 05/20/2024   CL 103 05/20/2024   CO2 27 05/20/2024   BUN 15 05/20/2024   CREATININE 0.94 05/20/2024   EGFR 80 05/20/2024   CALCIUM 9.0 05/20/2024   PHOS 2.8 06/29/2019   PROT 7.7 05/20/2024   ALBUMIN 4.0 12/11/2023   LABGLOB 3.8 12/11/2023   BILITOT 0.5 05/20/2024   ALKPHOS 83 12/11/2023   AST 20 05/20/2024   ALT 24 05/20/2024   ANIONGAP 7 12/11/2023   Last lipids Lab Results  Component Value Date   CHOL 143 05/20/2024   HDL 51 05/20/2024   LDLCALC 79 05/20/2024   TRIG 45 05/20/2024   CHOLHDL 2.8 05/20/2024   Last hemoglobin A1c No results found for: HGBA1C Last thyroid  functions Lab Results  Component Value Date   TSH 4.289 05/26/2019   Last vitamin D No results found for: 25OHVITD2, 25OHVITD3, VD25OH Last vitamin B12 and Folate Lab Results  Component Value Date   VITAMINB12 462 12/11/2023   FOLATE >40.0 12/11/2023      The ASCVD Risk score (Arnett DK, et al., 2019) failed to calculate for the following reasons:   The 2019 ASCVD risk score is only valid for ages 28 to 77   * - Cholesterol units were assumed    Assessment & Plan:   Assessment & Plan  Evaluation for urinary tract infection Dysuria with increased frequency and urgency. Differential includes UTI. - Obtained urine sample for urinalysis and culture. UA negative for leukocytes or nitrates but large amount of  blood, sent for culture.  - Advised increased water  intake.  Chronic mild anemia/Neutropenia Hemoglobin levels between 12.2 and 13.1 over the past year. Current level 12.2, consistent with baseline. No immediate concern if stable. - Repeated CBC to monitor hemoglobin levels. Added ferritin panel.  - Recheck neutrophils as well.   Evaluation of persistent skin lesion on back Persistent lesion with occasional bleeding. Differential includes benign lesion versus potential skin cancer. Previous antibiotics reduced size but lesion persists. - Referred to dermatologist for evaluation and potential removal. - Took photograph of lesion for dermatology referral.  History of Elevated PSA - Recheck today with labs.  RA - Continue Humira, recheck CBC. Still following with Rheumatology.    - Ambulatory referral to Dermatology - CBC w/Diff/Platelet - PSA - Fe+TIBC+Fer - POCT Urinalysis Dipstick - Urine Culture   Return in about 6 months (around 05/25/2025).    Sharyle Fischer, DO "

## 2024-11-26 LAB — URINE CULTURE
MICRO NUMBER:: 17480100
SPECIMEN QUALITY:: ADEQUATE

## 2024-11-26 LAB — CBC WITH DIFFERENTIAL/PLATELET
Absolute Lymphocytes: 1957 {cells}/uL (ref 850–3900)
Absolute Monocytes: 327 {cells}/uL (ref 200–950)
Basophils Absolute: 30 {cells}/uL (ref 0–200)
Basophils Relative: 0.9 %
Eosinophils Absolute: 178 {cells}/uL (ref 15–500)
Eosinophils Relative: 5.4 %
HCT: 38.4 % — ABNORMAL LOW (ref 39.4–51.1)
Hemoglobin: 13 g/dL — ABNORMAL LOW (ref 13.2–17.1)
MCH: 30 pg (ref 27.0–33.0)
MCHC: 33.9 g/dL (ref 31.6–35.4)
MCV: 88.5 fL (ref 81.4–101.7)
MPV: 10.6 fL (ref 7.5–12.5)
Monocytes Relative: 9.9 %
Neutro Abs: 809 {cells}/uL — ABNORMAL LOW (ref 1500–7800)
Neutrophils Relative %: 24.5 %
Platelets: 279 Thousand/uL (ref 140–400)
RBC: 4.34 Million/uL (ref 4.20–5.80)
RDW: 14.7 % (ref 11.0–15.0)
Total Lymphocyte: 59.3 %
WBC: 3.3 Thousand/uL — ABNORMAL LOW (ref 3.8–10.8)

## 2024-11-26 LAB — IRON,TIBC AND FERRITIN PANEL
%SAT: 33 % (ref 20–48)
Ferritin: 77 ng/mL (ref 24–380)
Iron: 94 ug/dL (ref 50–180)
TIBC: 286 ug/dL (ref 250–425)

## 2024-11-26 LAB — PSA: PSA: 5.06 ng/mL — ABNORMAL HIGH

## 2024-11-28 ENCOUNTER — Ambulatory Visit: Payer: Self-pay | Admitting: Internal Medicine

## 2024-12-05 ENCOUNTER — Ambulatory Visit: Admitting: Dermatology

## 2024-12-12 ENCOUNTER — Ambulatory Visit: Admitting: Dermatology

## 2024-12-22 ENCOUNTER — Ambulatory Visit: Admitting: Dermatology

## 2025-02-17 ENCOUNTER — Other Ambulatory Visit

## 2025-04-06 ENCOUNTER — Ambulatory Visit

## 2025-05-26 ENCOUNTER — Ambulatory Visit: Admitting: Internal Medicine

## 2025-08-18 ENCOUNTER — Other Ambulatory Visit

## 2025-08-18 ENCOUNTER — Ambulatory Visit: Admitting: Oncology
